# Patient Record
Sex: Male | Born: 1940 | Race: Black or African American | Hispanic: No | Smoking: Former smoker
Health system: Southern US, Community
[De-identification: ages and names within clinical notes are randomized; demographics above are authoritative.]

## PROBLEM LIST (undated history)

## (undated) DIAGNOSIS — E785 Hyperlipidemia, unspecified: Secondary | ICD-10-CM

## (undated) DIAGNOSIS — E119 Type 2 diabetes mellitus without complications: Secondary | ICD-10-CM

## (undated) DIAGNOSIS — C61 Malignant neoplasm of prostate: Secondary | ICD-10-CM

## (undated) DIAGNOSIS — A048 Other specified bacterial intestinal infections: Secondary | ICD-10-CM

## (undated) DIAGNOSIS — I517 Cardiomegaly: Secondary | ICD-10-CM

## (undated) DIAGNOSIS — Z923 Personal history of irradiation: Secondary | ICD-10-CM

## (undated) DIAGNOSIS — M25519 Pain in unspecified shoulder: Secondary | ICD-10-CM

## (undated) DIAGNOSIS — K469 Unspecified abdominal hernia without obstruction or gangrene: Secondary | ICD-10-CM

## (undated) DIAGNOSIS — I10 Essential (primary) hypertension: Secondary | ICD-10-CM

## (undated) DIAGNOSIS — N259 Disorder resulting from impaired renal tubular function, unspecified: Secondary | ICD-10-CM

## (undated) HISTORY — DX: Pain in unspecified shoulder: M25.519

## (undated) HISTORY — DX: Cardiomegaly: I51.7

## (undated) HISTORY — DX: Essential (primary) hypertension: I10

## (undated) HISTORY — DX: Disorder resulting from impaired renal tubular function, unspecified: N25.9

## (undated) HISTORY — DX: Hyperlipidemia, unspecified: E78.5

## (undated) HISTORY — DX: Unspecified abdominal hernia without obstruction or gangrene: K46.9

---
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
## 1993-06-20 HISTORY — PX: OTHER SURGICAL HISTORY: SHX169

## 1997-06-20 HISTORY — PX: FOOT SURGERY: SHX648

## 2001-08-21 ENCOUNTER — Encounter: Payer: Self-pay | Admitting: General Surgery

## 2001-08-21 ENCOUNTER — Ambulatory Visit (HOSPITAL_COMMUNITY): Admission: RE | Admit: 2001-08-21 | Discharge: 2001-08-21 | Payer: Self-pay | Admitting: General Surgery

## 2003-05-06 HISTORY — PX: OTHER SURGICAL HISTORY: SHX169

## 2006-07-18 ENCOUNTER — Ambulatory Visit: Payer: Self-pay | Admitting: Internal Medicine

## 2006-07-18 LAB — CONVERTED CEMR LAB
ALT: 33 units/L (ref 0–40)
AST: 32 units/L (ref 0–37)
Albumin: 4.3 g/dL (ref 3.5–5.2)
Alkaline Phosphatase: 72 units/L (ref 39–117)
BUN: 16 mg/dL (ref 6–23)
Bacteria, UA: NEGATIVE
Basophils Absolute: 0.1 10*3/uL (ref 0.0–0.1)
Basophils Relative: 0.6 % (ref 0.0–1.0)
Bilirubin Urine: NEGATIVE
Bilirubin, Direct: 0.1 mg/dL (ref 0.0–0.3)
CO2: 28 meq/L (ref 19–32)
Calcium: 9.7 mg/dL (ref 8.4–10.5)
Chloride: 108 meq/L (ref 96–112)
Cholesterol: 232 mg/dL (ref 0–200)
Creatinine, Ser: 1.3 mg/dL (ref 0.4–1.5)
Direct LDL: 164.3 mg/dL
Eosinophils Absolute: 0.2 10*3/uL (ref 0.0–0.6)
Eosinophils Relative: 2.1 % (ref 0.0–5.0)
GFR calc Af Amer: 71 mL/min
GFR calc non Af Amer: 59 mL/min
Glucose, Bld: 96 mg/dL (ref 70–99)
HCT: 45.9 % (ref 39.0–52.0)
HDL: 42.6 mg/dL (ref >39.0)
Hemoglobin: 15.9 g/dL (ref 13.0–17.0)
Ketones, ur: NEGATIVE mg/dL
Leukocytes, UA: NEGATIVE
Lymphocytes Relative: 23 % (ref 12.0–46.0)
MCHC: 34.6 g/dL (ref 30.0–36.0)
MCV: 93.5 fL (ref 78.0–100.0)
Monocytes Absolute: 0.8 10*3/uL — ABNORMAL HIGH (ref 0.2–0.7)
Monocytes Relative: 7.8 % (ref 3.0–11.0)
Mucus, UA: NEGATIVE
Neutro Abs: 7.1 10*3/uL (ref 1.4–7.7)
Neutrophils Relative %: 66.5 % (ref 43.0–77.0)
Nitrite: NEGATIVE
PSA: 2.35 ng/mL (ref 0.10–4.00)
Platelets: 252 10*3/uL (ref 150–400)
Potassium: 4.6 meq/L (ref 3.5–5.1)
RBC: 4.91 M/uL (ref 4.22–5.81)
RDW: 13.4 % (ref 11.5–14.6)
Sodium: 142 meq/L (ref 135–145)
Specific Gravity, Urine: >=1.03 (ref 1.000–1.03)
TSH: 1.17 microintl units/mL (ref 0.35–5.50)
Total Bilirubin: 0.6 mg/dL (ref 0.3–1.2)
Total CHOL/HDL Ratio: 5.4
Total Protein, Urine: NEGATIVE mg/dL
Total Protein: 7.1 g/dL (ref 6.0–8.3)
Triglycerides: 144 mg/dL (ref 0–149)
Urine Glucose: NEGATIVE mg/dL
Urobilinogen, UA: 0.2 (ref 0.0–1.0)
VLDL: 29 mg/dL (ref 0–40)
WBC: 10.7 10*3/uL — ABNORMAL HIGH (ref 4.5–10.5)
pH: 5 (ref 5.0–8.0)

## 2006-09-04 ENCOUNTER — Ambulatory Visit (HOSPITAL_COMMUNITY): Admission: RE | Admit: 2006-09-04 | Discharge: 2006-09-04 | Payer: Self-pay | Admitting: Endocrinology

## 2006-09-04 ENCOUNTER — Ambulatory Visit: Payer: Self-pay | Admitting: Endocrinology

## 2007-03-05 ENCOUNTER — Encounter: Payer: Self-pay | Admitting: Endocrinology

## 2007-03-05 DIAGNOSIS — I10 Essential (primary) hypertension: Secondary | ICD-10-CM

## 2007-03-05 DIAGNOSIS — E785 Hyperlipidemia, unspecified: Secondary | ICD-10-CM

## 2007-03-05 DIAGNOSIS — K469 Unspecified abdominal hernia without obstruction or gangrene: Secondary | ICD-10-CM

## 2007-03-05 HISTORY — DX: Essential (primary) hypertension: I10

## 2007-03-05 HISTORY — DX: Hyperlipidemia, unspecified: E78.5

## 2007-03-05 HISTORY — DX: Unspecified abdominal hernia without obstruction or gangrene: K46.9

## 2008-04-28 ENCOUNTER — Encounter: Payer: Self-pay | Admitting: Endocrinology

## 2008-04-28 ENCOUNTER — Ambulatory Visit: Payer: Self-pay | Admitting: Endocrinology

## 2008-04-28 DIAGNOSIS — R9431 Abnormal electrocardiogram [ECG] [EKG]: Secondary | ICD-10-CM

## 2008-05-06 ENCOUNTER — Encounter: Payer: Self-pay | Admitting: Endocrinology

## 2008-05-06 ENCOUNTER — Ambulatory Visit: Payer: Self-pay

## 2008-05-08 ENCOUNTER — Ambulatory Visit: Payer: Self-pay | Admitting: Endocrinology

## 2008-05-08 DIAGNOSIS — I517 Cardiomegaly: Secondary | ICD-10-CM

## 2008-05-08 DIAGNOSIS — M25519 Pain in unspecified shoulder: Secondary | ICD-10-CM

## 2008-05-08 HISTORY — DX: Pain in unspecified shoulder: M25.519

## 2008-05-08 HISTORY — DX: Cardiomegaly: I51.7

## 2008-05-11 LAB — CONVERTED CEMR LAB
ALT: 29 units/L (ref 0–53)
AST: 24 units/L (ref 0–37)
Albumin: 4.5 g/dL (ref 3.5–5.2)
Alkaline Phosphatase: 64 units/L (ref 39–117)
BUN: 28 mg/dL — ABNORMAL HIGH (ref 6–23)
Basophils Absolute: 0 10*3/uL (ref 0.0–0.1)
Basophils Relative: 0.3 % (ref 0.0–3.0)
Bilirubin Urine: NEGATIVE
Bilirubin, Direct: 0.1 mg/dL (ref 0.0–0.3)
CO2: 29 meq/L (ref 19–32)
Calcium: 10.3 mg/dL (ref 8.4–10.5)
Chloride: 103 meq/L (ref 96–112)
Cholesterol: 253 mg/dL (ref 0–200)
Creatinine, Ser: 2 mg/dL — ABNORMAL HIGH (ref 0.4–1.5)
Crystals: NEGATIVE
Direct LDL: 152.2 mg/dL
Eosinophils Absolute: 0.2 10*3/uL (ref 0.0–0.7)
Eosinophils Relative: 2.6 % (ref 0.0–5.0)
GFR calc Af Amer: 43 mL/min
GFR calc non Af Amer: 36 mL/min
Glucose, Bld: 104 mg/dL — ABNORMAL HIGH (ref 70–99)
HCT: 47.8 % (ref 39.0–52.0)
HDL: 40.8 mg/dL (ref >39.0)
Hemoglobin: 16.5 g/dL (ref 13.0–17.0)
Ketones, ur: NEGATIVE mg/dL
Leukocytes, UA: NEGATIVE
Lymphocytes Relative: 31.8 % (ref 12.0–46.0)
MCHC: 34.4 g/dL (ref 30.0–36.0)
MCV: 94.6 fL (ref 78.0–100.0)
Monocytes Absolute: 0.6 10*3/uL (ref 0.1–1.0)
Monocytes Relative: 7.6 % (ref 3.0–12.0)
Neutro Abs: 5 10*3/uL (ref 1.4–7.7)
Neutrophils Relative %: 57.7 % (ref 43.0–77.0)
Nitrite: NEGATIVE
PSA: 3.73 ng/mL (ref 0.10–4.00)
Platelets: 237 10*3/uL (ref 150–400)
Potassium: 5.1 meq/L (ref 3.5–5.1)
RBC: 5.05 M/uL (ref 4.22–5.81)
RDW: 12.9 % (ref 11.5–14.6)
Sodium: 142 meq/L (ref 135–145)
Specific Gravity, Urine: >=1.03 (ref 1.000–1.03)
TSH: 1.47 microintl units/mL (ref 0.35–5.50)
Total Bilirubin: 0.8 mg/dL (ref 0.3–1.2)
Total CHOL/HDL Ratio: 6.2
Total Protein, Urine: NEGATIVE mg/dL
Total Protein: 7.4 g/dL (ref 6.0–8.3)
Triglycerides: 189 mg/dL — ABNORMAL HIGH (ref 0–149)
Urine Glucose: NEGATIVE mg/dL
Urobilinogen, UA: 0.2 (ref 0.0–1.0)
VLDL: 38 mg/dL (ref 0–40)
WBC: 8.5 10*3/uL (ref 4.5–10.5)
pH: 5.5 (ref 5.0–8.0)

## 2008-05-22 ENCOUNTER — Ambulatory Visit: Payer: Self-pay | Admitting: Endocrinology

## 2008-05-22 DIAGNOSIS — N259 Disorder resulting from impaired renal tubular function, unspecified: Secondary | ICD-10-CM

## 2008-05-22 HISTORY — DX: Disorder resulting from impaired renal tubular function, unspecified: N25.9

## 2008-05-22 LAB — CONVERTED CEMR LAB
BUN: 24 mg/dL — ABNORMAL HIGH (ref 6–23)
CO2: 29 meq/L (ref 19–32)
Calcium: 9.9 mg/dL (ref 8.4–10.5)
Chloride: 107 meq/L (ref 96–112)
Creatinine, Ser: 1.7 mg/dL — ABNORMAL HIGH (ref 0.4–1.5)
GFR calc Af Amer: 52 mL/min
GFR calc non Af Amer: 43 mL/min
Glucose, Bld: 120 mg/dL — ABNORMAL HIGH (ref 70–99)
Potassium: 5.1 meq/L (ref 3.5–5.1)
Sodium: 141 meq/L (ref 135–145)

## 2008-06-05 ENCOUNTER — Telehealth (INDEPENDENT_AMBULATORY_CARE_PROVIDER_SITE_OTHER): Payer: Self-pay | Admitting: *Deleted

## 2008-06-26 ENCOUNTER — Ambulatory Visit: Payer: Self-pay | Admitting: Internal Medicine

## 2008-07-10 ENCOUNTER — Encounter: Payer: Self-pay | Admitting: Internal Medicine

## 2008-07-10 ENCOUNTER — Ambulatory Visit: Payer: Self-pay | Admitting: Internal Medicine

## 2008-07-10 LAB — HM COLONOSCOPY: HM Colonoscopy: NORMAL

## 2008-07-12 ENCOUNTER — Encounter: Payer: Self-pay | Admitting: Internal Medicine

## 2010-07-20 NOTE — Miscellaneous (Signed)
Summary: DIR COLS CR-AGE.Marland KitchenEM  Clinical Lists Changes  Medications: Added new medication of MOVIPREP 100 GM  SOLR (PEG-KCL-NACL-NASULF-NA ASC-C) As per prep instructions. - Signed Rx of MOVIPREP 100 GM  SOLR (PEG-KCL-NACL-NASULF-NA ASC-C) As per prep instructions.;  #1 x 0;  Signed;  Entered by: Laverna Peace RN;  Authorized by: Irene Shipper MD;  Method used: Electronically to [facility] [address] [place], [place], [place], [place]  [address], Ph: [phone], Fax: [phone] Observations: Added new observation of ALLERGY REV: Done (06/26/2008 10:13) Added new observation of NKA: T (06/26/2008 10:13)    Prescriptions: MOVIPREP 100 GM  SOLR (PEG-KCL-NACL-NASULF-NA ASC-C) As per prep instructions.  #1 x 0   Entered by:   Laverna Peace RN   Authorized by:   Irene Shipper MD   Signed by:   Laverna Peace RN on 06/26/2008   Method used:   Electronically to        [facility] (retail)       [address] [place], [place]  [address]       Ph: [phone]       Fax: [phone]   RxID:   [id number]

## 2010-08-16 ENCOUNTER — Other Ambulatory Visit: Payer: Self-pay | Admitting: Endocrinology

## 2010-08-16 ENCOUNTER — Encounter: Payer: Self-pay | Admitting: Endocrinology

## 2010-08-16 ENCOUNTER — Ambulatory Visit (INDEPENDENT_AMBULATORY_CARE_PROVIDER_SITE_OTHER)
Admission: RE | Admit: 2010-08-16 | Discharge: 2010-08-16 | Disposition: A | Discharge status: Home / Self Care | Payer: Medicare Other | Source: Ambulatory Visit | Attending: Endocrinology | Admitting: Endocrinology

## 2010-08-16 ENCOUNTER — Ambulatory Visit (INDEPENDENT_AMBULATORY_CARE_PROVIDER_SITE_OTHER): Payer: Medicare Other | Admitting: Endocrinology

## 2010-08-16 ENCOUNTER — Other Ambulatory Visit: Payer: Medicare Other

## 2010-08-16 DIAGNOSIS — N259 Disorder resulting from impaired renal tubular function, unspecified: Secondary | ICD-10-CM

## 2010-08-16 DIAGNOSIS — E7800 Pure hypercholesterolemia, unspecified: Secondary | ICD-10-CM

## 2010-08-16 DIAGNOSIS — E785 Hyperlipidemia, unspecified: Secondary | ICD-10-CM

## 2010-08-16 DIAGNOSIS — R05 Cough: Secondary | ICD-10-CM

## 2010-08-16 DIAGNOSIS — I10 Essential (primary) hypertension: Secondary | ICD-10-CM

## 2010-08-16 DIAGNOSIS — I517 Cardiomegaly: Secondary | ICD-10-CM

## 2010-08-16 DIAGNOSIS — Z125 Encounter for screening for malignant neoplasm of prostate: Secondary | ICD-10-CM

## 2010-08-16 DIAGNOSIS — Z79899 Other long term (current) drug therapy: Secondary | ICD-10-CM

## 2010-08-16 LAB — CBC WITH DIFFERENTIAL/PLATELET
Eosinophils Relative: 2.4 % (ref 0.0–5.0)
HCT: 45.7 % (ref 39.0–52.0)
Lymphocytes Relative: 26.9 % (ref 12.0–46.0)
Monocytes Relative: 7.3 % (ref 3.0–12.0)
Neutrophils Relative %: 63.1 % (ref 43.0–77.0)
Platelets: 268 10*3/uL (ref 150.0–400.0)
WBC: 9 10*3/uL (ref 4.5–10.5)

## 2010-08-16 LAB — HEPATIC FUNCTION PANEL
ALT: 29 U/L (ref 0–53)
AST: 26 U/L (ref 0–37)
Alkaline Phosphatase: 61 U/L (ref 39–117)
Bilirubin, Direct: 0.1 mg/dL (ref 0.0–0.3)
Total Bilirubin: 0.6 mg/dL (ref 0.3–1.2)

## 2010-08-16 LAB — URINALYSIS, ROUTINE W REFLEX MICROSCOPIC
Ketones, ur: NEGATIVE
Leukocytes, UA: NEGATIVE
Nitrite: NEGATIVE
Specific Gravity, Urine: >=1.03 (ref 1.000–1.030)
Urine Glucose: NEGATIVE
Urobilinogen, UA: 0.2 (ref 0.0–1.0)
pH: 5.5 (ref 5.0–8.0)

## 2010-08-16 LAB — BASIC METABOLIC PANEL
BUN: 18 mg/dL (ref 6–23)
Calcium: 9.7 mg/dL (ref 8.4–10.5)
Creatinine, Ser: 1.5 mg/dL (ref 0.4–1.5)
GFR: 60.86 mL/min (ref >60.00)
Potassium: 4.9 mEq/L (ref 3.5–5.1)

## 2010-08-16 LAB — LIPID PANEL
HDL: 41.1 mg/dL (ref >39.00)
VLDL: 35 mg/dL (ref 0.0–40.0)

## 2010-08-16 IMAGING — CR DG CHEST 2V
2 series · 2 of 2 positions shown · non-contrast
Comparison: None.

CLINICAL DATA: Cough.  Ex-smoker.

CHEST - 2 VIEW

[view not recorded (1 of 2)]
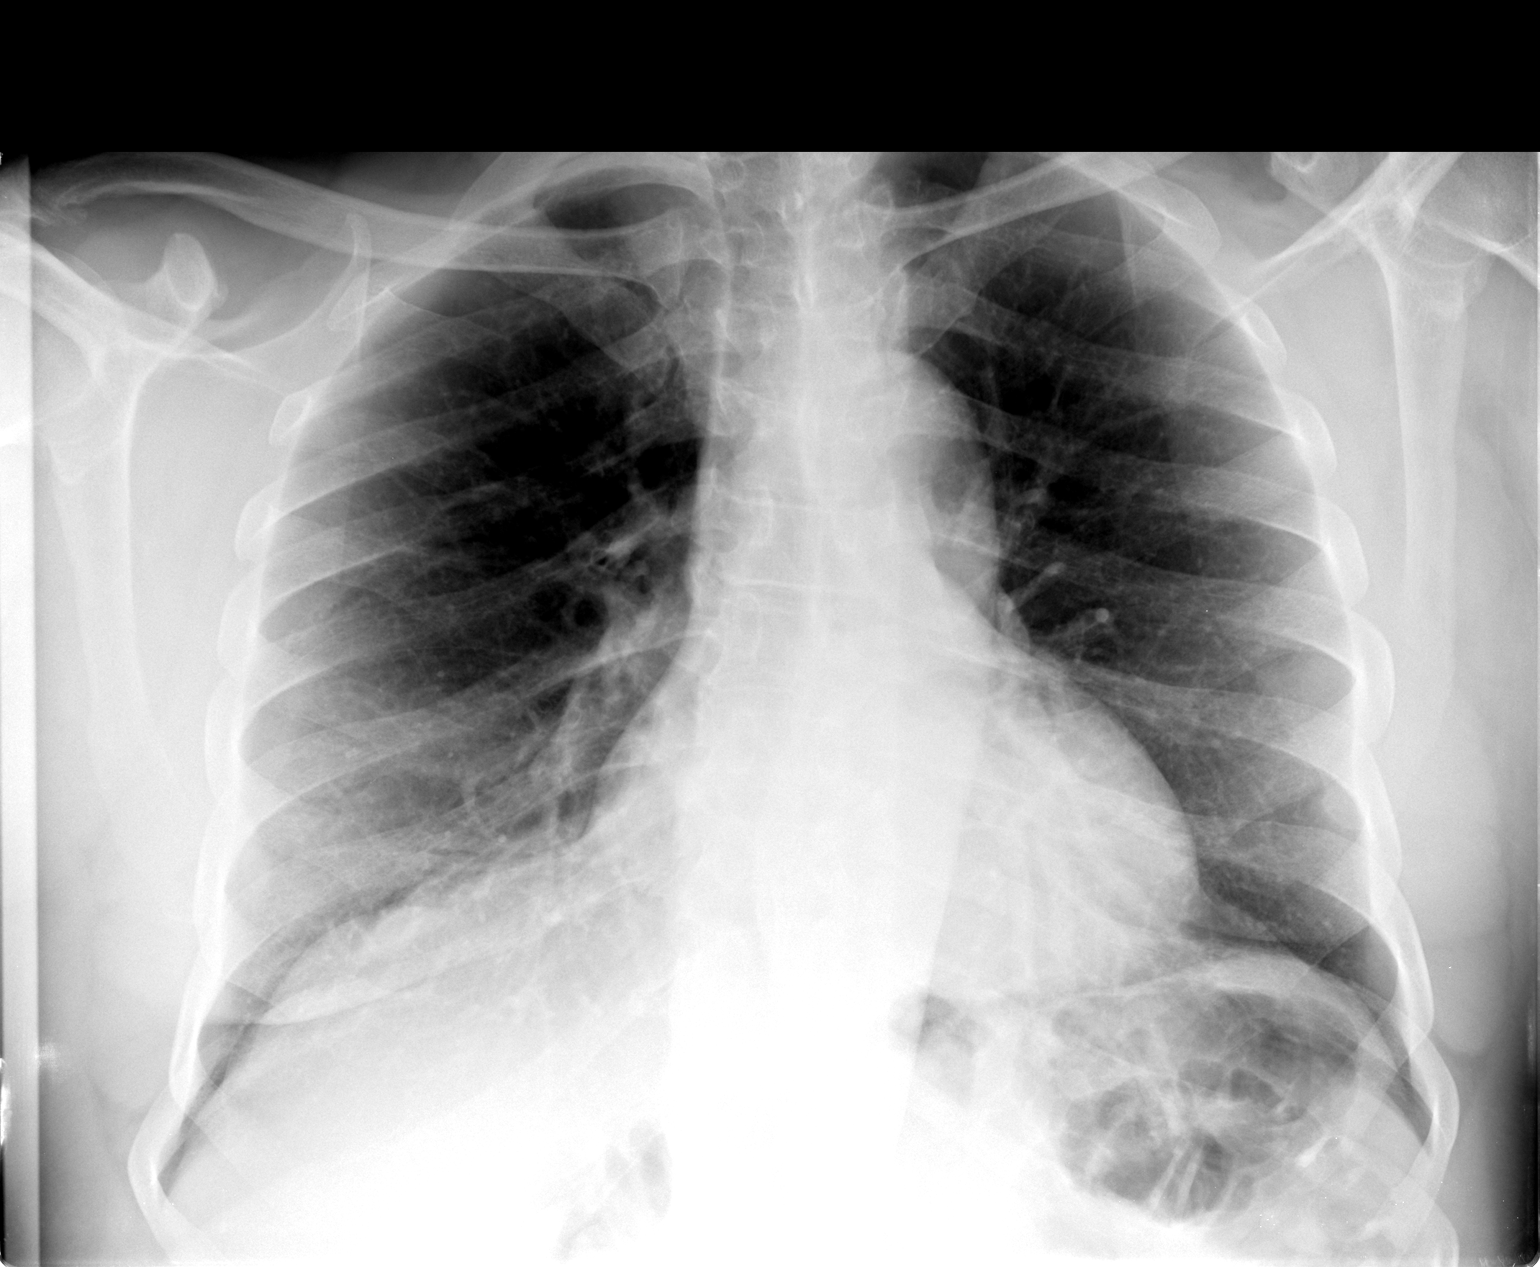

[view not recorded (2 of 2)]
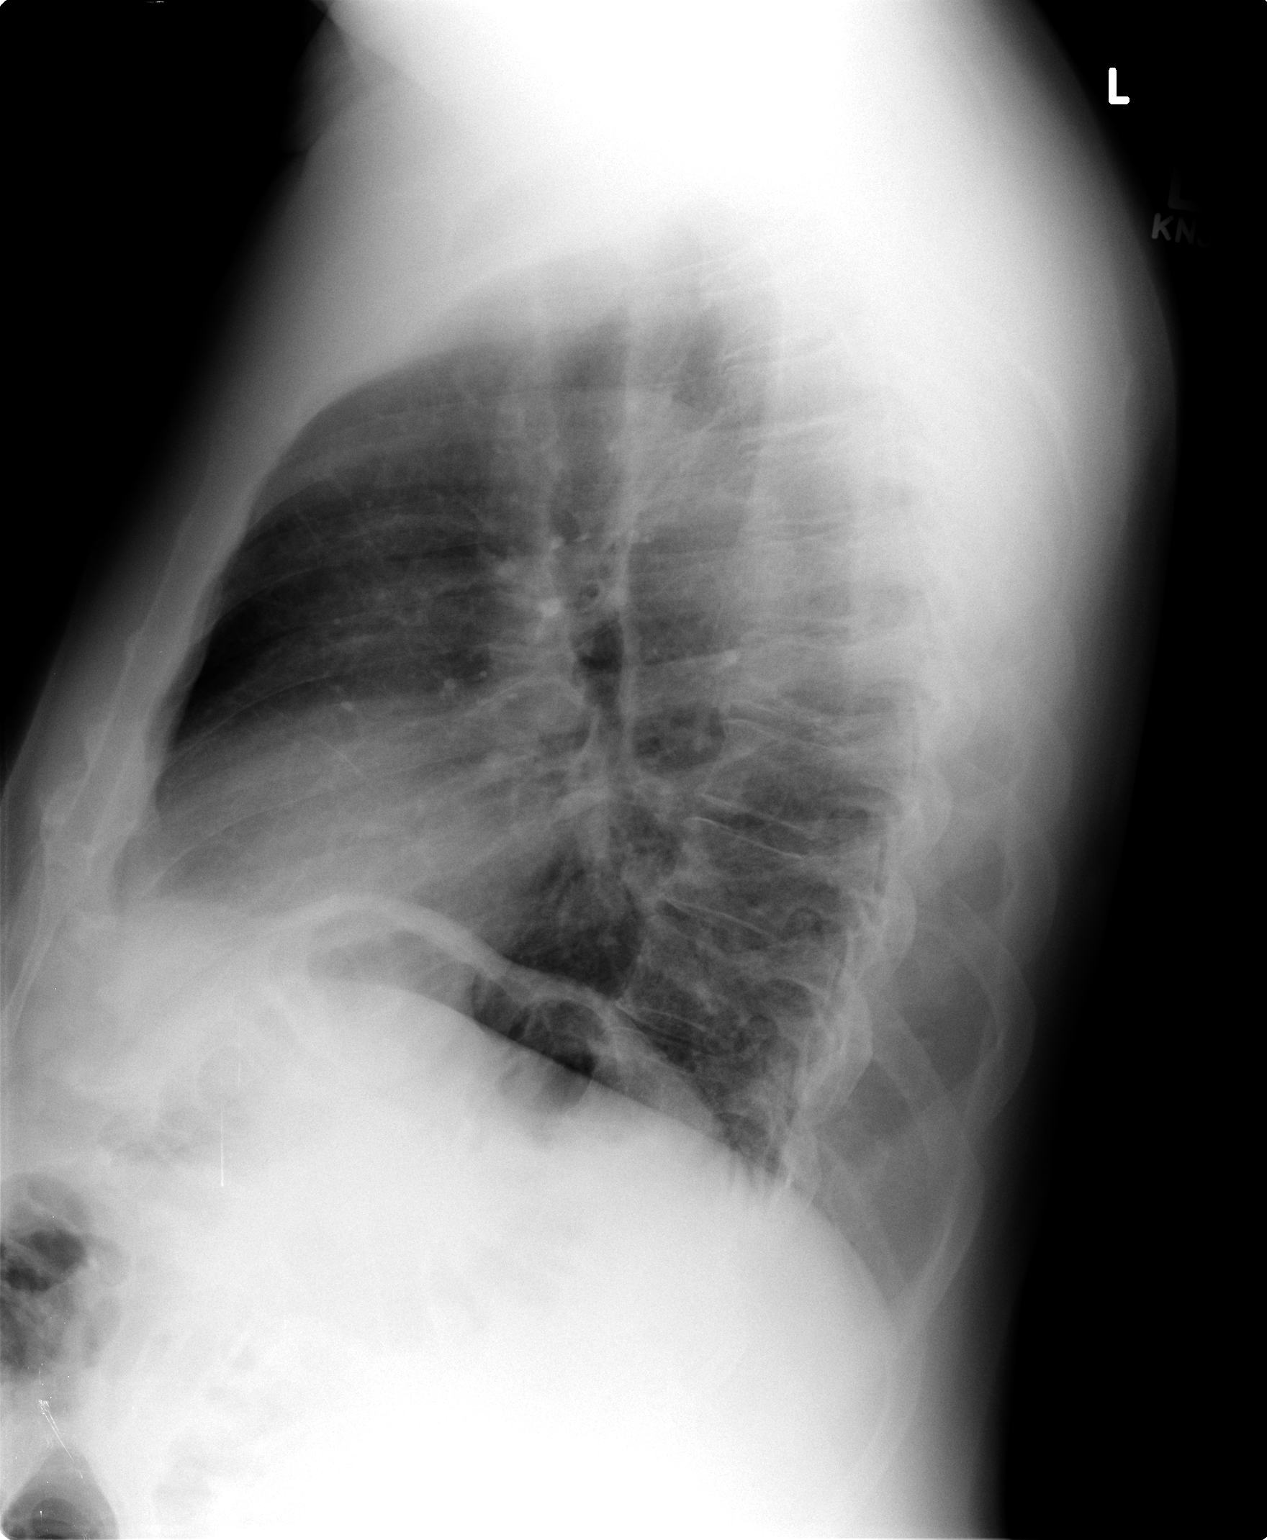

[2 of 2 positions shown; findings below may reference images not displayed]

FINDINGS: Midline trachea.  Normal heart size and mediastinal
contours for age.  No pleural effusion or pneumothorax. Mild volume
loss at the lung bases.
IMPRESSION: No acute cardiopulmonary disease.

## 2010-08-24 ENCOUNTER — Other Ambulatory Visit: Payer: Medicare Other

## 2010-08-31 NOTE — Assessment & Plan Note (Signed)
Summary: cough x2 wks   Vital Signs:  Patient profile:   70 year old male Height:      69 inches (175.26 cm) Weight:      203.13 pounds (92.33 kg) BMI:     30.11 O2 Sat:      97 % on Room air Temp:     98.8 degrees F (37.11 degrees C) oral Pulse rate:   68 / minute Pulse rhythm:   regular BP sitting:   124 / 84  (left arm) Cuff size:   large  Vitals Entered By: Rebeca Alert CMA Deborra Medina) (August 16, 2010 9:19 AM)  O2 Flow:  Room air CC: Productive cough with yellowish mucus x 2 weeks/aj Is Patient Diabetic? No Comments Pt states he is currently not taking any medications   CC:  Productive cough with yellowish mucus x 2 weeks/aj.  History of Present Illness: 2 weeks of now-mild cough in the chest, but no assoc sob.   he stopped zocor, but not due to any side-effect.  Current Medications (verified): 1)  Lisinopril-Hydrochlorothiazide 10-12.5 Mg Tabs (Lisinopril-Hydrochlorothiazide) .... Take 1/2 By Mouth Qd 2)  Zocor 80 Mg Tabs (Simvastatin) .... 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 retired married  Review of Systems  The patient denies fever and chest pain.    Physical Exam  Head:  head: no deformity eyes: no periorbital swelling, no proptosis external nose and ears are normal mouth: no lesion seen Lungs:  Clear to auscultation bilaterally. Normal respiratory effort.  Additional Exam:  Cholesterol LDL     143.8 mg/dL UDip w/Micro (URINE)   Color                     LT.  YELLOW       RANGE:  Yellow;Lt. Yellow   Clarity                   CLEAR                       Clear   Specific Gravity          >=1.030                     1.000 - 1.030   Urine Ph                  5.5                         5.0-8.0   Protein                   NEGATIVE                    Negative   Urine Glucose             NEGATIVE                    Negative   Ketones                   NEGATIVE  Negative   Urine Bilirubin           NEGATIVE                    Negative   Blood                     MODERATE                    Negative   Urobilinogen              0.2                         0.0 - 1.0   Leukocyte Esterace        NEGATIVE                    Negative   Nitrite                   NEGATIVE                    Negative   Urine WBC                 3-6/hpf                     0-2/hpf   Urine RBC                 3-6/hpf      Impression & Recommendations:  Problem # 1:  uri new  Problem # 2:  DYSLIPIDEMIA (ICD-272.4) needs increased rx  Problem # 3:  ? hematuria new  Medications Added to Medication List This Visit: 1)  Promethazine-codeine 6.25-10 Mg/77ml Syrp (Promethazine-codeine) .Marland Kitchen.. 1 teaspoon every 4 hrs as needed for cough 2)  Simvastatin 80 Mg Tabs (Simvastatin) .Marland Kitchen.. 1 tab at bedtime  Other Orders: T-2 View CXR (71020TC) TLB-Lipid Panel (80061-LIPID) TLB-BMP (Basic Metabolic Panel-BMET) (99991111) TLB-CBC Platelet - w/Differential (85025-CBCD) TLB-Hepatic/Liver Function Pnl (80076-HEPATIC) TLB-TSH (Thyroid Stimulating Hormone) (84443-TSH) TLB-PSA (Prostate Specific Antigen) (84153-PSA) TLB-Udip w/ Micro (81001-URINE) Est. Patient Level IV [id number]  Patient Instructions: 1)  chest x-ray and blood tests are being ordered for you today.  please call [phone] to hear your test results. 2)  here is a precsription for cough syrup. 3)  please make an appointment for a regular physical soon. 4)  (update: i left message on phone-tree:  resume  zocor 80 mg at bedtime.  come in for repeat ua 599.7) Prescriptions: SIMVASTATIN 80 MG TABS (SIMVASTATIN) 1 tab at bedtime  #30 x 11   Entered and Authorized by:   Donavan Foil MD   Signed by:   Donavan Foil MD on 08/16/2010   Method used:   Electronically to        [facility] (retail)       [address] [place], [place]  [address]       Ph: [phone]       Fax: [phone]   RxID:   [id number] Keener 6.25-10 MG/5ML SYRP (PROMETHAZINE-CODEINE) 1 teaspoon every 4 hrs as needed for cough  #8 oz x 1   Entered and Authorized by:   Donavan Foil MD   Signed by:   Donavan Foil MD on 08/16/2010   Method used:   Print then  Give to Patient   RxID:   [id number]    Orders Added: 1)  T-2 View CXR [71020TC] 2)  TLB-Lipid Panel [80061-LIPID] 3)  TLB-BMP (Basic Metabolic Panel-BMET) 123456 4)  TLB-CBC Platelet - w/Differential [85025-CBCD] 5)  TLB-Hepatic/Liver Function Pnl [80076-HEPATIC] 6)  TLB-TSH (Thyroid Stimulating Hormone) [84443-TSH] 7)  TLB-PSA (Prostate Specific Antigen) [84153-PSA] 8)  TLB-Udip w/ Micro [81001-URINE] 9)  Est. Patient Level IV [id number]

## 2010-10-15 ENCOUNTER — Ambulatory Visit (INDEPENDENT_AMBULATORY_CARE_PROVIDER_SITE_OTHER): Payer: Medicare Other | Admitting: Endocrinology

## 2010-10-15 ENCOUNTER — Encounter: Payer: Self-pay | Admitting: Endocrinology

## 2010-10-15 VITALS — BP 128/76 | HR 68 | Temp 98.3°F | Ht 68.0 in | Wt 202.0 lb

## 2010-10-15 DIAGNOSIS — I10 Essential (primary) hypertension: Secondary | ICD-10-CM

## 2010-10-15 DIAGNOSIS — R9431 Abnormal electrocardiogram [ECG] [EKG]: Secondary | ICD-10-CM

## 2010-10-15 DIAGNOSIS — Z0000 Encounter for general adult medical examination without abnormal findings: Secondary | ICD-10-CM

## 2010-10-15 NOTE — Patient Instructions (Signed)
please consider these measures for your health:  minimize alcohol.  do not use tobacco products.  have a colonoscopy at least every 10 years from age 70.  keep firearms safely stored.  always use seat belts.  have working smoke alarms in your home.  see an eye doctor and dentist regularly.  never drive under the influence of alcohol or drugs (including prescription drugs).   please let me know what your wishes would be, if artificial life support measures should become necessary.  it is critically important to prevent falling down (keep floor areas well-lit, dry, and free of loose objects) Please return in 1 year Patient are always advised to take medication as prescribed.

## 2010-10-15 NOTE — Progress Notes (Signed)
  Subjective:    Patient ID: John Fry, male    DOB: [birth year], 70 y.o.   MRN: [id number]  HPI here for regular wellness examination.  He's feeling pretty well in general, and says chronic med probs are stable, except as noted below. Past Medical History  Diagnosis Date  . RENAL INSUFFICIENCY 05/22/2008  . SHOULDER PAIN, RIGHT 05/08/2008  . VENTRICULAR HYPERTROPHY, LEFT 05/08/2008  . DYSLIPIDEMIA 03/05/2007  . HERNIA 03/05/2007  . HYPERTENSION 03/05/2007   Past Surgical History  Procedure Date  . Foot surgery 1999    Right Foot Bunion Repair  . Lower arterial 05/06/2003    reports that he quit smoking about 4 years ago. He does not have any smokeless tobacco history on file. His alcohol and drug histories not on file. Retired Married Etoh:  None Smoking: quit 2008 family history is negative for Hypertension and Cancer. Allergies no known allergies   Review of Systems  Constitutional: Negative for fever.       Weight gain  HENT: Negative for hearing loss.   Gastrointestinal: Negative for blood in stool.  Genitourinary: Negative for hematuria.  Musculoskeletal: Negative for arthralgias.       Occasional right shoulder pain  Skin: Negative for rash.  Neurological: Negative for syncope and headaches.  Hematological: Does not bruise/bleed easily.  Psychiatric/Behavioral: Negative for dysphoric mood. The patient is not nervous/anxious.        Objective:   Physical Exam VS: see vs page GEN: no distress HEAD: head: no deformity eyes: no periorbital swelling, no proptosis external nose and ears are normal mouth: no lesion seen NECK: supple, thyroid is not enlarged CHEST WALL: no deformity BREASTS:  No gynecomastia CV: reg rate and rhythm, no murmur ABD: abdomen is soft, nontender.  no hepatosplenomegaly.  not distended.  no hernia. GENITALIA:  Normal male.   RECTAL: normal external and internal exam.  heme neg. PROSTATE:  Normal size.  No  nodule MUSCULOSKELETAL: muscle bulk and strength are grossly normal.  no obvious joint swelling.  gait is normal and steady EXTEMITIES: no deformity.  no ulcer on the feet.  feet are of normal color and temp.  no edema NEURO:  cn 2-12 grossly intact.   readily moves all 4's.  sensation is intact to touch on the feet SKIN:  Normal texture and temperature.  No rash or suspicious lesion is visible.   NODES:  None palpable at the neck PSYCH: alert, oriented x3.  Does not appear anxious nor depressed.     SEPARATE EVALUATION FOLLOWS--EACH PROBLEM HERE IS NEW, NOT RESPONDING TO TREATMENT, OR POSES SIGNIFICANT RISK TO THE PATIENT'S HEALTH: HISTORY OF THE PRESENT ILLNESS: Pt says he intermitently takes his zocor ecg is noted to be abnormal today.  Denies chest pain PAST MEDICAL HISTORY reviewed and up to date today REVIEW OF SYSTEMS: Denies sob PHYSICAL EXAMINATION: Vs:  Se vs page LUNGS:  Clear to auscultation Pulses: dorsalis pedis intact bilat.  no carotid bruit LAB/XRAY RESULTS: i reviewed ecg and lipids IMPRESSION: Abnormal ecg, new problem Dyslipidemia, therapy limited by noncompliance.  i'll do the best i can. PLAN: See instruction page   Assessment & Plan:  Wellness visit today, with problems stable, except as noted.

## 2010-10-25 ENCOUNTER — Other Ambulatory Visit (HOSPITAL_COMMUNITY): Payer: Medicare Other | Admitting: Radiology

## 2010-11-05 ENCOUNTER — Ambulatory Visit (HOSPITAL_COMMUNITY): Payer: Medicare Other | Attending: Endocrinology | Admitting: Radiology

## 2010-11-05 DIAGNOSIS — I059 Rheumatic mitral valve disease, unspecified: Secondary | ICD-10-CM | POA: Insufficient documentation

## 2010-11-05 DIAGNOSIS — E785 Hyperlipidemia, unspecified: Secondary | ICD-10-CM | POA: Insufficient documentation

## 2010-11-05 DIAGNOSIS — I079 Rheumatic tricuspid valve disease, unspecified: Secondary | ICD-10-CM | POA: Insufficient documentation

## 2010-11-05 DIAGNOSIS — R9431 Abnormal electrocardiogram [ECG] [EKG]: Secondary | ICD-10-CM | POA: Insufficient documentation

## 2010-11-05 DIAGNOSIS — I10 Essential (primary) hypertension: Secondary | ICD-10-CM | POA: Insufficient documentation

## 2011-02-28 ENCOUNTER — Other Ambulatory Visit: Payer: Self-pay | Admitting: Endocrinology

## 2011-03-08 ENCOUNTER — Other Ambulatory Visit: Payer: Self-pay | Admitting: Endocrinology

## 2011-03-08 NOTE — Telephone Encounter (Signed)
please advise on BP med-medication removed from med list in EMR at 08/16/2010 OV

## 2011-03-08 NOTE — Telephone Encounter (Signed)
Called pt at home number listed above-no answer-unable to leave message

## 2011-03-08 NOTE — Telephone Encounter (Signed)
please call patient: Is he off it or on it now?

## 2011-03-09 NOTE — Telephone Encounter (Signed)
Called pt no answer unable to leave message

## 2011-03-11 NOTE — Telephone Encounter (Signed)
Unable to reach pt at home # listed above-no answer-unable to leave message.

## 2011-03-28 ENCOUNTER — Telehealth: Payer: Self-pay | Admitting: Endocrinology

## 2011-03-28 MED ORDER — LISINOPRIL-HYDROCHLOROTHIAZIDE 10-12.5 MG PO TABS: ORAL_TABLET | ORAL | Status: DC

## 2011-03-28 NOTE — Telephone Encounter (Signed)
Pt wants refill of Lisinopril-HCTZ 10-12.5mg  sent to [facility]. Per pt, he has been taking this medication. (See 03/08/2011 refill encounter)

## 2011-03-28 NOTE — Telephone Encounter (Signed)
Please add to med list, and refill prn

## 2011-03-28 NOTE — Telephone Encounter (Signed)
Rx sent to pharmacy, pt informed.

## 2011-03-28 NOTE — Telephone Encounter (Signed)
Called pt at number listed above, no answer, unable to leave message.

## 2011-03-28 NOTE — Telephone Encounter (Signed)
PT CALLED [facility] TO REFILL HIS BP MEDS.  HE IS OUT.  HE DOES NOT KNOW THE NAME OF THE MEDICATION.

## 2011-10-24 ENCOUNTER — Other Ambulatory Visit: Payer: Self-pay | Admitting: Endocrinology

## 2011-10-28 ENCOUNTER — Ambulatory Visit (INDEPENDENT_AMBULATORY_CARE_PROVIDER_SITE_OTHER): Payer: Medicare Other | Admitting: Endocrinology

## 2011-10-28 ENCOUNTER — Encounter: Payer: Self-pay | Admitting: Endocrinology

## 2011-10-28 ENCOUNTER — Other Ambulatory Visit (INDEPENDENT_AMBULATORY_CARE_PROVIDER_SITE_OTHER): Payer: Medicare Other

## 2011-10-28 VITALS — BP 142/98 | HR 73 | Temp 97.5°F | Ht 68.0 in | Wt 201.0 lb

## 2011-10-28 DIAGNOSIS — Z125 Encounter for screening for malignant neoplasm of prostate: Secondary | ICD-10-CM

## 2011-10-28 DIAGNOSIS — E785 Hyperlipidemia, unspecified: Secondary | ICD-10-CM

## 2011-10-28 DIAGNOSIS — N259 Disorder resulting from impaired renal tubular function, unspecified: Secondary | ICD-10-CM

## 2011-10-28 DIAGNOSIS — Z79899 Other long term (current) drug therapy: Secondary | ICD-10-CM

## 2011-10-28 DIAGNOSIS — I10 Essential (primary) hypertension: Secondary | ICD-10-CM

## 2011-10-28 LAB — BASIC METABOLIC PANEL
CO2: 25 mEq/L (ref 19–32)
Calcium: 9.6 mg/dL (ref 8.4–10.5)
Creatinine, Ser: 1.5 mg/dL (ref 0.4–1.5)
Glucose, Bld: 113 mg/dL — ABNORMAL HIGH (ref 70–99)

## 2011-10-28 LAB — CBC WITH DIFFERENTIAL/PLATELET
Basophils Relative: 0.5 % (ref 0.0–3.0)
HCT: 45.8 % (ref 39.0–52.0)
Hemoglobin: 15.2 g/dL (ref 13.0–17.0)
Lymphocytes Relative: 28 % (ref 12.0–46.0)
MCHC: 33.3 g/dL (ref 30.0–36.0)
Monocytes Relative: 8.5 % (ref 3.0–12.0)
Neutro Abs: 4.6 10*3/uL (ref 1.4–7.7)
RBC: 4.81 Mil/uL (ref 4.22–5.81)

## 2011-10-28 LAB — LIPID PANEL
Cholesterol: 179 mg/dL (ref 0–200)
HDL: 44.1 mg/dL (ref >39.00)
Triglycerides: 130 mg/dL (ref 0.0–149.0)

## 2011-10-28 LAB — URINALYSIS, ROUTINE W REFLEX MICROSCOPIC
Ketones, ur: NEGATIVE
Specific Gravity, Urine: 1.025 (ref 1.000–1.030)
Urine Glucose: NEGATIVE
pH: 5.5 (ref 5.0–8.0)

## 2011-10-28 LAB — HEPATIC FUNCTION PANEL
Albumin: 4.2 g/dL (ref 3.5–5.2)
Bilirubin, Direct: 0 mg/dL (ref 0.0–0.3)
Total Protein: 7 g/dL (ref 6.0–8.3)

## 2011-10-28 MED ORDER — HALOBETASOL PROPIONATE 0.05 % EX CREA: TOPICAL_CREAM | Freq: Three times a day (TID) | CUTANEOUS | Status: AC | PRN

## 2011-10-28 NOTE — Progress Notes (Signed)
  Subjective:    Patient ID: John Fry, male    DOB: 1940/08/20, 71 y.o.   MRN: [id number]  HPI Pt states 1 week of slight rash on the upper arms, and assoc itching. He is unable to cite precip factor, but he works as a Development worker, international aid.   Pt says he sometimes misses his blood-pressure medication. Past Medical History  Diagnosis Date  . RENAL INSUFFICIENCY 05/22/2008  . SHOULDER PAIN, RIGHT 05/08/2008  . VENTRICULAR HYPERTROPHY, LEFT 05/08/2008  . DYSLIPIDEMIA 03/05/2007  . HERNIA 03/05/2007  . HYPERTENSION 03/05/2007    Past Surgical History  Procedure Date  . Foot surgery 1999    Right Foot Bunion Repair  . Lower arterial 05/06/2003    History   Social History  . Marital Status: Married    Spouse Name: N/A    Number of Children: N/A  . Years of Education: N/A   Occupational History  .      Retired   Social History Main Topics  . Smoking status: Former Smoker    Quit date: 06/20/2006  . Smokeless tobacco: Not on file  . Alcohol Use: Not on file  . Drug Use: Not on file  . Sexually Active: Not on file   Other Topics Concern  . Not on file   Social History Narrative  . No narrative on file    Current Outpatient Prescriptions on File Prior to Visit  Medication Sig Dispense Refill  . lisinopril-hydrochlorothiazide (ZESTORETIC) 10-12.5 MG per tablet Take 1/2 tablet by mouth daily  30 tablet  3  . simvastatin (ZOCOR) 80 MG tablet TAKE ONE TABLET BY MOUTH AT BEDTIME  30 tablet  3    No Known Allergies  Family History  Problem Relation Age of Onset  . Hypertension Neg Hx   . Cancer Neg Hx     BP 142/98  Pulse 73  Temp(Src) 97.5 F (36.4 C) (Oral)  Ht 5\' 8"  (1.727 m)  Wt 201 lb (91.173 kg)  BMI 30.56 kg/m2  SpO2 98%    Review of Systems Denies fever and sob    Objective:   Physical Exam VITAL SIGNS:  See vs page GENERAL: no distress Skin, upper arms:  Slight eczematous rash.   Lab Results  Component Value Date   WBC 7.8 10/28/2011   HGB  15.2 10/28/2011   HCT 45.8 10/28/2011   PLT 219.0 10/28/2011   GLUCOSE 113* 10/28/2011   CHOL 179 10/28/2011   TRIG 130.0 10/28/2011   HDL 44.10 10/28/2011   LDLDIRECT 143.8 08/16/2010   LDLCALC 109* 10/28/2011   ALT 30 10/28/2011   AST 31 10/28/2011   NA 139 10/28/2011   K 4.6 10/28/2011   CL 106 10/28/2011   CREATININE 1.5 10/28/2011   BUN 20 10/28/2011   CO2 25 10/28/2011   TSH 1.05 10/28/2011   PSA 3.43 10/28/2011      Assessment & Plan:  Rash, new, uncertain etiology HTN, needs increased rx Dyslipidemia, needs increased rx. Noncomplicance.  This causes high risk to his health

## 2011-10-28 NOTE — Patient Instructions (Addendum)
i have sent a prescription to your pharmacy, for an anti-itch skin cream. blood tests are being requested for you today.   Please come in soon for a regular physical. Please always  take your medications as prescribed.  Loratadine (non-prescription) will help your itching.

## 2011-10-31 LAB — PTH, INTACT AND CALCIUM
Calcium, Total (PTH): 10 mg/dL (ref 8.4–10.5)
PTH: 55.5 pg/mL (ref 14.0–72.0)

## 2011-11-16 ENCOUNTER — Ambulatory Visit (INDEPENDENT_AMBULATORY_CARE_PROVIDER_SITE_OTHER): Payer: Medicare Other | Admitting: Endocrinology

## 2011-11-16 ENCOUNTER — Encounter: Payer: Self-pay | Admitting: Endocrinology

## 2011-11-16 VITALS — BP 132/98 | HR 74 | Temp 98.1°F | Ht 67.0 in | Wt 204.0 lb

## 2011-11-16 DIAGNOSIS — Z0000 Encounter for general adult medical examination without abnormal findings: Secondary | ICD-10-CM

## 2011-11-16 DIAGNOSIS — I10 Essential (primary) hypertension: Secondary | ICD-10-CM

## 2011-11-16 DIAGNOSIS — R9431 Abnormal electrocardiogram [ECG] [EKG]: Secondary | ICD-10-CM

## 2011-11-16 MED ORDER — LISINOPRIL-HYDROCHLOROTHIAZIDE 10-12.5 MG PO TABS: 1.0000 | ORAL_TABLET | Freq: Every day | ORAL | Status: DC

## 2011-11-16 NOTE — Patient Instructions (Addendum)
Let's check a "treadmill" test.  you will receive a phone call, about a day and time for an appointment Increase the lisinopril-hct to 1 pill per day.  i have sent a prescription to your pharmacy.   please consider these measures for your health:  minimize alcohol.  do not use tobacco products.  have a colonoscopy at least every 10 years from age 71. keep firearms safely stored.  always use seat belts.  have working smoke alarms in your home.  see an eye doctor and dentist regularly.  never drive under the influence of alcohol or drugs (including prescription drugs).   please let me know what your wishes would be, if artificial life support measures should become necessary.  it is critically important to prevent falling down (keep floor areas well-lit, dry, and free of loose objects.  If you have a cane, walker, or wheelchair, you should use it, even for short trips around the house.  Also, try not to rush). Please return in 1 year.   (update:  Pt requests full code)

## 2011-11-16 NOTE — Progress Notes (Signed)
Subjective:    Patient ID: John Fry, male    DOB: 1940/08/28, 71 y.o.   MRN: [id number]  HPI here for regular wellness examination.  He's feeling pretty well in general, and says chronic med probs are stable, except as noted below Past Medical History  Diagnosis Date  . RENAL INSUFFICIENCY 05/22/2008  . SHOULDER PAIN, RIGHT 05/08/2008  . VENTRICULAR HYPERTROPHY, LEFT 05/08/2008  . DYSLIPIDEMIA 03/05/2007  . HERNIA 03/05/2007  . HYPERTENSION 03/05/2007    Past Surgical History  Procedure Date  . Foot surgery 1999    Right Foot Bunion Repair  . Lower arterial 05/06/2003    History   Social History  . Marital Status: Married    Spouse Name: N/A    Number of Children: N/A  . Years of Education: N/A   Occupational History  .      Retired   Social History Main Topics  . Smoking status: Former Smoker    Quit date: 06/20/2006  . Smokeless tobacco: Not on file  . Alcohol Use: Not on file  . Drug Use: Not on file  . Sexually Active: Not on file   Other Topics Concern  . Not on file   Social History Narrative  . No narrative on file    Current Outpatient Prescriptions on File Prior to Visit  Medication Sig Dispense Refill  . halobetasol (ULTRAVATE) 0.05 % cream Apply topically 3 (three) times daily as needed. For rash  50 g  1  . lisinopril-hydrochlorothiazide (PRINZIDE,ZESTORETIC) 10-12.5 MG per tablet Take 1 tablet by mouth daily.  30 tablet  11  . simvastatin (ZOCOR) 80 MG tablet TAKE ONE TABLET BY MOUTH AT BEDTIME  30 tablet  3    No Known Allergies  Family History  Problem Relation Age of Onset  . Hypertension Neg Hx   . Cancer Neg Hx     BP 132/98  Pulse 74  Temp(Src) 98.1 F (36.7 C) (Oral)  Ht 5\' 7"  (1.702 m)  Wt 204 lb (92.534 kg)  BMI 31.95 kg/m2  SpO2 97%     Review of Systems  Constitutional: Negative for fever and unexpected weight change.  HENT: Negative for hearing loss.   Eyes: Negative for visual disturbance.    Respiratory: Negative for shortness of breath.   Cardiovascular: Negative for leg swelling.  Gastrointestinal: Negative for anal bleeding.  Genitourinary: Negative for hematuria and difficulty urinating.  Musculoskeletal: Negative for back pain.  Skin: Negative for rash.  Neurological: Negative for syncope and numbness.  Hematological: Does not bruise/bleed easily.  Psychiatric/Behavioral:       Mild depression       Objective:   Physical Exam VS: see vs page GEN: no distress HEAD: head: no deformity eyes: no periorbital swelling, no proptosis external nose and ears are normal mouth: no lesion seen NECK: supple, thyroid is not enlarged CHEST WALL: no deformity LUNGS: clear to auscultation BREASTS:  No gynecomastia ABD: abdomen is soft, nontender.  no hepatosplenomegaly.  not distended.  no hernia. RECTAL: normal external and internal exam.  heme neg. PROSTATE:  Normal size.  No nodule MUSCULOSKELETAL: muscle bulk and strength are grossly normal.  no obvious joint swelling.  gait is normal and steady EXTEMITIES: no deformity.  no ulcer on the feet.  feet are of normal color and temp.  no edema PULSES: dorsalis pedis intact bilat.  no carotid bruit NEURO:  cn 2-12 grossly intact.   readily moves all 4's.  sensation is intact to touch  on the feet SKIN:  Normal texture and temperature.  No rash or suspicious lesion is visible.   NODES:  None palpable at the neck PSYCH: alert, oriented x3.  Does not appear anxious nor depressed.    Assessment & Plan:  Wellness visit today, with problems stable, except as noted.   SEPARATE EVALUATION FOLLOWS--EACH PROBLEM HERE IS NEW, NOT RESPONDING TO TREATMENT, OR POSES SIGNIFICANT RISK TO THE PATIENT'S HEALTH: HISTORY OF THE PRESENT ILLNESS: Pt takes zestoretic as rx'ed.  He is noted to have an abnormal ecg today.  pt states he feels well in general. PAST MEDICAL HISTORY reviewed and up to date today REVIEW OF SYSTEMS: Denies chest  pain PHYSICAL EXAMINATION: VITAL SIGNS:  See vs page GENERAL: no distress HEART:  Regular rate and rhythm without murmurs noted. Normal S1,S2.   LAB/XRAY RESULTS: i reviewed electrocardiogram IMPRESSION: HTN.  needs increased rx Abnormal ecg, uncertain etiology PLAN: See instruction page

## 2011-12-05 ENCOUNTER — Encounter: Payer: Self-pay | Admitting: Physician Assistant

## 2011-12-05 ENCOUNTER — Ambulatory Visit (INDEPENDENT_AMBULATORY_CARE_PROVIDER_SITE_OTHER): Payer: Medicare Other | Admitting: Physician Assistant

## 2011-12-05 DIAGNOSIS — R9431 Abnormal electrocardiogram [ECG] [EKG]: Secondary | ICD-10-CM

## 2011-12-05 NOTE — Procedures (Signed)
John Fry is a 71 y.o. male ex-smoker with hx of HTN, HL referred by PCP for ETT due to abnormal ECG.  No FHx of premature CAD.  Patient denies recent hx of CP, SOB, syncope.  Exam unremarkable.  ECG with non-specific ST-T changes.    Exercise Treadmill Test  Pre-Exercise Testing Evaluation Rhythm: normal sinus  Rate: 66   PR:  .16 QRS:  .09  QT:  .40 QTc: .42     Test  Exercise Tolerance Test Ordering MD: Renato Shin , MD  Interpreting MD: Richardson Dopp PA-C  Unique Test No: 1  Treadmill:  1  Indication for ETT: Abnormal EKG  Contraindication to ETT: No   Stress Modality: exercise - treadmill  Cardiac Imaging Performed: non   Protocol: standard Bruce - maximal  Max BP:  206/112  Max MPHR (bpm):  149 85% MPR (bpm):  126  MPHR obtained (bpm):  127 % MPHR obtained:  85%  Reached 85% MPHR (min:sec):  5:11 Total Exercise Time (min-sec):  5:20  Workload in METS:  7.0 Borg Scale: 13  Reason ETT Terminated:  exaggerated hypertensive response    ST Segment Analysis At Rest: normal ST segments - no evidence of significant ST depression With Exercise: non-specific ST changes  Other Information Arrhythmia:  No Angina during ETT:  absent (0) Quality of ETT:  diagnostic  ETT Interpretation:  normal - no evidence of ischemia by ST analysis  Comments: Fair exercise tolerance. No chest pain. Patient did complain of leg pain with exercise. Hypertensive BP response to exercise.  Test stopped after achieving target HR due to elevated BP. Patient did take medication this am - 1 hour before test. No significant ST-T changes to suggest ischemia.   Recommendations: Follow up with PCP for BP control. Advised patient to take second Lisinopril/HCTZ when he returns home. Needs to call PCP for early follow up on BP. Richardson Dopp, PA-C  9:53 AM 12/05/2011

## 2012-04-09 ENCOUNTER — Other Ambulatory Visit: Payer: Self-pay | Admitting: Endocrinology

## 2012-09-06 ENCOUNTER — Other Ambulatory Visit: Payer: Self-pay

## 2012-09-06 MED ORDER — SIMVASTATIN 80 MG PO TABS: ORAL_TABLET | ORAL | Status: DC

## 2012-11-16 ENCOUNTER — Encounter: Payer: Medicare Other | Admitting: Endocrinology

## 2012-11-16 DIAGNOSIS — Z0289 Encounter for other administrative examinations: Secondary | ICD-10-CM

## 2012-12-18 ENCOUNTER — Other Ambulatory Visit: Payer: Self-pay | Admitting: *Deleted

## 2012-12-18 MED ORDER — LISINOPRIL-HYDROCHLOROTHIAZIDE 10-12.5 MG PO TABS: 1.0000 | ORAL_TABLET | Freq: Every day | ORAL | Status: DC

## 2013-01-29 ENCOUNTER — Other Ambulatory Visit: Payer: Self-pay

## 2013-03-13 ENCOUNTER — Telehealth: Payer: Self-pay | Admitting: Endocrinology

## 2013-03-13 NOTE — Telephone Encounter (Signed)
Pt was advised he needs to make a f/u appt pt hung up the phone,has not been seen since may 2013

## 2013-04-03 ENCOUNTER — Ambulatory Visit (INDEPENDENT_AMBULATORY_CARE_PROVIDER_SITE_OTHER): Payer: Medicare Other | Admitting: Endocrinology

## 2013-04-03 VITALS — BP 130/70 | HR 73 | Wt 208.0 lb

## 2013-04-03 DIAGNOSIS — Z79899 Other long term (current) drug therapy: Secondary | ICD-10-CM

## 2013-04-03 DIAGNOSIS — Z125 Encounter for screening for malignant neoplasm of prostate: Secondary | ICD-10-CM

## 2013-04-03 DIAGNOSIS — N259 Disorder resulting from impaired renal tubular function, unspecified: Secondary | ICD-10-CM

## 2013-04-03 DIAGNOSIS — I10 Essential (primary) hypertension: Secondary | ICD-10-CM

## 2013-04-03 DIAGNOSIS — R972 Elevated prostate specific antigen [PSA]: Secondary | ICD-10-CM

## 2013-04-03 DIAGNOSIS — E785 Hyperlipidemia, unspecified: Secondary | ICD-10-CM

## 2013-04-03 LAB — URINALYSIS, ROUTINE W REFLEX MICROSCOPIC
Bilirubin Urine: NEGATIVE
Ketones, ur: NEGATIVE
Leukocytes, UA: NEGATIVE
Specific Gravity, Urine: >=1.03 (ref 1.000–1.030)
Urobilinogen, UA: 0.2 (ref 0.0–1.0)

## 2013-04-03 MED ORDER — LISINOPRIL-HYDROCHLOROTHIAZIDE 10-12.5 MG PO TABS: 1.0000 | ORAL_TABLET | Freq: Every day | ORAL | Status: DC

## 2013-04-03 MED ORDER — SIMVASTATIN 80 MG PO TABS: ORAL_TABLET | ORAL | Status: DC

## 2013-04-03 NOTE — Patient Instructions (Signed)
please consider these measures for your health:  minimize alcohol.  do not use tobacco products.  have a colonoscopy at least every 10 years from age 72.  keep firearms safely stored.  always use seat belts.  have working smoke alarms in your home.  see an eye doctor and dentist regularly.  never drive under the influence of alcohol or drugs (including prescription drugs).   blood tests are being requested for you today.  We'll contact you with results. Please return in 1 year.

## 2013-04-03 NOTE — Progress Notes (Signed)
Subjective:    Patient ID: John Fry, male    DOB: 12/14/1940, 72 y.o.   MRN: [id number]  HPI Pt is here for regular wellness examination, and is feeling pretty well in general, and says chronic med probs are stable, except as noted below Past Medical History  Diagnosis Date  . RENAL INSUFFICIENCY 05/22/2008  . SHOULDER PAIN, RIGHT 05/08/2008  . VENTRICULAR HYPERTROPHY, LEFT 05/08/2008  . DYSLIPIDEMIA 03/05/2007  . HERNIA 03/05/2007  . HYPERTENSION 03/05/2007    Past Surgical History  Procedure Laterality Date  . Foot surgery  1999    Right Foot Bunion Repair  . Lower arterial  05/06/2003    History   Social History  . Marital Status: Married    Spouse Name: N/A    Number of Children: N/A  . Years of Education: N/A   Occupational History  .      Retired   Social History Main Topics  . Smoking status: Former Smoker    Quit date: 06/20/2006  . Smokeless tobacco: Not on file  . Alcohol Use: Not on file  . Drug Use: Not on file  . Sexual Activity: Not on file   Other Topics Concern  . Not on file   Social History Narrative  . No narrative on file   No current outpatient prescriptions on file prior to visit.   No current facility-administered medications on file prior to visit.   No Known Allergies  Family History  Problem Relation Age of Onset  . Hypertension Neg Hx   . Cancer Neg Hx    BP 130/70  Pulse 73  Wt 208 lb (94.348 kg)  BMI 32.57 kg/m2  SpO2 98%  Review of Systems  Constitutional: Negative for fever.  HENT: Negative for hearing loss.   Eyes: Negative for visual disturbance.  Respiratory: Negative for cough.   Gastrointestinal: Negative for anal bleeding.  Endocrine: Negative for cold intolerance.  Genitourinary: Negative for hematuria.  Musculoskeletal: Negative for back pain.  Skin: Negative for rash.  Allergic/Immunologic: Negative for environmental allergies.  Neurological: Negative for syncope.  Hematological: Does not  bruise/bleed easily.  Psychiatric/Behavioral: Negative for dysphoric mood.      Objective:   Physical Exam VS: see vs page GEN: no distress HEAD: head: no deformity eyes: no periorbital swelling, no proptosis external nose and ears are normal mouth: no lesion seen NECK: supple, thyroid is not enlarged CHEST WALL: no deformity LUNGS: clear to auscultation BREASTS:  No gynecomastia ABD: abdomen is soft, nontender.  no hepatosplenomegaly.  not distended.  no hernia RECTAL: normal external and internal exam.  heme neg. PROSTATE:  Normal size.  No nodule MUSCULOSKELETAL: muscle bulk and strength are grossly normal.  no obvious joint swelling.  gait is normal and steady EXTEMITIES: no deformity.  no ulcer on the feet.  feet are of normal color and temp.  no edema PULSES: dorsalis pedis intact bilat.  no carotid bruit NEURO:  cn 2-12 grossly intact.   readily moves all 4's.  sensation is intact to touch on the feet SKIN:  Normal texture and temperature.  No rash or suspicious lesion is visible.   NODES:  None palpable at the neck PSYCH: alert, oriented x3.  Does not appear anxious nor depressed.        Assessment & Plan:  Wellness visit today, with problems stable, except as noted. we discussed code status.  pt requests full code, but would not want to be started or maintained on artificial life-support  measures if there was not a reasonable chance of recovery.     SEPARATE EVALUATION FOLLOWS--EACH PROBLEM HERE IS NEW, NOT RESPONDING TO TREATMENT, OR POSES SIGNIFICANT RISK TO THE PATIENT'S HEALTH: HISTORY OF THE PRESENT ILLNESS: The state of at least three ongoing medical problems is addressed today, with interval history of each noted here: Elevated creat is again noted.  He denies edema elevated PSA is noted today.  He denies decreased urinary stream. Elevated LDL is also noted.  He denies chest pain PAST MEDICAL HISTORY reviewed and up to date today REVIEW OF SYSTEMS: Denies  weight loss and sob PHYSICAL EXAMINATION: VITAL SIGNS:  See vs page GENERAL: no distress HEART:  Regular rate and rhythm without murmurs noted. Normal S1,S2.   LAB/XRAY RESULTS: Lab Results  Component Value Date   CREATININE 1.8* 04/03/2013   BUN 24* 04/03/2013   NA 141 04/03/2013   K 4.0 04/03/2013   CL 106 04/03/2013   CO2 26 04/03/2013   Lab Results  Component Value Date   CHOL 215* 04/03/2013   HDL 39.20 04/03/2013   LDLCALC 109* 10/28/2011   LDLDIRECT 121.7 04/03/2013   TRIG 493.0 Triglyceride is over 400; calculations on Lipids are invalid.* 04/03/2013   CHOLHDL 5 04/03/2013   Lab Results  Component Value Date   PSA 4.17* 04/03/2013   PSA 3.43 10/28/2011   PSA 3.53 08/16/2010  IMPRESSION: Elevated PSA, new Dyslipidemia: he needs increased rx Renal insuff, slightly worse, but this has fluctuated PLAN: See instruction page

## 2013-04-04 DIAGNOSIS — R972 Elevated prostate specific antigen [PSA]: Secondary | ICD-10-CM | POA: Insufficient documentation

## 2013-04-04 LAB — CBC WITH DIFFERENTIAL/PLATELET
Basophils Absolute: 0.1 10*3/uL (ref 0.0–0.1)
Basophils Relative: 0.6 % (ref 0.0–3.0)
Eosinophils Relative: 3.6 % (ref 0.0–5.0)
HCT: 42.3 % (ref 39.0–52.0)
Hemoglobin: 14.6 g/dL (ref 13.0–17.0)
Lymphocytes Relative: 25.5 % (ref 12.0–46.0)
Lymphs Abs: 2.1 10*3/uL (ref 0.7–4.0)
Monocytes Absolute: 0.8 10*3/uL (ref 0.1–1.0)
Monocytes Relative: 9.1 % (ref 3.0–12.0)
Neutro Abs: 5.1 10*3/uL (ref 1.4–7.7)
RBC: 4.58 Mil/uL (ref 4.22–5.81)
RDW: 13.8 % (ref 11.5–14.6)
WBC: 8.4 10*3/uL (ref 4.5–10.5)

## 2013-04-04 LAB — LIPID PANEL
HDL: 39.2 mg/dL (ref >39.00)
Total CHOL/HDL Ratio: 5
VLDL: 98.6 mg/dL — ABNORMAL HIGH (ref 0.0–40.0)

## 2013-04-04 LAB — BASIC METABOLIC PANEL
BUN: 24 mg/dL — ABNORMAL HIGH (ref 6–23)
Chloride: 106 mEq/L (ref 96–112)
GFR: 48.76 mL/min — ABNORMAL LOW (ref >60.00)
Glucose, Bld: 107 mg/dL — ABNORMAL HIGH (ref 70–99)
Potassium: 4 mEq/L (ref 3.5–5.1)

## 2013-04-04 LAB — HEPATIC FUNCTION PANEL
ALT: 24 U/L (ref 0–53)
AST: 25 U/L (ref 0–37)
Albumin: 4.2 g/dL (ref 3.5–5.2)
Bilirubin, Direct: 0.1 mg/dL (ref 0.0–0.3)
Total Bilirubin: 0.2 mg/dL — ABNORMAL LOW (ref 0.3–1.2)

## 2013-04-04 LAB — TSH: TSH: 0.9 u[IU]/mL (ref 0.35–5.50)

## 2013-04-04 LAB — LDL CHOLESTEROL, DIRECT: Direct LDL: 121.7 mg/dL

## 2013-04-04 LAB — PSA: PSA: 4.17 ng/mL — ABNORMAL HIGH (ref 0.10–4.00)

## 2013-04-05 ENCOUNTER — Other Ambulatory Visit: Payer: Self-pay

## 2013-05-21 ENCOUNTER — Other Ambulatory Visit (HOSPITAL_COMMUNITY): Payer: Self-pay | Admitting: Urology

## 2013-05-21 DIAGNOSIS — N281 Cyst of kidney, acquired: Secondary | ICD-10-CM

## 2013-06-18 ENCOUNTER — Ambulatory Visit (HOSPITAL_COMMUNITY)
Admission: RE | Admit: 2013-06-18 | Discharge: 2013-06-18 | Disposition: A | Discharge status: Home / Self Care | Payer: Medicare Other | Source: Ambulatory Visit | Attending: Urology | Admitting: Urology

## 2013-06-18 ENCOUNTER — Ambulatory Visit (HOSPITAL_COMMUNITY): Admission: RE | Admit: 2013-06-18 | Payer: Medicare Other | Source: Ambulatory Visit

## 2013-06-18 DIAGNOSIS — Z87448 Personal history of other diseases of urinary system: Secondary | ICD-10-CM | POA: Insufficient documentation

## 2013-06-18 DIAGNOSIS — N281 Cyst of kidney, acquired: Secondary | ICD-10-CM | POA: Insufficient documentation

## 2013-06-18 DIAGNOSIS — D1779 Benign lipomatous neoplasm of other sites: Secondary | ICD-10-CM | POA: Insufficient documentation

## 2013-06-18 DIAGNOSIS — K8020 Calculus of gallbladder without cholecystitis without obstruction: Secondary | ICD-10-CM | POA: Insufficient documentation

## 2013-06-18 DIAGNOSIS — K7689 Other specified diseases of liver: Secondary | ICD-10-CM | POA: Insufficient documentation

## 2013-06-18 LAB — POCT I-STAT CREATININE: Creatinine, Ser: 1.8 mg/dL — ABNORMAL HIGH (ref 0.50–1.35)

## 2013-06-18 MED ORDER — GADOBENATE DIMEGLUMINE 529 MG/ML IV SOLN
20.0000 mL | Freq: Once | INTRAVENOUS | Status: AC | PRN
  Administered 2013-06-18: 20 mL via INTRAVENOUS

## 2013-06-25 ENCOUNTER — Encounter: Payer: Self-pay | Admitting: Internal Medicine

## 2013-06-27 ENCOUNTER — Inpatient Hospital Stay (HOSPITAL_COMMUNITY): Admission: RE | Admit: 2013-06-27 | Payer: Medicare Other | Source: Ambulatory Visit

## 2013-07-23 DIAGNOSIS — C61 Malignant neoplasm of prostate: Secondary | ICD-10-CM

## 2013-07-23 HISTORY — DX: Malignant neoplasm of prostate: C61

## 2013-07-23 HISTORY — PX: PROSTATE BIOPSY: SHX241

## 2013-09-03 ENCOUNTER — Encounter: Payer: Self-pay | Admitting: Radiation Oncology

## 2013-09-03 DIAGNOSIS — C61 Malignant neoplasm of prostate: Secondary | ICD-10-CM | POA: Insufficient documentation

## 2013-09-04 NOTE — Progress Notes (Signed)
GU Location of Tumor / Histology: prostate  If Prostate Cancer, Gleason Score is (3 + 4) and PSA is (5.34 on 05/21/13)  Patient presented 4 months ago with signs/symptoms of: elevated PSA  Biopsies of prostate (if applicable) revealed:  [id number]   Past/Anticipated interventions by urology, if any: antibiotics and repeat PSA  Past/Anticipated interventions by medical oncology, if any: none  Weight changes, if any: no  Bowel/Bladder complaints, if any:  Urinary frequency, urgency, nocturia x 2,  IPSS 8  Nausea/Vomiting, if any: no  Pain issues, if any:  no  SAFETY ISSUES:  Prior radiation? no  Pacemaker/ICD? no  Possible current pregnancy? na  Is the patient on methotrexate? no  Current Complaints / other details:  Married, 1 son, 1 daughter, retired from [employer]

## 2013-09-05 ENCOUNTER — Ambulatory Visit
Admission: RE | Admit: 2013-09-05 | Discharge: 2013-09-05 | Disposition: A | Discharge status: Home / Self Care | Payer: Commercial Managed Care - HMO | Source: Ambulatory Visit | Attending: Radiation Oncology | Admitting: Radiation Oncology

## 2013-09-05 ENCOUNTER — Encounter: Payer: Self-pay | Admitting: Radiation Oncology

## 2013-09-05 VITALS — BP 154/99 | HR 71 | Temp 98.5°F | Resp 20 | Wt 207.7 lb

## 2013-09-05 DIAGNOSIS — C61 Malignant neoplasm of prostate: Secondary | ICD-10-CM | POA: Insufficient documentation

## 2013-09-05 DIAGNOSIS — Z79899 Other long term (current) drug therapy: Secondary | ICD-10-CM | POA: Insufficient documentation

## 2013-09-05 DIAGNOSIS — Z87891 Personal history of nicotine dependence: Secondary | ICD-10-CM | POA: Insufficient documentation

## 2013-09-05 DIAGNOSIS — N529 Male erectile dysfunction, unspecified: Secondary | ICD-10-CM | POA: Insufficient documentation

## 2013-09-05 DIAGNOSIS — E785 Hyperlipidemia, unspecified: Secondary | ICD-10-CM | POA: Insufficient documentation

## 2013-09-05 DIAGNOSIS — I10 Essential (primary) hypertension: Secondary | ICD-10-CM | POA: Insufficient documentation

## 2013-09-05 HISTORY — DX: Malignant neoplasm of prostate: C61

## 2013-09-05 NOTE — Progress Notes (Signed)
Please see the Nurse Progress Note in the MD Initial Consult Encounter for this patient. 

## 2013-09-05 NOTE — Progress Notes (Signed)
[facility] NEW PATIENT EVALUATION  Name: John Fry MRN: [id number]  Date:   09/05/2013           DOB: September 28, 1940  Status: outpatient   CC: John Shin, MD  John Frock, MD    REFERRING PHYSICIAN: Alexis Frock, MD   DIAGNOSIS: Stage TI C. intermediate risk adenocarcinoma prostate   HISTORY OF PRESENT ILLNESS:  John Fry is a 73 y.o. male who is seen today through the courtesy of Dr. Tresa Moore for discussion of possible radiation therapy in the management of his stage TI C. intermediate risk adenocarcinoma prostate. He has had a rising PSA since 2012. His PSA was 3.5 in 2012, 4.17 in October 2014 and most recently 5.34 on 05/21/2013. He underwent ultrasound-guided biopsies by Dr. Tresa Moore on 07/23/2013. His was found have Gleason 7 (3+4) involving 10% of one core from the right lateral base and 20% of one core from the right lateral mid gland. He also had Gleason 6 (3+3) involving 80% of one core from the left apex. His gland volume was approximately 53 cc. He is doing reasonably well from a GU and GI standpoint. His I PSS score is 8. He states that he does have more urinary frequency since his biopsy. Over the past one to 2 weeks he has had mild lower abdominal cramping.  He does have erectile dysfunction and is not sexually active.  PREVIOUS RADIATION THERAPY: No   PAST MEDICAL HISTORY:  has a past medical history of RENAL INSUFFICIENCY (05/22/2008); SHOULDER PAIN, RIGHT (05/08/2008); VENTRICULAR HYPERTROPHY, LEFT (05/08/2008); DYSLIPIDEMIA (03/05/2007); HERNIA (03/05/2007); HYPERTENSION (03/05/2007); and Prostate cancer (07/23/13).     PAST SURGICAL HISTORY:  Past Surgical History  Procedure Laterality Date  . Foot surgery  1999    Right Foot Bunion Repair  . Lower arterial  05/06/2003  . Prostate biopsy  07/23/13    gleason 7, volume 53 ml     FAMILY HISTORY: family history includes Cancer (age of onset: 68) in his father;  Diabetes in his sister; Kidney Stones in his brother. There is no history of Hypertension. His father died of a stroke at age 28. He was apparently diagnosed with prostate cancer in his 51s. His mother died following a heart attack at 62.   SOCIAL HISTORY:  reports that he quit smoking about 7 years ago. He does not have any smokeless tobacco history on file. He reports that he does not drink alcohol or use illicit drugs. Married, 2 children. He retired from [employer] were he worked in the [employer]. He now operates a Production designer, theatre/television/film.   ALLERGIES: Review of patient's allergies indicates no known allergies.   MEDICATIONS:  Current Outpatient Prescriptions  Medication Sig Dispense Refill  . lisinopril-hydrochlorothiazide (PRINZIDE,ZESTORETIC) 10-12.5 MG per tablet Take 1 tablet by mouth daily.  30 tablet  11  . simvastatin (ZOCOR) 80 MG tablet TAKE ONE TABLET BY MOUTH AT BEDTIME  30 tablet  11   No current facility-administered medications for this encounter.     REVIEW OF SYSTEMS:  Pertinent items are noted in HPI.    PHYSICAL EXAM:  weight is 207 lb 11.2 oz (94.212 kg). His oral temperature is 98.5 F (36.9 C). His blood pressure is 154/99 and his pulse is 71. His respiration is 20.   Alert and oriented. Head and neck examination: Grossly unremarkable. Nodes: Without palpable cervical or supraclavicular lymphadenopathy. Chest: Lungs clear. Back: Without spinal or CVA tenderness. Abdomen: Without masses  organomegaly. Genitalia: Unremarkable to inspection. Rectal: The prostate gland is slightly enlarged and is without focal induration or nodularity. I'm unable to fully palpate his base. Extremities: Without edema.   LABORATORY DATA:  Lab Results  Component Value Date   WBC 8.4 04/03/2013   HGB 14.6 04/03/2013   HCT 42.3 04/03/2013   MCV 92.2 04/03/2013   PLT 221.0 04/03/2013   Lab Results  Component Value Date   NA 141 04/03/2013   K 4.0  04/03/2013   CL 106 04/03/2013   CO2 26 04/03/2013   Lab Results  Component Value Date   ALT 24 04/03/2013   AST 25 04/03/2013   ALKPHOS 53 04/03/2013   BILITOT 0.2* 04/03/2013   PSA 5.34 from 05/21/2013   IMPRESSION: Stage TI C. intermediate risk adenocarcinoma prostate. I explained to the patient and his wife that his prognosis is related to his stage, PSA level, and Gleason score. His stage and PSA level are favorable while his Gleason score of 7 is of intermediate favorability. Management options include surgery versus close surveillance/observation versus radiation therapy. Considering his excellent performance status with his father living to be 14, I do not feel that close advanced/observation is ideal. He should be offered potentially curative therapy. Radiation therapy options include seed implantation with or without 5 weeks of external beam radiation therapy or 8 weeks of external beam/IMRT. We discussed the potential acute and late toxicities of radiation therapy. We also talked about the need for placement of 3 gold seed markers for image guidance and our desire to treat him with a comfortably full bladder. He is not anxious about seed implantation, and I feel that he may be best suited for 8 weeks of external beam/IMRT. With either radiation therapy or surgery, he can expect a similar favorable outcome. 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 He can contact me if he wants to proceed with radiation therapy.    PLAN: As discussed above.  I spent 60 minutes minutes face to face with the patient and more than 50% of that time was spent in counseling and/or coordination of care.

## 2013-09-24 ENCOUNTER — Encounter (HOSPITAL_COMMUNITY): Payer: Self-pay | Admitting: Emergency Medicine

## 2013-09-24 ENCOUNTER — Emergency Department (INDEPENDENT_AMBULATORY_CARE_PROVIDER_SITE_OTHER): Payer: Commercial Managed Care - HMO

## 2013-09-24 ENCOUNTER — Inpatient Hospital Stay (HOSPITAL_COMMUNITY)
Admission: EM | Admit: 2013-09-24 | Discharge: 2013-09-27 | DRG: 639 | Disposition: A | Discharge status: Home / Self Care | Payer: Medicare HMO | Attending: Internal Medicine | Admitting: Internal Medicine

## 2013-09-24 ENCOUNTER — Emergency Department (HOSPITAL_COMMUNITY)
Admission: EM | Admit: 2013-09-24 | Discharge: 2013-09-24 | Disposition: A | Discharge status: Nursing Home (Includes ICF) | Payer: Commercial Managed Care - HMO | Source: Home / Self Care | Attending: Emergency Medicine | Admitting: Emergency Medicine

## 2013-09-24 DIAGNOSIS — K5900 Constipation, unspecified: Secondary | ICD-10-CM | POA: Diagnosis present

## 2013-09-24 DIAGNOSIS — K219 Gastro-esophageal reflux disease without esophagitis: Secondary | ICD-10-CM | POA: Diagnosis present

## 2013-09-24 DIAGNOSIS — E1029 Type 1 diabetes mellitus with other diabetic kidney complication: Secondary | ICD-10-CM | POA: Diagnosis present

## 2013-09-24 DIAGNOSIS — N19 Unspecified kidney failure: Secondary | ICD-10-CM

## 2013-09-24 DIAGNOSIS — E785 Hyperlipidemia, unspecified: Secondary | ICD-10-CM | POA: Diagnosis present

## 2013-09-24 DIAGNOSIS — A048 Other specified bacterial intestinal infections: Secondary | ICD-10-CM

## 2013-09-24 DIAGNOSIS — E1110 Type 2 diabetes mellitus with ketoacidosis without coma: Secondary | ICD-10-CM

## 2013-09-24 DIAGNOSIS — E1010 Type 1 diabetes mellitus with ketoacidosis without coma: Principal | ICD-10-CM | POA: Diagnosis present

## 2013-09-24 DIAGNOSIS — R112 Nausea with vomiting, unspecified: Secondary | ICD-10-CM

## 2013-09-24 DIAGNOSIS — N183 Chronic kidney disease, stage 3 unspecified: Secondary | ICD-10-CM

## 2013-09-24 DIAGNOSIS — E1100 Type 2 diabetes mellitus with hyperosmolarity without nonketotic hyperglycemic-hyperosmolar coma (NKHHC): Secondary | ICD-10-CM

## 2013-09-24 DIAGNOSIS — E1101 Type 2 diabetes mellitus with hyperosmolarity with coma: Secondary | ICD-10-CM

## 2013-09-24 DIAGNOSIS — I10 Essential (primary) hypertension: Secondary | ICD-10-CM

## 2013-09-24 DIAGNOSIS — E871 Hypo-osmolality and hyponatremia: Secondary | ICD-10-CM

## 2013-09-24 DIAGNOSIS — R739 Hyperglycemia, unspecified: Secondary | ICD-10-CM

## 2013-09-24 DIAGNOSIS — E860 Dehydration: Secondary | ICD-10-CM

## 2013-09-24 DIAGNOSIS — E875 Hyperkalemia: Secondary | ICD-10-CM

## 2013-09-24 DIAGNOSIS — I129 Hypertensive chronic kidney disease with stage 1 through stage 4 chronic kidney disease, or unspecified chronic kidney disease: Secondary | ICD-10-CM | POA: Diagnosis present

## 2013-09-24 DIAGNOSIS — E1065 Type 1 diabetes mellitus with hyperglycemia: Secondary | ICD-10-CM | POA: Diagnosis present

## 2013-09-24 DIAGNOSIS — Z87891 Personal history of nicotine dependence: Secondary | ICD-10-CM

## 2013-09-24 DIAGNOSIS — C61 Malignant neoplasm of prostate: Secondary | ICD-10-CM

## 2013-09-24 LAB — CBC WITH DIFFERENTIAL/PLATELET
BASOS PCT: 0 % (ref 0–1)
Basophils Absolute: 0 10*3/uL (ref 0.0–0.1)
EOS ABS: 0.1 10*3/uL (ref 0.0–0.7)
Eosinophils Relative: 1 % (ref 0–5)
HEMATOCRIT: 47.1 % (ref 39.0–52.0)
Hemoglobin: 16.9 g/dL (ref 13.0–17.0)
Lymphocytes Relative: 16 % (ref 12–46)
Lymphs Abs: 1.6 10*3/uL (ref 0.7–4.0)
MCH: 32.2 pg (ref 26.0–34.0)
MCHC: 35.9 g/dL (ref 30.0–36.0)
MCV: 89.7 fL (ref 78.0–100.0)
MONO ABS: 0.4 10*3/uL (ref 0.1–1.0)
Monocytes Relative: 4 % (ref 3–12)
Neutro Abs: 8 10*3/uL — ABNORMAL HIGH (ref 1.7–7.7)
Neutrophils Relative %: 79 % — ABNORMAL HIGH (ref 43–77)
Platelets: 252 10*3/uL (ref 150–400)
RBC: 5.25 MIL/uL (ref 4.22–5.81)
RDW: 12.5 % (ref 11.5–15.5)
WBC: 10 10*3/uL (ref 4.0–10.5)

## 2013-09-24 LAB — CBG MONITORING, ED: Glucose-Capillary: >600 mg/dL (ref 70–99)

## 2013-09-24 LAB — COMPREHENSIVE METABOLIC PANEL
ALBUMIN: 4.6 g/dL (ref 3.5–5.2)
ALT: 25 U/L (ref 0–53)
AST: 16 U/L (ref 0–37)
Alkaline Phosphatase: 93 U/L (ref 39–117)
BUN: 50 mg/dL — AB (ref 6–23)
CO2: 22 mEq/L (ref 19–32)
CREATININE: 2.42 mg/dL — AB (ref 0.50–1.35)
Calcium: 10.7 mg/dL — ABNORMAL HIGH (ref 8.4–10.5)
Chloride: 85 mEq/L — ABNORMAL LOW (ref 96–112)
GFR calc Af Amer: 29 mL/min — ABNORMAL LOW (ref >90)
GFR calc non Af Amer: 25 mL/min — ABNORMAL LOW (ref >90)
Glucose, Bld: 897 mg/dL (ref 70–99)
Potassium: 5.6 mEq/L — ABNORMAL HIGH (ref 3.7–5.3)
Sodium: 126 mEq/L — ABNORMAL LOW (ref 137–147)
TOTAL PROTEIN: 8.2 g/dL (ref 6.0–8.3)
Total Bilirubin: 0.5 mg/dL (ref 0.3–1.2)

## 2013-09-24 LAB — BASIC METABOLIC PANEL
BUN: 48 mg/dL — AB (ref 6–23)
CALCIUM: 9.6 mg/dL (ref 8.4–10.5)
CO2: 19 mEq/L (ref 19–32)
Chloride: 89 mEq/L — ABNORMAL LOW (ref 96–112)
Creatinine, Ser: 2.11 mg/dL — ABNORMAL HIGH (ref 0.50–1.35)
GFR calc Af Amer: 34 mL/min — ABNORMAL LOW (ref >90)
GFR calc non Af Amer: 29 mL/min — ABNORMAL LOW (ref >90)
Glucose, Bld: 746 mg/dL (ref 70–99)
POTASSIUM: 7 meq/L — AB (ref 3.7–5.3)
Sodium: 127 mEq/L — ABNORMAL LOW (ref 137–147)

## 2013-09-24 LAB — POCT URINALYSIS DIP (DEVICE)
BILIRUBIN URINE: NEGATIVE
GLUCOSE, UA: 500 mg/dL — AB
Leukocytes, UA: NEGATIVE
Nitrite: NEGATIVE
PH: 5 (ref 5.0–8.0)
Protein, ur: NEGATIVE mg/dL
Urobilinogen, UA: 0.2 mg/dL (ref 0.0–1.0)

## 2013-09-24 LAB — POCT H PYLORI SCREEN: H. PYLORI SCREEN, POC: POSITIVE — AB

## 2013-09-24 LAB — OCCULT BLOOD, POC DEVICE: FECAL OCCULT BLD: NEGATIVE

## 2013-09-24 MED ORDER — HEPARIN SODIUM (PORCINE) 5000 UNIT/ML IJ SOLN
5000.0000 [IU] | Freq: Three times a day (TID) | INTRAMUSCULAR | Status: DC
  Administered 2013-09-25 – 2013-09-27 (×7): 5000 [IU] via SUBCUTANEOUS
  Filled 2013-09-24 (×11): qty 1

## 2013-09-24 MED ORDER — SODIUM CHLORIDE 0.9 % IV SOLN
INTRAVENOUS | Status: DC
  Administered 2013-09-24: 5.4 [IU]/h via INTRAVENOUS
  Filled 2013-09-24: qty 1

## 2013-09-24 MED ORDER — SODIUM CHLORIDE 0.9 % IV SOLN
1000.0000 mL | INTRAVENOUS | Status: DC
  Administered 2013-09-24: 1000 mL via INTRAVENOUS

## 2013-09-24 MED ORDER — DEXTROSE-NACL 5-0.45 % IV SOLN: INTRAVENOUS | Status: DC

## 2013-09-24 MED ORDER — DEXTROSE 50 % IV SOLN: 25.0000 mL | INTRAVENOUS | Status: DC | PRN

## 2013-09-24 MED ORDER — DEXTROSE-NACL 5-0.45 % IV SOLN
INTRAVENOUS | Status: DC
  Administered 2013-09-25: 05:00:00 via INTRAVENOUS

## 2013-09-24 MED ORDER — SODIUM CHLORIDE 0.9 % IV SOLN
INTRAVENOUS | Status: AC
  Administered 2013-09-24: 23:00:00 via INTRAVENOUS

## 2013-09-24 MED ORDER — INSULIN ASPART 100 UNIT/ML ~~LOC~~ SOLN
10.0000 [IU] | Freq: Once | SUBCUTANEOUS | Status: AC
  Administered 2013-09-25: 10 [IU] via INTRAVENOUS

## 2013-09-24 MED ORDER — SODIUM CHLORIDE 0.9 % IV SOLN
INTRAVENOUS | Status: DC
  Filled 2013-09-24: qty 1

## 2013-09-24 MED ORDER — TAMSULOSIN HCL 0.4 MG PO CAPS
0.4000 mg | ORAL_CAPSULE | Freq: Every day | ORAL | Status: DC
  Administered 2013-09-25 – 2013-09-27 (×3): 0.4 mg via ORAL
  Filled 2013-09-24 (×3): qty 1

## 2013-09-24 MED ORDER — SODIUM CHLORIDE 0.9 % IV SOLN
INTRAVENOUS | Status: DC
  Administered 2013-09-24: via INTRAVENOUS

## 2013-09-24 MED ORDER — SODIUM CHLORIDE 0.9 % IV SOLN
1.0000 g | Freq: Once | INTRAVENOUS | Status: AC
  Administered 2013-09-24: 1 g via INTRAVENOUS
  Filled 2013-09-24: qty 10

## 2013-09-24 MED ORDER — SODIUM POLYSTYRENE SULFONATE 15 GM/60ML PO SUSP
30.0000 g | Freq: Once | ORAL | Status: AC
  Administered 2013-09-24: 30 g via ORAL
  Filled 2013-09-24: qty 120

## 2013-09-24 MED ORDER — ONDANSETRON 4 MG PO TBDP
8.0000 mg | ORAL_TABLET | Freq: Once | ORAL | Status: AC
  Administered 2013-09-24: 8 mg via ORAL

## 2013-09-24 MED ORDER — SODIUM CHLORIDE 0.9 % IV SOLN
Freq: Once | INTRAVENOUS | Status: AC
  Administered 2013-09-24: 20:00:00 via INTRAVENOUS

## 2013-09-24 MED ORDER — SODIUM CHLORIDE 0.9 % IV SOLN
1000.0000 mL | Freq: Once | INTRAVENOUS | Status: AC
  Administered 2013-09-24: 1000 mL via INTRAVENOUS

## 2013-09-24 MED ORDER — ONDANSETRON 4 MG PO TBDP
ORAL_TABLET | ORAL | Status: AC
  Filled 2013-09-24: qty 2

## 2013-09-24 NOTE — ED Notes (Signed)
[facility] not available.  [facility] called for non-emergency traffic.

## 2013-09-24 NOTE — Discharge Instructions (Signed)
We have determined that your problem requires further evaluation in the emergency department.  We will take care of your transport there.  Once at the emergency department, you will be evaluated by a provider and they will order whatever treatment or tests they deem necessary.  We cannot guarantee that they will do any specific test or do any specific treatment.  ° °

## 2013-09-24 NOTE — ED Notes (Signed)
Report given to [facility]. 

## 2013-09-24 NOTE — ED Notes (Addendum)
Pt in via EMS from urgent care, went there due to weakness, n/v, found to have elevated glucose levels- sent here for further evaluation, no history of diabetes, given 8mg  zofran ODT at urgent care, IV in LAC-20g, pt alert and oriented- glucose level 897 at urgent care

## 2013-09-24 NOTE — ED Notes (Addendum)
Floor called and updated on pt's blood work, critical lab values received after pt was off the floor.

## 2013-09-24 NOTE — ED Notes (Signed)
MD at bedside. 

## 2013-09-24 NOTE — ED Provider Notes (Signed)
Chief Complaint   Chief Complaint  Patient presents with  . Emesis    History of Present Illness   John Fry is a 73 year old male who's had a 10-14 day history of anorexia, that taste in his mouth, 20, weight loss, nausea, vomiting, and constipation. His voice has been raspy he's had a cough. He's also had excessive thirst. He had a prostate biopsy a month and half ago and has been on Flomax since then. He has a history of a high blood pressure and elevated cholesterol. He is on lisinopril/hydrochlorothiazide and simvastatin. He's had no known previous history of diabetes. He has had no fever, chills, chest pain, shortness of breath, abdominal pain, or urinary symptoms.  Review of Systems     Other than as noted above, the patient denies any of the following symptoms: Systemic:  No fever, chills, sweats, fatigue, myalgias, headache, or anorexia. Eye:  No redness, pain or drainage. ENT:  No earache, nasal congestion, rhinorrhea, sinus pressure, or sore throat. Lungs:  No cough, sputum production, wheezing, shortness of breath.  Cardiovascular:  No chest pain, palpitations, or syncope. GI:  No nausea, vomiting, abdominal pain or diarrhea. GU:  No dysuria, frequency, or hematuria. Skin:  No rash or pruritis.   [place]     Past medical history, family history, social history, meds, and allergies were reviewed.    Physical Examination    Vital signs:  BP 140/98  Pulse 83  Temp(Src) 97.5 F (36.4 C) (Oral)  Resp 20  SpO2 95% General:  Alert, in no distress. Eye:  PERRL, full EOMs.  Lids and conjunctivas were normal. ENT:  TMs and canals were normal, without erythema or inflammation.  Nasal mucosa was clear and uncongested, without drainage.  Mucous membranes were moist.  Pharynx was clear, without exudate or drainage.  There were no oral ulcerations or lesions. Neck:  Supple, no adenopathy, tenderness or mass. Thyroid was normal. Lungs:  No respiratory distress.  Lungs  were clear to auscultation, without wheezes, rales or rhonchi.  Breath sounds were clear and equal bilaterally. Heart:  Regular rhythm, without gallops, murmers or rubs. Abdomen:  Soft, flat, and non-tender to palpation.  No hepatosplenomagaly or mass. Skin:  Clear, warm, and dry, without rash or lesions.  Labs    Results for orders placed during the hospital encounter of 09/24/13  CBC WITH DIFFERENTIAL      Result Value Ref Range   WBC 10.0  4.0 - 10.5 K/uL   RBC 5.25  4.22 - 5.81 MIL/uL   Hemoglobin 16.9  13.0 - 17.0 g/dL   HCT 47.1  39.0 - 52.0 %   MCV 89.7  78.0 - 100.0 fL   MCH 32.2  26.0 - 34.0 pg   MCHC 35.9  30.0 - 36.0 g/dL   RDW 12.5  11.5 - 15.5 %   Platelets 252  150 - 400 K/uL   Neutrophils Relative % 79 (*) 43 - 77 %   Neutro Abs 8.0 (*) 1.7 - 7.7 K/uL   Lymphocytes Relative 16  12 - 46 %   Lymphs Abs 1.6  0.7 - 4.0 K/uL   Monocytes Relative 4  3 - 12 %   Monocytes Absolute 0.4  0.1 - 1.0 K/uL   Eosinophils Relative 1  0 - 5 %   Eosinophils Absolute 0.1  0.0 - 0.7 K/uL   Basophils Relative 0  0 - 1 %   Basophils Absolute 0.0  0.0 - 0.1 K/uL  COMPREHENSIVE  METABOLIC PANEL      Result Value Ref Range   Sodium 126 (*) 137 - 147 mEq/L   Potassium 5.6 (*) 3.7 - 5.3 mEq/L   Chloride 85 (*) 96 - 112 mEq/L   CO2 22  19 - 32 mEq/L   Glucose, Bld 897 (*) 70 - 99 mg/dL   BUN 50 (*) 6 - 23 mg/dL   Creatinine, Ser 2.42 (*) 0.50 - 1.35 mg/dL   Calcium 10.7 (*) 8.4 - 10.5 mg/dL   Total Protein 8.2  6.0 - 8.3 g/dL   Albumin 4.6  3.5 - 5.2 g/dL   AST 16  0 - 37 U/L   ALT 25  0 - 53 U/L   Alkaline Phosphatase 93  39 - 117 U/L   Total Bilirubin 0.5  0.3 - 1.2 mg/dL   GFR calc non Af Amer 25 (*) >90 mL/min   GFR calc Af Amer 29 (*) >90 mL/min  POCT URINALYSIS DIP (DEVICE)      Result Value Ref Range   Glucose, UA 500 (*) NEGATIVE mg/dL   Bilirubin Urine NEGATIVE  NEGATIVE   Ketones, ur TRACE (*) NEGATIVE mg/dL   Specific Gravity, Urine <=1.005  1.005 - 1.030   Hgb urine  dipstick TRACE (*) NEGATIVE   pH 5.0  5.0 - 8.0   Protein, ur NEGATIVE  NEGATIVE mg/dL   Urobilinogen, UA 0.2  0.0 - 1.0 mg/dL   Nitrite NEGATIVE  NEGATIVE   Leukocytes, UA NEGATIVE  NEGATIVE  POCT H PYLORI SCREEN      Result Value Ref Range   H. PYLORI SCREEN, POC POSITIVE (*) NEGATIVE  OCCULT BLOOD, POC DEVICE      Result Value Ref Range   Fecal Occult Bld NEGATIVE  NEGATIVE     Radiology    Dg Abd Acute W/chest  09/24/2013   CLINICAL DATA:  Cough, nausea and vomiting, weight loss, and fatigue.  EXAM: ACUTE ABDOMEN SERIES (ABDOMEN 2 VIEW & CHEST 1 VIEW)  COMPARISON:  Chest x-ray dated 08/16/2012 and MRI of the abdomen dated 06/18/2013  FINDINGS: Heart and lungs appear normal. No free air in the abdomen. Slight thoracolumbar scoliosis. No acute osseous abnormality.  Bowel gas pattern is normal.  IMPRESSION: Benign appearing abdomen and chest.   Electronically Signed   By: Rozetta Nunnery M.D.   On: 09/24/2013 19:07   EKG Results    Date: 09/24/2013  Rate: 79  Rhythm: normal sinus rhythm  QRS Axis: normal  Intervals: normal  ST/T Wave abnormalities: normal  Conduction Disutrbances:none  Narrative Interpretation: Normal sinus rhythm, normal EKG.  Old EKG Reviewed: none available  Course in Urgent [place]    She was begun on IV normal saline at 100 mL per hour and will transfer to the emergency department via [facility].  Assessment   The primary encounter diagnosis was Nausea and vomiting. Diagnoses of Dehydration, Kidney failure, Hyponatremia, and Diabetes mellitus with nonketotic hyperosmolarity were also pertinent to this visit.   Plan     The patient was transferred to the ED via [facility] in stable condition.  Medical Decision Making:  73 year old male with 10 day history of nausea and vomiting.  No abdominal pain or fever.  Has been constipated.  On lab workup:  Glucose 897, Na 126, K 5.6, Cr 2.42,  BUN 50.  EKG, Acute abd series, UA, CBC were WNL.  H. Pylori was  positive.  My impression is new onset diabetes with dehydration.  Will start  IV NS and send by [facility].       Harden Mo, MD 09/24/13 1946

## 2013-09-24 NOTE — H&P (Addendum)
[facility] History and Physical  Patient: John Fry  [id number]  DOB: 12-16-1940  DOS: the patient was seen and examined on 09/24/2013 PCP: Renato Shin, MD  Chief Complaint: Nausea and vomiting with polyuria  HPI: John Fry is a 73 y.o. male with Past medical history of hypertension, prostate cancer scheduled for radiation, dyslipidemia. The patient presents with complaints of nausea and vomiting ongoing since last 2 weeks progressively worsening. He also has complaint of acid reflux or less body ache and malaise. He describes his vomitus is nonbilious nonbloody once a day. He has poor appetite and has lost almost 20 pounds over last 2 weeks. He complains of generalized dizziness and lightheadedness when he walks around. He has on and off constipation but denies any diarrhea. Last bowel movement 2 days ago and has been passing gas. Recent change in medication was admission of Flomax. He was recently diagnosed with prostate cancer and was scheduled for radiation. He denies any abuse of alcohol or drugs. He denies any over-the-counter medications or herbal supplements. He denies any prior history of diabetes or prediabetes.  The patient is coming from home. And at her baseline Independent for most of his  ADL.  Review of Systems: as mentioned in the history of present illness.  A Comprehensive review of the other systems is negative.  Past Medical History  Diagnosis Date  . RENAL INSUFFICIENCY 05/22/2008  . SHOULDER PAIN, RIGHT 05/08/2008  . VENTRICULAR HYPERTROPHY, LEFT 05/08/2008  . DYSLIPIDEMIA 03/05/2007  . HERNIA 03/05/2007  . HYPERTENSION 03/05/2007  . 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 Foot surgery  1999    Right Foot Bunion Repair  . Lower arterial  05/06/2003  . Prostate biopsy  07/23/13    gleason 7, volume 53 ml  . Remove fatty tumor Left 1995    thigh   Social History:   reports that he quit smoking about 7 years ago. He does not have any smokeless tobacco history on file. He reports that he does not drink alcohol or use illicit drugs.  No Known Allergies  Family History  Problem Relation Age of Onset  . Hypertension Neg Hx   . Cancer Father 32    prostate  . Kidney Stones Brother   . Diabetes Sister     Prior to Admission medications   Medication Sig Start Date End Date Taking? Authorizing Provider  lisinopril-hydrochlorothiazide (PRINZIDE,ZESTORETIC) 10-12.5 MG per tablet Take 1 tablet by mouth daily. 04/03/13 04/03/14 Yes Renato Shin, MD  naproxen sodium (ALEVE) 220 MG tablet Take 440 mg by mouth at bedtime as needed (sleep).   Yes Historical Provider, MD  simvastatin (ZOCOR) 80 MG tablet Take 80 mg by mouth at bedtime.   Yes Historical Provider, MD  tamsulosin (FLOMAX) 0.4 MG CAPS capsule Take 0.4 mg by mouth daily. 11am daily   Yes Historical Provider, MD  Tetrahydroz-Glyc-Hyprom-PEG (VISINE MAXIMUM REDNESS RELIEF OP) Place 1 drop into both eyes daily.   Yes Historical Provider, MD    Physical Exam: Filed Vitals:   09/24/13 2101 09/24/13 2130 09/24/13 2145 09/24/13 2215  BP:  137/78 156/90 147/95  Pulse:  68 66 68  Temp:      TempSrc:      Resp:  17 20 15   Height: 5\' 7"  (1.702 m)     Weight: 94.348 kg (208 lb)     SpO2:  98% 97% 99%  General: Alert, Awake and Oriented to Time, Place and Person. Appear in mild distress Eyes: PERRL ENT: Oral Mucosa clear dry. Neck: No JVD Cardiovascular: S1 and S2 Present, no Murmur, Peripheral Pulses Present Respiratory: Bilateral Air entry equal and Decreased, Clear to Auscultation,  No Crackles, no wheezes Abdomen: Bowel Sound Present, Soft and Non tender Skin: No Rash Extremities: No Pedal edema, no calf tenderness Neurologic: Grossly Unremarkable.  Labs on Admission:  CBC:  Recent Labs Lab 09/24/13 1817  WBC 10.0  NEUTROABS 8.0*  HGB 16.9  HCT 47.1  MCV 89.7  PLT 252    CMP      Component Value Date/Time   NA 126* 09/24/2013 1817   K 5.6* 09/24/2013 1817   CL 85* 09/24/2013 1817   CO2 22 09/24/2013 1817   GLUCOSE 897* 09/24/2013 1817   BUN 50* 09/24/2013 1817   CREATININE 2.42* 09/24/2013 1817   CALCIUM 10.7* 09/24/2013 1817   CALCIUM 10.0 10/28/2011 1124   PROT 8.2 09/24/2013 1817   ALBUMIN 4.6 09/24/2013 1817   AST 16 09/24/2013 1817   ALT 25 09/24/2013 1817   ALKPHOS 93 09/24/2013 1817   BILITOT 0.5 09/24/2013 1817   GFRNONAA 25* 09/24/2013 1817   GFRAA 29* 09/24/2013 1817    No results found for this basename: LIPASE, AMYLASE,  in the last 168 hours No results found for this basename: AMMONIA,  in the last 168 hours  No results found for this basename: CKTOTAL, CKMB, CKMBINDEX, TROPONINI,  in the last 168 hours BNP (last 3 results) No results found for this basename: PROBNP,  in the last 8760 hours  Radiological Exams on Admission: Dg Abd Acute W/chest  09/24/2013   CLINICAL DATA:  Cough, nausea and vomiting, weight loss, and fatigue.  EXAM: ACUTE ABDOMEN SERIES (ABDOMEN 2 VIEW & CHEST 1 VIEW)  COMPARISON:  Chest x-ray dated 08/16/2012 and MRI of the abdomen dated 06/18/2013  FINDINGS: Heart and lungs appear normal. No free air in the abdomen. Slight thoracolumbar scoliosis. No acute osseous abnormality.  Bowel gas pattern is normal.  IMPRESSION: Benign appearing abdomen and chest.   Electronically Signed   By: Rozetta Nunnery M.D.   On: 09/24/2013 19:07    Assessment/Plan Principal Problem:   DKA (diabetic ketoacidoses) Active Problems:   HYPERTENSION   Prostate cancer   1. DKA (diabetic ketoacidoses) The patient presents with complaint of nausea vomiting and polyuria. He is found to have significantly elevated glucose with increased anion gap of 28. With this at present he is hemodynamically stable. EKG does not show any significant abnormality. X-ray of the abdomen does not show any significant quantity as well. He does not have any source of infection. Patient will be  admitted to telemetry. He will be given IV insulin bolus and then started on insulin stabilizer. Patient is also given IV fluid bolus. He'll be kept n.p.o. Continue BMP every 2 hours. Check lipase, triglyceride levels,  2. Hypertension Holding antihypertensive medications at present blood pressure stable  3. Prostate cancer Continue Flomax.  4. positive H. pylori stool antigen Patient complaint of nausea and vomiting followed by acid reflux. He does not have any hematemesis. Since H. pylori test is positive he may require treatment and may need followup with GI. At present holding putting him on any antibiotics, as may needed discussion with GI before that.  DVT Prophylaxis: subcutaneous Heparin Nutrition: N.p.o.  Code Status: Full  Family Communication: Wife was present at bedside, opportunity was given to ask question and  all questions were answered satisfactorily at the time of interview. Disposition: Admitted to inpatient in telemetry unit.  Author: Berle Mull, MD [facility] Pager: [phone] 09/24/2013, 10:32 PM    If 7PM-7AM, please contact night-coverage [email or web address] Password [email or web address]

## 2013-09-24 NOTE — ED Notes (Signed)
Admitting MD at BS.  

## 2013-09-24 NOTE — ED Notes (Signed)
Attempted to give report 

## 2013-09-24 NOTE — ED Notes (Signed)
C/o vomiting onset Thur. 4/2.  Vomits about once a day.  No diarrhea.  No chills or fever.  No appetite.  States he has been urinating a lot.

## 2013-09-24 NOTE — ED Provider Notes (Signed)
CSN: [id number]     Arrival date & time 09/24/13  2026 History   First MD Initiated Contact with Patient 09/24/13 2040     Chief Complaint  Patient presents with  . Hyperglycemia     (Consider location/radiation/quality/duration/timing/severity/associated sxs/prior Treatment) Patient is a 73 y.o. male presenting with hyperglycemia. The history is provided by the patient.  Hyperglycemia Associated symptoms: abdominal pain, increased thirst, nausea, polyuria and vomiting   Associated symptoms: no chest pain and no shortness of breath    patient went to the urgent care for nausea and vomiting. He was found to be new onset diabetes with renal insufficiency and mild hyperkalemia. He has mild abdominal pain. He was previously diabetic. He states for last 2 weeks he is felt that his been urinating frequently. No fevers. No cough. He states he's lost around 10 pounds.  Past Medical History  Diagnosis Date  . RENAL INSUFFICIENCY 05/22/2008  . SHOULDER PAIN, RIGHT 05/08/2008  . VENTRICULAR HYPERTROPHY, LEFT 05/08/2008  . DYSLIPIDEMIA 03/05/2007  . HERNIA 03/05/2007  . HYPERTENSION 03/05/2007  . 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 Foot surgery  1999    Right Foot Bunion Repair  . Lower arterial  05/06/2003  . Prostate biopsy  07/23/13    gleason 7, volume 53 ml  . Remove fatty tumor Left 1995    thigh   Family History  Problem Relation Age of Onset  . Hypertension Neg Hx   . Cancer Father 15    prostate  . Kidney Stones Brother   . Diabetes Sister    History  Substance Use Topics  . Smoking status: Former Smoker -- 0.50 packs/day for 20 years    Quit date: 06/20/2006  . Smokeless tobacco: Not on file  . Alcohol Use: No    Review of Systems  Constitutional: Positive for appetite change and unexpected weight change. Negative for activity change.  Eyes: Negative for pain.  Respiratory: Negative for chest tightness  and shortness of breath.   Cardiovascular: Negative for chest pain and leg swelling.  Gastrointestinal: Positive for nausea, vomiting and abdominal pain. Negative for diarrhea.  Endocrine: Positive for polydipsia, polyphagia and polyuria.  Genitourinary: Negative for flank pain.  Musculoskeletal: Negative for back pain and neck stiffness.  Skin: Negative for rash.  Neurological: Negative for weakness, numbness and headaches.  Psychiatric/Behavioral: Negative for behavioral problems.      Allergies  Review of patient's allergies indicates no known allergies.  Home Medications   Current Outpatient Rx  Name  Route  Sig  Dispense  Refill  . lisinopril-hydrochlorothiazide (PRINZIDE,ZESTORETIC) 10-12.5 MG per tablet   Oral   Take 1 tablet by mouth daily.   30 tablet   11   . naproxen sodium (ALEVE) 220 MG tablet   Oral   Take 440 mg by mouth at bedtime as needed (sleep).         . simvastatin (ZOCOR) 80 MG tablet   Oral   Take 80 mg by mouth at bedtime.         . tamsulosin (FLOMAX) 0.4 MG CAPS capsule   Oral   Take 0.4 mg by mouth daily. 11am daily         . Tetrahydroz-Glyc-Hyprom-PEG (VISINE MAXIMUM REDNESS RELIEF OP)   Both Eyes   Place 1 drop into both eyes daily.          BP 137/78  Pulse 68  Temp(Src) 97.5 F (36.4 C) (Oral)  Resp 17  Ht 5\' 7"  (1.702 m)  Wt 208 lb (94.348 kg)  BMI 32.57 kg/m2  SpO2 98% Physical Exam  Nursing note and vitals reviewed. Constitutional: He is oriented to person, place, and time. He appears well-developed and well-nourished.  HENT:  Head: Normocephalic and atraumatic.  Eyes: EOM are normal. Pupils are equal, round, and reactive to light.  Neck: Normal range of motion. Neck supple.  Cardiovascular: Normal rate, regular rhythm and normal heart sounds.   No murmur heard. Pulmonary/Chest: Effort normal and breath sounds normal.  Abdominal: Soft. Bowel sounds are normal. He exhibits no distension and no mass. There is  tenderness. There is no rebound and no guarding.  Mild upper abdominal tenderness without rebound or guarding.  Musculoskeletal: Normal range of motion. He exhibits no edema.  Neurological: He is alert and oriented to person, place, and time. No cranial nerve deficit.  Skin: Skin is warm and dry.  Psychiatric: He has a normal mood and affect.    ED Course  Procedures (including critical care time) Labs Review Labs Reviewed  CBG MONITORING, ED - Abnormal; Notable for the following:    Glucose-Capillary >600 (*)    All other components within normal limits  HEMOGLOBIN [id number]  BASIC METABOLIC PANEL   Imaging Review Dg Abd Acute W/chest  09/24/2013   CLINICAL DATA:  Cough, nausea and vomiting, weight loss, and fatigue.  EXAM: ACUTE ABDOMEN SERIES (ABDOMEN 2 VIEW & CHEST 1 VIEW)  COMPARISON:  Chest x-ray dated 08/16/2012 and MRI of the abdomen dated 06/18/2013  FINDINGS: Heart and lungs appear normal. No free air in the abdomen. Slight thoracolumbar scoliosis. No acute osseous abnormality.  Bowel gas pattern is normal.  IMPRESSION: Benign appearing abdomen and chest.   Electronically Signed   By: Rozetta Nunnery M.D.   On: 09/24/2013 19:07     EKG Interpretation None      MDM   Final diagnoses:  Hyperglycemia  Renal failure  Hyperkalemia    Patient with new onset diabetes and hyperglycemia. Worsening renal function. Does not appear to be in DKA. Will admit to internal medicine.    Jasper Riling. Alvino Chapel, MD 09/24/13 2152

## 2013-09-24 NOTE — ED Notes (Signed)
Having dry heaves on his way to xray after Zofran given.  Asked for water and was told not yet. Cooled face with washcloth.

## 2013-09-25 DIAGNOSIS — R7309 Other abnormal glucose: Secondary | ICD-10-CM

## 2013-09-25 DIAGNOSIS — I10 Essential (primary) hypertension: Secondary | ICD-10-CM

## 2013-09-25 DIAGNOSIS — A048 Other specified bacterial intestinal infections: Secondary | ICD-10-CM

## 2013-09-25 DIAGNOSIS — E1110 Type 2 diabetes mellitus with ketoacidosis without coma: Secondary | ICD-10-CM

## 2013-09-25 HISTORY — DX: Other specified bacterial intestinal infections: A04.8

## 2013-09-25 LAB — HEPATIC FUNCTION PANEL
ALBUMIN: 4.1 g/dL (ref 3.5–5.2)
ALT: 20 U/L (ref 0–53)
AST: 14 U/L (ref 0–37)
Alkaline Phosphatase: 82 U/L (ref 39–117)
BILIRUBIN TOTAL: 0.4 mg/dL (ref 0.3–1.2)
Total Protein: 7.4 g/dL (ref 6.0–8.3)

## 2013-09-25 LAB — URINALYSIS, ROUTINE W REFLEX MICROSCOPIC
BILIRUBIN URINE: NEGATIVE
Glucose, UA: >1000 mg/dL — AB
KETONES UR: NEGATIVE mg/dL
Leukocytes, UA: NEGATIVE
NITRITE: NEGATIVE
PROTEIN: NEGATIVE mg/dL
Specific Gravity, Urine: 1.032 — ABNORMAL HIGH (ref 1.005–1.030)
UROBILINOGEN UA: 0.2 mg/dL (ref 0.0–1.0)
pH: 5 (ref 5.0–8.0)

## 2013-09-25 LAB — BASIC METABOLIC PANEL
BUN: 46 mg/dL — AB (ref 6–23)
BUN: 42 mg/dL — AB (ref 6–23)
BUN: 38 mg/dL — AB (ref 6–23)
BUN: 37 mg/dL — AB (ref 6–23)
CALCIUM: 9.2 mg/dL (ref 8.4–10.5)
CALCIUM: 9.4 mg/dL (ref 8.4–10.5)
CHLORIDE: 95 meq/L — AB (ref 96–112)
CHLORIDE: 102 meq/L (ref 96–112)
CO2: 19 mEq/L (ref 19–32)
CO2: 21 meq/L (ref 19–32)
CO2: 22 meq/L (ref 19–32)
CO2: 25 mEq/L (ref 19–32)
CREATININE: 2.08 mg/dL — AB (ref 0.50–1.35)
CREATININE: 2 mg/dL — AB (ref 0.50–1.35)
CREATININE: 1.95 mg/dL — AB (ref 0.50–1.35)
Calcium: 9.8 mg/dL (ref 8.4–10.5)
Calcium: 10 mg/dL (ref 8.4–10.5)
Chloride: 107 mEq/L (ref 96–112)
Chloride: 107 mEq/L (ref 96–112)
Creatinine, Ser: 2.09 mg/dL — ABNORMAL HIGH (ref 0.50–1.35)
GFR calc Af Amer: 35 mL/min — ABNORMAL LOW (ref >90)
GFR calc Af Amer: 36 mL/min — ABNORMAL LOW (ref >90)
GFR calc non Af Amer: 30 mL/min — ABNORMAL LOW (ref >90)
GFR calc non Af Amer: 30 mL/min — ABNORMAL LOW (ref >90)
GFR, EST AFRICAN AMERICAN: 34 mL/min — AB (ref >90)
GFR, EST AFRICAN AMERICAN: 38 mL/min — AB (ref >90)
GFR, EST NON AFRICAN AMERICAN: 31 mL/min — AB (ref >90)
GFR, EST NON AFRICAN AMERICAN: 32 mL/min — AB (ref >90)
GLUCOSE: 278 mg/dL — AB (ref 70–99)
GLUCOSE: 205 mg/dL — AB (ref 70–99)
Glucose, Bld: 610 mg/dL (ref 70–99)
Glucose, Bld: 378 mg/dL — ABNORMAL HIGH (ref 70–99)
Potassium: 5.8 mEq/L — ABNORMAL HIGH (ref 3.7–5.3)
Potassium: 4.7 mEq/L (ref 3.7–5.3)
Potassium: 4.9 mEq/L (ref 3.7–5.3)
Potassium: 4.3 mEq/L (ref 3.7–5.3)
Sodium: 132 mEq/L — ABNORMAL LOW (ref 137–147)
Sodium: 138 mEq/L (ref 137–147)
Sodium: 142 mEq/L (ref 137–147)
Sodium: 144 mEq/L (ref 137–147)

## 2013-09-25 LAB — GLUCOSE, CAPILLARY
GLUCOSE-CAPILLARY: 276 mg/dL — AB (ref 70–99)
GLUCOSE-CAPILLARY: 249 mg/dL — AB (ref 70–99)
GLUCOSE-CAPILLARY: 214 mg/dL — AB (ref 70–99)
Glucose-Capillary: 360 mg/dL — ABNORMAL HIGH (ref 70–99)
Glucose-Capillary: 585 mg/dL (ref 70–99)
Glucose-Capillary: 261 mg/dL — ABNORMAL HIGH (ref 70–99)
Glucose-Capillary: 192 mg/dL — ABNORMAL HIGH (ref 70–99)
Glucose-Capillary: 127 mg/dL — ABNORMAL HIGH (ref 70–99)
Glucose-Capillary: 160 mg/dL — ABNORMAL HIGH (ref 70–99)
Glucose-Capillary: 89 mg/dL (ref 70–99)
Glucose-Capillary: 187 mg/dL — ABNORMAL HIGH (ref 70–99)
Glucose-Capillary: 251 mg/dL — ABNORMAL HIGH (ref 70–99)
Glucose-Capillary: 209 mg/dL — ABNORMAL HIGH (ref 70–99)

## 2013-09-25 LAB — HEMOGLOBIN A1C
HEMOGLOBIN A1C: 14.7 % — AB (ref <5.7)
MEAN PLASMA GLUCOSE: 375 mg/dL — AB (ref <117)

## 2013-09-25 LAB — LIPASE, BLOOD: LIPASE: 101 U/L — AB (ref 11–59)

## 2013-09-25 LAB — URINE MICROSCOPIC-ADD ON

## 2013-09-25 MED ORDER — INSULIN GLARGINE 100 UNIT/ML ~~LOC~~ SOLN
25.0000 [IU] | Freq: Every day | SUBCUTANEOUS | Status: DC
  Administered 2013-09-25: 25 [IU] via SUBCUTANEOUS
  Filled 2013-09-25 (×2): qty 0.25

## 2013-09-25 MED ORDER — CLARITHROMYCIN 500 MG PO TABS
500.0000 mg | ORAL_TABLET | Freq: Two times a day (BID) | ORAL | Status: DC
  Administered 2013-09-25 – 2013-09-27 (×4): 500 mg via ORAL
  Filled 2013-09-25 (×5): qty 1

## 2013-09-25 MED ORDER — BD GETTING STARTED TAKE HOME KIT: 1/2ML X 30G SYRINGES
1.0000 | Freq: Once | Status: DC
  Filled 2013-09-25: qty 1

## 2013-09-25 MED ORDER — INSULIN ASPART 100 UNIT/ML ~~LOC~~ SOLN
0.0000 [IU] | Freq: Three times a day (TID) | SUBCUTANEOUS | Status: DC
  Administered 2013-09-25: 8 [IU] via SUBCUTANEOUS
  Administered 2013-09-25: 5 [IU] via SUBCUTANEOUS
  Administered 2013-09-25: 3 [IU] via SUBCUTANEOUS
  Administered 2013-09-26 (×2): 8 [IU] via SUBCUTANEOUS
  Administered 2013-09-26 – 2013-09-27 (×2): 5 [IU] via SUBCUTANEOUS
  Administered 2013-09-27: 12:00:00 via SUBCUTANEOUS

## 2013-09-25 MED ORDER — AMLODIPINE BESYLATE 5 MG PO TABS
5.0000 mg | ORAL_TABLET | Freq: Every day | ORAL | Status: DC
  Administered 2013-09-25 – 2013-09-27 (×3): 5 mg via ORAL
  Filled 2013-09-25 (×3): qty 1

## 2013-09-25 MED ORDER — "BD GETTING STARTED TAKE HOME KIT: 1ML X 30 G SYRINGES, "
1.0000 | Freq: Once | Status: AC
  Administered 2013-09-25: 1
  Filled 2013-09-25 (×2): qty 1

## 2013-09-25 MED ORDER — PANTOPRAZOLE SODIUM 40 MG PO TBEC
40.0000 mg | DELAYED_RELEASE_TABLET | Freq: Every day | ORAL | Status: DC
  Administered 2013-09-25 – 2013-09-27 (×3): 40 mg via ORAL
  Filled 2013-09-25 (×3): qty 1

## 2013-09-25 MED ORDER — INSULIN ASPART 100 UNIT/ML ~~LOC~~ SOLN
0.0000 [IU] | Freq: Every day | SUBCUTANEOUS | Status: DC
  Administered 2013-09-25: 2 [IU] via SUBCUTANEOUS

## 2013-09-25 MED ORDER — AMOXICILLIN 500 MG PO CAPS
500.0000 mg | ORAL_CAPSULE | Freq: Three times a day (TID) | ORAL | Status: DC
  Administered 2013-09-25 – 2013-09-27 (×7): 500 mg via ORAL
  Filled 2013-09-25 (×9): qty 1

## 2013-09-25 NOTE — [place] Notes (Signed)
Patient admitted in DKA with new onset Diabetes.  Patient's A1c 14.7%.  Spoke with patient and wife at length about new diagnosis.  Patient stated that he had already watched one video on the patient education channel.  Explained the need for insulin and s/s of hypo and hyperglycemia.  Also encouraged patient to practice insulin administration with scheduled doses as well as checking CBGs.  Also encouraged patient to attend the outpatient DM education program once he is discharged.  Ordered additional inpatient and outpatient education and RD consult.  Will continue to follow.    Courtney Heys PhD, RN, BC-ADM Diabetes Coordinator  Office:  [phone] Team Pager:  [phone]

## 2013-09-25 NOTE — Care Management Note (Unsigned)
    Page 1 of 1   09/25/2013     2:58:17 PM   CARE MANAGEMENT NOTE 09/25/2013  Patient:  John Fry, John Fry   Account Number:  [id number]  Date Initiated:  09/25/2013  Documentation initiated by:  Tashawna Thom  Subjective/Objective Assessment:   PT ADM ON 4/7 WITH DKA.  PTA, PT INDEPENDENT, LIVES WITH SPOUSE.     Action/Plan:   WILL FOLLOW FOR DC NEEDS AS PT PROGRESSES.   Anticipated DC Date:  09/27/2013   Anticipated DC Plan:  [place]  CM consult      Choice offered to / List presented to:             Status of service:  In process, will continue to follow Medicare Important Message given?   (If response is "NO", the following Medicare IM given date fields will be blank) Date Medicare IM given:   Date Additional Medicare IM given:    Discharge Disposition:    Per UR Regulation:  Reviewed for med. necessity/level of care/duration of stay  If discussed at [place] of Stay Meetings, dates discussed:    Comments:  [facility]  [phone]

## 2013-09-25 NOTE — [place] Notes (Signed)
Utilization Review Completed.Neoma Laming T Dowell4/01/2014

## 2013-09-25 NOTE — [place] Notes (Addendum)
[facility] PROGRESS NOTE  John Fry [id number] DOB: Oct 22, 1940 DOA: 09/24/2013 PCP: Renato Shin, MD  Assessment/Plan: 1. Diabetes mellitus, new diagnosis -Patient presented with blood sugars in the 700 range -Unaware of having diabetes and had not been on hypoglycemics prior to this hospitalization -Placed on IV insulin overnight, change to Lantus 25 units subcutaneous -Diabetic educator consulted -Diet advanced -Perform Accu-Cheks q. a.c. each bedtime with sliding scale coverage  2.  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  Hypertension -Start Norvasc 5 mg by mouth daily  4. H. Pylori -Initiate Amoxicillin, Clarithromycin, Protonix -Will need outpatient GI followup   Code Status: Full code Family Communication: Spoke with patient's wife present at bedside Disposition Plan:    Consultants:  Diabetic coordinator    HPI/Subjective: Patient is a pleasant 73 year old gentleman admitted to the medicine service on 09/24/2013 presenting with complaints of nausea vomiting and polyuria. He was found to have elevated blood sugars in the 700 range. He denied having a history of diabetes mellitus. Or brachial started on IV insulin, which was discontinued this morning and changed to Lantus 25 units subcutaneous daily patient this morning states feeling better.  Objective: Filed Vitals:   09/25/13 0445  BP: 131/76  Pulse: 61  Temp: 98.1 F (36.7 C)  Resp: 18    Intake/Output Summary (Last 24 hours) at 09/25/13 1332 Last data filed at 09/25/13 0700  Gross per 24 hour  Intake      0 ml  Output   1050 ml  Net  -1050 ml   Filed Weights   09/24/13 2032 09/24/13 2101 09/24/13 2258  Weight: 94.348 kg (208 lb) 94.348 kg (208 lb) 87.7 kg (193 lb 5.5 oz)    Exam:   General:  No acute distress, awake alert oriented  Cardiovascular: Regular rate rhythm  Respiratory:  Normal respiratory effort  Abdomen: Soft nontender nondistended  Musculoskeletal: No edema  Data Reviewed: Basic Metabolic Panel:  Recent Labs Lab 09/24/13 2126 09/24/13 2304 09/25/13 0114 09/25/13 0335 09/25/13 0520  NA 127* 132* 138 142 144  K 7.0* 5.8* 4.7 4.9 4.3  CL 89* 95* 102 107 107  CO2 19 19 21 22 25   GLUCOSE 746* 610* 378* 278* 205*  BUN 48* 46* 42* 38* 37*  CREATININE 2.11* 2.09* 2.08* 2.00* 1.95*  CALCIUM 9.6 9.8 10.0 9.2 9.4   Liver Function Tests:  Recent Labs Lab 09/24/13 1817 09/24/13 2304  AST 16 14  ALT 25 20  ALKPHOS 93 82  BILITOT 0.5 0.4  PROT 8.2 7.4  ALBUMIN 4.6 4.1    Recent Labs Lab 09/24/13 2304  LIPASE 101*   No results found for this basename: AMMONIA,  in the last 168 hours CBC:  Recent Labs Lab 09/24/13 1817  WBC 10.0  NEUTROABS 8.0*  HGB 16.9  HCT 47.1  MCV 89.7  PLT 252   Cardiac Enzymes: No results found for this basename: CKTOTAL, CKMB, CKMBINDEX, TROPONINI,  in the last 168 hours BNP (last 3 results) No results found for this basename: PROBNP,  in the last 8760 hours CBG:  Recent Labs Lab 09/25/13 0653 09/25/13 0801 09/25/13 0902 09/25/13 1002 09/25/13 1125  GLUCAP 160* 127* 89 187* 251*    No results found for this or any previous visit (from the past 240 hour(s)).   Studies: Dg Abd Acute W/chest  09/24/2013   CLINICAL DATA:  Cough, nausea and vomiting, weight loss, and fatigue.  EXAM: ACUTE ABDOMEN SERIES (ABDOMEN 2 VIEW & CHEST 1 VIEW)  COMPARISON:  Chest x-ray dated 08/16/2012 and MRI of the abdomen dated 06/18/2013  FINDINGS: Heart and lungs appear normal. No free air in the abdomen. Slight thoracolumbar scoliosis. No acute osseous abnormality.  Bowel gas pattern is normal.  IMPRESSION: Benign appearing abdomen and chest.   Electronically Signed   By: Rozetta Nunnery M.D.   On: 09/24/2013 19:07    Scheduled Meds: . heparin  5,000 Units Subcutaneous 3 times per day  . insulin aspart  0-15 Units  Subcutaneous TID WC  . insulin aspart  0-5 Units Subcutaneous QHS  . insulin glargine  25 Units Subcutaneous Daily  . tamsulosin  0.4 mg Oral Daily   Continuous Infusions: . sodium chloride 1,000 mL (09/24/13 2057)  . sodium chloride 125 mL/hr at 09/24/13 2345  . dextrose 5 % and 0.45% NaCl    . dextrose 5 % and 0.45% NaCl 100 mL/hr at 09/25/13 0500  . insulin (NOVOLIN-R) infusion 4 Units/hr (09/25/13 0655)  . insulin (NOVOLIN-R) infusion 8 Units/hr (09/24/13 2245)    Principal Problem:   DKA (diabetic ketoacidoses) Active Problems:   HYPERTENSION   Prostate cancer   Helicobacter pylori (H. pylori)    Time spent: 35 min    [facility] Pager [phone] 7PM-7AM, please contact night-coverage at [email or web address], password [email or web address] 09/25/2013, 1:32 PM  LOS: 1 day

## 2013-09-25 NOTE — [place] Notes (Signed)
Nutrition Brief Note  RD consulted for new onset diabetes.  Patient sleeping soundly upon RD visit; unable to wake.  Left handouts "Carbohydrate Counting for People with Diabetes" and "Diabetes Label Reading Tips" from [employer].  RD to return at later date.  Arthur Holms, RD, LDN Pager #: [phone] After-Hours Pager #: [phone]

## 2013-09-25 NOTE — [place] Notes (Signed)
Bladder scanned patient due to renal retention. 495 cc. Patient was educated by RN and MD at bedside. Patient voided 300 cc of urine. Bladder scan showed 169 cc following. Will continue to monitor. Call bell within reach.    Domingo Dimes RN

## 2013-09-25 NOTE — [place] Notes (Signed)
Patient CBG >600. MD notified. Ordered 10 units novolog injection and rate change increase of 8 units/hr. Will Continue to monitor. Call bell within reach.  Domingo Dimes RN

## 2013-09-26 DIAGNOSIS — N183 Chronic kidney disease, stage 3 unspecified: Secondary | ICD-10-CM

## 2013-09-26 LAB — GLUCOSE, CAPILLARY
GLUCOSE-CAPILLARY: 218 mg/dL — AB (ref 70–99)
GLUCOSE-CAPILLARY: 298 mg/dL — AB (ref 70–99)
GLUCOSE-CAPILLARY: 251 mg/dL — AB (ref 70–99)
Glucose-Capillary: 184 mg/dL — ABNORMAL HIGH (ref 70–99)

## 2013-09-26 LAB — URINE CULTURE: Colony Count: 9000

## 2013-09-26 MED ORDER — INSULIN GLARGINE 100 UNIT/ML ~~LOC~~ SOLN
30.0000 [IU] | Freq: Every day | SUBCUTANEOUS | Status: DC
  Administered 2013-09-26: 30 [IU] via SUBCUTANEOUS
  Filled 2013-09-26: qty 0.3

## 2013-09-26 MED ORDER — INSULIN GLARGINE 100 UNIT/ML ~~LOC~~ SOLN
10.0000 [IU] | Freq: Once | SUBCUTANEOUS | Status: AC
  Administered 2013-09-26: 10 [IU] via SUBCUTANEOUS
  Filled 2013-09-26: qty 0.1

## 2013-09-26 MED ORDER — INSULIN GLARGINE 100 UNIT/ML ~~LOC~~ SOLN
40.0000 [IU] | Freq: Every day | SUBCUTANEOUS | Status: DC
  Administered 2013-09-27: 40 [IU] via SUBCUTANEOUS
  Filled 2013-09-26 (×2): qty 0.4

## 2013-09-26 NOTE — Progress Notes (Signed)
[facility] PROGRESS NOTE  John Fry [id number] DOB: [birth year] DOA: 09/24/2013 PCP: Renato Shin, MD  Assessment/Plan: 1. Diabetes mellitus, new diagnosis -Patient presented with blood sugars in the 700 range -Unaware of having diabetes and had not been on hypoglycemics prior to this hospitalization -Blood sugars remain in the 200's range (last accucheck 298) -Increasing Lantus to 40 units [place] daily -Diabetic educator consulted -Diet advanced -Perform Accu-Cheks q. a.c. each bedtime with sliding scale coverage  2.  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  Hypertension -Started Norvasc 5 mg by mouth daily -Blood pressures better controlled  4. H. Pylori -Initiate Amoxicillin, Clarithromycin, Protonix -Will need outpatient GI followup   Code Status: Full code Family Communication: Spoke with patient's wife present at bedside Disposition Plan: Blood sugars remain elevated, increasing Insulin, anticipate discharge in 24 hours   Consultants:  Diabetic coordinator    HPI/Subjective: Patient is a pleasant 73 year old gentleman admitted to the medicine service on 09/24/2013 presenting with complaints of nausea vomiting and polyuria. He was found to have elevated blood sugars in the 700 range. He denied having a history of diabetes mellitus. On admission started on IV insulin, now on lantus. BS remain elevated.   Objective: Filed Vitals:   09/26/13 1350  BP: 123/75  Pulse: 73  Temp: 98.3 F (36.8 C)  Resp: 20    Intake/Output Summary (Last 24 hours) at 09/26/13 1416 Last data filed at 09/26/13 1300  Gross per 24 hour  Intake    720 ml  Output   1476 ml  Net   -756 ml   Filed Weights   09/24/13 2032 09/24/13 2101 09/24/13 2258  Weight: 94.348 kg (208 lb) 94.348 kg (208 lb) 87.7 kg (193 lb 5.5 oz)    Exam:   General:  No acute distress, awake alert  oriented  Cardiovascular: Regular rate rhythm  Respiratory: Normal respiratory effort  Abdomen: Soft nontender nondistended  Musculoskeletal: No edema  Data Reviewed: Basic Metabolic Panel:  Recent Labs Lab 09/24/13 2126 09/24/13 2304 09/25/13 0114 09/25/13 0335 09/25/13 0520  NA 127* 132* 138 142 144  K 7.0* 5.8* 4.7 4.9 4.3  CL 89* 95* 102 107 107  CO2 19 19 21 22 25   GLUCOSE 746* 610* 378* 278* 205*  BUN 48* 46* 42* 38* 37*  CREATININE 2.11* 2.09* 2.08* 2.00* 1.95*  CALCIUM 9.6 9.8 10.0 9.2 9.4   Liver Function Tests:  Recent Labs Lab 09/24/13 1817 09/24/13 2304  AST 16 14  ALT 25 20  ALKPHOS 93 82  BILITOT 0.5 0.4  PROT 8.2 7.4  ALBUMIN 4.6 4.1    Recent Labs Lab 09/24/13 2304  LIPASE 101*   No results found for this basename: AMMONIA,  in the last 168 hours CBC:  Recent Labs Lab 09/24/13 1817  WBC 10.0  NEUTROABS 8.0*  HGB 16.9  HCT 47.1  MCV 89.7  PLT 252   Cardiac Enzymes: No results found for this basename: CKTOTAL, CKMB, CKMBINDEX, TROPONINI,  in the last 168 hours BNP (last 3 results) No results found for this basename: PROBNP,  in the last 8760 hours CBG:  Recent Labs Lab 09/25/13 1125 09/25/13 1633 09/25/13 2102 09/26/13 0600 09/26/13 1106  GLUCAP 251* 209* 214* 218* 298*    Recent Results (from the past 240 hour(s))  URINE CULTURE     Status: None   Collection Time    09/25/13  3:43 AM  Result Value Ref Range Status   Specimen Description URINE, CLEAN CATCH   Final   Special Requests NONE   Final   Culture  Setup Time     Final   Value: 09/25/2013 07:28     Performed at [place]     Final   Value: 9,000 COLONIES/ML     Performed at [facility]   Culture     Final   Value: INSIGNIFICANT GROWTH     Performed at [facility]   Report Status 09/26/2013 FINAL   Final     Studies: Dg Abd Acute W/chest  09/24/2013   CLINICAL DATA:  Cough, nausea and vomiting, weight  loss, and fatigue.  EXAM: ACUTE ABDOMEN SERIES (ABDOMEN 2 VIEW & CHEST 1 VIEW)  COMPARISON:  Chest x-ray dated 08/16/2012 and MRI of the abdomen dated 06/18/2013  FINDINGS: Heart and lungs appear normal. No free air in the abdomen. Slight thoracolumbar scoliosis. No acute osseous abnormality.  Bowel gas pattern is normal.  IMPRESSION: Benign appearing abdomen and chest.   Electronically Signed   By: Rozetta Nunnery M.D.   On: 09/24/2013 19:07    Scheduled Meds: . amLODipine  5 mg Oral Daily  . amoxicillin  500 mg Oral 3 times per day  . clarithromycin  500 mg Oral Q12H  . heparin  5,000 Units Subcutaneous 3 times per day  . insulin aspart  0-15 Units Subcutaneous TID WC  . insulin aspart  0-5 Units Subcutaneous QHS  . insulin glargine  10 Units Subcutaneous Once  . [START ON 09/27/2013] insulin glargine  40 Units Subcutaneous Daily  . pantoprazole  40 mg Oral Daily  . tamsulosin  0.4 mg Oral Daily   Continuous Infusions:    Principal Problem:   DKA (diabetic ketoacidoses) Active Problems:   HYPERTENSION   Prostate cancer   Helicobacter pylori (H. pylori)    Time spent: 35 min    [facility] Pager [phone] 7PM-7AM, please contact night-coverage at [email or web address], password [facility] 09/26/2013, 2:16 PM  LOS: 2 days

## 2013-09-26 NOTE — Progress Notes (Signed)
Pt gave self injection of insulins. Pt performed well. Teaching will continue.

## 2013-09-27 ENCOUNTER — Encounter (HOSPITAL_COMMUNITY): Payer: Self-pay | Admitting: *Deleted

## 2013-09-27 LAB — BASIC METABOLIC PANEL
BUN: 21 mg/dL (ref 6–23)
CHLORIDE: 101 meq/L (ref 96–112)
CO2: 21 meq/L (ref 19–32)
CREATININE: 1.64 mg/dL — AB (ref 0.50–1.35)
Calcium: 8.7 mg/dL (ref 8.4–10.5)
GFR calc Af Amer: 46 mL/min — ABNORMAL LOW (ref >90)
GFR calc non Af Amer: 40 mL/min — ABNORMAL LOW (ref >90)
GLUCOSE: 310 mg/dL — AB (ref 70–99)
Potassium: 3.8 mEq/L (ref 3.7–5.3)
Sodium: 135 mEq/L — ABNORMAL LOW (ref 137–147)

## 2013-09-27 LAB — GLUCOSE, CAPILLARY
GLUCOSE-CAPILLARY: 226 mg/dL — AB (ref 70–99)
GLUCOSE-CAPILLARY: 310 mg/dL — AB (ref 70–99)
Glucose-Capillary: 274 mg/dL — ABNORMAL HIGH (ref 70–99)

## 2013-09-27 MED ORDER — BD GETTING STARTED TAKE HOME KIT: 3/10ML X 30G SYRINGES
1.0000 | Freq: Once | Status: AC
  Administered 2013-09-27: 1
  Filled 2013-09-27: qty 1

## 2013-09-27 MED ORDER — AMOXICILL-CLARITHRO-LANSOPRAZ PO MISC: Freq: Two times a day (BID) | ORAL | Status: DC

## 2013-09-27 MED ORDER — AMLODIPINE BESYLATE 5 MG PO TABS: 5.0000 mg | ORAL_TABLET | Freq: Every day | ORAL | Status: DC

## 2013-09-27 MED ORDER — "BD GETTING STARTED TAKE HOME KIT: 1ML X 30 G SYRINGES, ": 1.0000 | Freq: Once | Status: DC

## 2013-09-27 MED ORDER — INSULIN GLARGINE 100 UNIT/ML ~~LOC~~ SOLN: 45.0000 [IU] | Freq: Every day | SUBCUTANEOUS | Status: DC

## 2013-09-27 MED ORDER — INSULIN ASPART 100 UNIT/ML ~~LOC~~ SOLN
8.0000 [IU] | Freq: Three times a day (TID) | SUBCUTANEOUS | Status: DC
Start: 2013-09-27 — End: 2013-10-07

## 2013-09-27 MED ORDER — BD GETTING STARTED TAKE HOME KIT: 3/10ML X 30G SYRINGES: 1.0000 | Freq: Once | Status: DC

## 2013-09-27 NOTE — Discharge Summary (Addendum)
Physician Discharge Summary  John Fry [id number] DOB: 02-27-1941 DOA: 09/24/2013  PCP: Renato Shin, MD  Admit date: 09/24/2013 Discharge date: 09/27/2013  Time spent: 35 min minutes  Recommendations for Outpatient Follow-up:  1. Please followup on blood sugars, newly diagnosed diabetes mellitus. Was discharged on 45 units of Lantus with 8 units of NovoLog to be administered with meals. Patient instructed to check blood sugars prior to each meal record them on a log book and present them on his hospital followup appointment 2. Patient found to be H. pylori positive, discharged on a Prevpac  Discharge Diagnoses:  Principal Problem:   DKA (diabetic ketoacidoses) Active Problems:   HYPERTENSION   Prostate cancer   Helicobacter pylori (H. pylori)   Discharge Condition: Stable/improved  Diet recommendation: Heart healthy, consistent diet  Filed Weights   09/24/13 2032 09/24/13 2101 09/24/13 2258  Weight: 94.348 kg (208 lb) 94.348 kg (208 lb) 87.7 kg (193 lb 5.5 oz)    History of present illness:  John Fry is a 73 y.o. male with Past medical history of hypertension, prostate cancer scheduled for radiation, dyslipidemia.  The patient presents with complaints of nausea and vomiting ongoing since last 2 weeks progressively worsening. He also has complaint of acid reflux or less body ache and malaise. He describes his vomitus is nonbilious nonbloody once a day. He has poor appetite and has lost almost 20 pounds over last 2 weeks. He complains of generalized dizziness and lightheadedness when he walks around. He has on and off constipation but denies any diarrhea. Last bowel movement 2 days ago and has been passing gas.  Recent change in medication was admission of Flomax.  He was recently diagnosed with prostate cancer and was scheduled for radiation.  He denies any abuse of alcohol or drugs. He denies any over-the-counter medications or herbal supplements. He denies  any prior history of diabetes or prediabetes.  Hospital Course:  Patient is a pleasant 72 year old with a past medical history of hypertension, admitted to the medicine service on 09/24/2013 presented initially with complaints of nausea vomiting, polyuria polydipsia. He was found to have elevated blood sugars in the 700 range. Patient denied having a history of diabetes mellitus. He was started on IV insulin. Blood sugars normalizing by the following morning as he was transitioned initially to Lantus 25 units subcutaneous daily. Meanwhile patient receiving diabetic education as a diabetic coronary was consulted. Furthermore nursing staff offering him diabetic teaching on insulin administration. Patient's blood sugars remain elevated in the 200-300 range for which his Lantus was gradually titrated up. He'll be discharged on 45 units of Lantus with 8 units of NovoLog for meal time coverage. Prior to discharge he was set up with outpatient diabetic teaching. Other issues addressed during this hospitalization include H. Pylori positive. He was started on antimicrobial therapy and discharged on a Prevpac.  Patient instructed to followup with his primary care provider in one week. He was discharged in stable condition on 09/27/2013.   Consultations:  Diabetic coordinator  Case manager  Discharge Exam: Filed Vitals:   09/27/13 0519  BP: 122/64  Pulse: 63  Temp: 97.9 F (36.6 C)  Resp: 16   General: No acute distress, awake alert oriented  Cardiovascular: Regular rate rhythm  Respiratory: Normal respiratory effort  Abdomen: Soft nontender nondistended  Musculoskeletal: No edema   Discharge Instructions You were cared for by a hospitalist during your hospital stay. If you have any questions about your discharge medications or the care you  received while you were in the hospital after you are discharged, you can call the unit and asked to speak with the hospitalist on call if the hospitalist that  took care of you is not available. Once you are discharged, your primary care physician will handle any further medical issues. Please note that NO REFILLS for any discharge medications will be authorized once you are discharged, as it is imperative that you return to your primary care physician (or establish a relationship with a primary care physician if you do not have one) for your aftercare needs so that they can reassess your need for medications and monitor your lab values.  Discharge Orders   Future Orders Complete By Expires   Ambulatory referral to Nutrition and Diabetic Education  As directed    Call MD for:  extreme fatigue  As directed    Call MD for:  persistant dizziness or light-headedness  As directed    Call MD for:  persistant nausea and vomiting  As directed    Call MD for:  severe uncontrolled pain  As directed    Call MD for:  temperature >100.4  As directed    Diet - low sodium heart healthy  As directed    Discharge instructions  As directed    Increase activity slowly  As directed        Medication List    STOP taking these medications       ALEVE 220 MG tablet  Generic drug:  naproxen sodium     lisinopril-hydrochlorothiazide 10-12.5 MG per tablet  Commonly known as:  PRINZIDE,ZESTORETIC     VISINE MAXIMUM REDNESS RELIEF OP      TAKE these medications       amLODipine 5 MG tablet  Commonly known as:  NORVASC  Take 1 tablet (5 mg total) by mouth daily.     insulin aspart 100 UNIT/ML injection  Commonly known as:  NOVOLOG  Inject 8 Units into the skin 3 (three) times daily before meals.     insulin glargine 100 UNIT/ML injection  Commonly known as:  LANTUS  Inject 0.45 mLs (45 Units total) into the skin daily.     simvastatin 80 MG tablet  Commonly known as:  ZOCOR  Take 80 mg by mouth at bedtime.     tamsulosin 0.4 MG Caps capsule  Commonly known as:  FLOMAX  Take 0.4 mg by mouth daily. 11am daily       No Known Allergies     Follow-up  Information   Follow up with Renato Shin, MD In 1 week.   Specialty:  Endocrinology   Contact information:   [address] [place] [id number] [phone]        The results of significant diagnostics from this hospitalization (including imaging, microbiology, ancillary and laboratory) are listed below for reference.    Significant Diagnostic Studies: Dg Abd Acute W/chest  09/24/2013   CLINICAL DATA:  Cough, nausea and vomiting, weight loss, and fatigue.  EXAM: ACUTE ABDOMEN SERIES (ABDOMEN 2 VIEW & CHEST 1 VIEW)  COMPARISON:  Chest x-ray dated 08/16/2012 and MRI of the abdomen dated 06/18/2013  FINDINGS: Heart and lungs appear normal. No free air in the abdomen. Slight thoracolumbar scoliosis. No acute osseous abnormality.  Bowel gas pattern is normal.  IMPRESSION: Benign appearing abdomen and chest.   Electronically Signed   By: Rozetta Nunnery M.D.   On: 09/24/2013 19:07    Microbiology: Recent Results (from the  past 240 hour(s))  URINE CULTURE     Status: None   Collection Time    09/25/13  3:43 AM      Result Value Ref Range Status   Specimen Description URINE, CLEAN CATCH   Final   Special Requests NONE   Final   Culture  Setup Time     Final   Value: 09/25/2013 07:28     Performed at [place]     Final   Value: 9,000 COLONIES/ML     Performed at [facility]   Culture     Final   Value: INSIGNIFICANT GROWTH     Performed at [facility]   Report Status 09/26/2013 FINAL   Final     Labs: Basic Metabolic Panel:  Recent Labs Lab 09/24/13 2304 09/25/13 0114 09/25/13 0335 09/25/13 0520 09/27/13 0835  NA 132* 138 142 144 135*  K 5.8* 4.7 4.9 4.3 3.8  CL 95* 102 107 107 101  CO2 19 21 22 25 21   GLUCOSE 610* 378* 278* 205* 310*  BUN 46* 42* 38* 37* 21  CREATININE 2.09* 2.08* 2.00* 1.95* 1.64*  CALCIUM 9.8 10.0 9.2 9.4 8.7   Liver Function Tests:  Recent Labs Lab 09/24/13 1817 09/24/13 2304  AST  16 14  ALT 25 20  ALKPHOS 93 82  BILITOT 0.5 0.4  PROT 8.2 7.4  ALBUMIN 4.6 4.1    Recent Labs Lab 09/24/13 2304  LIPASE 101*   No results found for this basename: AMMONIA,  in the last 168 hours CBC:  Recent Labs Lab 09/24/13 1817  WBC 10.0  NEUTROABS 8.0*  HGB 16.9  HCT 47.1  MCV 89.7  PLT 252   Cardiac Enzymes: No results found for this basename: CKTOTAL, CKMB, CKMBINDEX, TROPONINI,  in the last 168 hours BNP: BNP (last 3 results) No results found for this basename: PROBNP,  in the last 8760 hours CBG:  Recent Labs Lab 09/26/13 1106 09/26/13 1623 09/26/13 2118 09/27/13 0615 09/27/13 1123  GLUCAP 298* 251* 184* 226* 310*       Signed:  [facility] 09/27/2013, 12:28 PM

## 2013-10-07 ENCOUNTER — Ambulatory Visit (INDEPENDENT_AMBULATORY_CARE_PROVIDER_SITE_OTHER): Payer: Commercial Managed Care - HMO | Admitting: Endocrinology

## 2013-10-07 ENCOUNTER — Encounter: Payer: Self-pay | Admitting: Endocrinology

## 2013-10-07 VITALS — BP 108/56 | HR 87 | Temp 98.5°F | Ht 67.0 in | Wt 202.0 lb

## 2013-10-07 DIAGNOSIS — E109 Type 1 diabetes mellitus without complications: Secondary | ICD-10-CM

## 2013-10-07 MED ORDER — GLUCOSE BLOOD VI STRP: 1.0000 | ORAL_STRIP | Freq: Two times a day (BID) | Status: DC

## 2013-10-07 MED ORDER — "INSULIN SYRINGE 31G X 5/16"" 0.5 ML MISC": 1.0000 | Freq: Four times a day (QID) | Status: DC

## 2013-10-07 NOTE — Patient Instructions (Addendum)
Here is a new meter.  i have sent a prescription to your pharmacy, for strips. check your blood sugar twice a day.  vary the time of day when you check, between before the 3 meals, and at bedtime.  also check if you have symptoms of your blood sugar being too high or too low.  please keep a record of the readings and bring it to your next appointment here.  You can write it on any piece of paper.  please call us sooner if your blood sugar goes below 70, or if you have a lot of readings over 200. Please come back for a follow-up appointment in 1 month.  Please change the novolog to 3 times a day (just before each meal) 01-25-09 units.

## 2013-10-07 NOTE — Progress Notes (Signed)
Subjective:    Patient ID: John Fry, male    DOB: 20-Jan-1941, 73 y.o.   MRN: [id number]  HPI In early 2015, pt presented with DKA.  He has no previous h/o DM.  He has mild if any neuropathy of the lower extremities.  No associated complications.  He was started on multiple daily injections.  no cbg record, but states cbg's vary from 100-200's.  It is highest at hs, and in am. It is lowest at lunch.  pt states he feels much better.   Past Medical History  Diagnosis Date  . RENAL INSUFFICIENCY 05/22/2008  . SHOULDER PAIN, RIGHT 05/08/2008  . VENTRICULAR HYPERTROPHY, LEFT 05/08/2008  . DYSLIPIDEMIA 03/05/2007  . HERNIA 03/05/2007  . HYPERTENSION 03/05/2007  . 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 Foot surgery  1999    Right Foot Bunion Repair  . Lower arterial  05/06/2003  . Prostate biopsy  07/23/13    gleason 7, volume 53 ml  . Remove fatty tumor Left 1995    thigh    History   Social History  . Marital Status: Married    Spouse Name: N/A    Number of Children: N/A  . Years of Education: N/A   Occupational History  .      Retired   Social History Main Topics  . Smoking status: Former Smoker -- 0.50 packs/day for 20 years    Quit date: 06/20/2006  . Smokeless tobacco: Not on file  . Alcohol Use: No  . Drug Use: No  . Sexual Activity: Not on file   Other Topics Concern  . Not on file   Social History Narrative  . No narrative on file    Current Outpatient Prescriptions on File Prior to Visit  Medication Sig Dispense Refill  . amLODipine (NORVASC) 5 MG tablet Take 1 tablet (5 mg total) by mouth daily.  30 tablet  1  . amoxicillin-clarithromycin-lansoprazole (PREVPAC) combo pack Take by mouth 2 (two) times daily. Follow package directions.  1 kit  0  . bd getting started take home kit MISC 1 kit by Other route once.  1 kit  0  . simvastatin (ZOCOR) 80 MG tablet Take 80 mg by mouth at  bedtime.      . tamsulosin (FLOMAX) 0.4 MG CAPS capsule Take 0.4 mg by mouth daily. 11am daily       No current facility-administered medications on file prior to visit.    No Known Allergies  Family History  Problem Relation Age of Onset  . Hypertension Neg Hx   . Cancer Father 71    prostate  . Kidney Stones Brother   . Diabetes Sister     BP 108/56  Pulse 87  Temp(Src) 98.5 F (36.9 C) (Oral)  Ht 5' 7"  (1.702 m)  Wt 202 lb (91.627 kg)  BMI 31.63 kg/m2  SpO2 98%  Review of Systems He denies hypoglycemia.  He has regained the weight he lost.      Objective:   Physical Exam VITAL SIGNS:  See vs page GENERAL: no distress  Lab Results  Component Value Date   HGBA1C 14.7* 09/24/2013   Lab Results  Component Value Date   CREATININE 1.64* 09/27/2013   BUN 21 09/27/2013   NA 135* 09/27/2013   K 3.8 09/27/2013   CL 101 09/27/2013   CO2 21 09/27/2013  Assessment & Plan:  DKA: resolved Type 1 DM, new onset: he needs increased rx Renal insufficiency: improved. Weight loss, due to acute illness: he has regained the lost weight.

## 2013-10-18 ENCOUNTER — Ambulatory Visit: Payer: Commercial Managed Care - HMO | Admitting: *Deleted

## 2013-10-22 ENCOUNTER — Encounter (HOSPITAL_COMMUNITY): Payer: Self-pay | Admitting: Emergency Medicine

## 2013-10-22 ENCOUNTER — Emergency Department (HOSPITAL_COMMUNITY)
Admission: EM | Admit: 2013-10-22 | Discharge: 2013-10-22 | Disposition: A | Discharge status: Home / Self Care | Payer: Medicare HMO | Source: Home / Self Care

## 2013-10-22 DIAGNOSIS — T783XXA Angioneurotic edema, initial encounter: Secondary | ICD-10-CM

## 2013-10-22 DIAGNOSIS — X58XXXA Exposure to other specified factors, initial encounter: Secondary | ICD-10-CM

## 2013-10-22 MED ORDER — AMOXICILLIN-POT CLAVULANATE 875-125 MG PO TABS: 1.0000 | ORAL_TABLET | Freq: Two times a day (BID) | ORAL | Status: DC

## 2013-10-22 MED ORDER — PREDNISONE 20 MG PO TABS: ORAL_TABLET | ORAL | Status: DC

## 2013-10-22 NOTE — Discharge Instructions (Signed)
Angioedema STOP THE LISINOPRIL BLOOD PRESSURE MEDICINE Angioedema is a sudden swelling of tissues, often of the skin. It can occur on the face or genitals or in the abdomen or other body parts. The swelling usually develops over a short period and gets better in 24 to 48 hours. It often begins during the night and is found when the person wakes up. The person may also get red, itchy patches of skin (hives). Angioedema can be dangerous if it involves swelling of the air passages.  Depending on the cause, episodes of angioedema may only happen once, come back in unpredictable patterns, or repeat for several years and then gradually fade away.  CAUSES  Angioedema can be caused by an allergic reaction to various triggers. It can also result from nonallergic causes, including reactions to drugs, immune system disorders, viral infections, or an abnormal gene that is passed to you from your parents (hereditary). For some people with angioedema, the cause is unknown.  Some things that can trigger angioedema include:   Foods.   Medicines, such as ACE inhibitors, ARBs, nonsteroidal anti-inflammatory agents, or estrogen.   Latex.   Animal saliva.   Insect stings.   Dyes used in X-rays.   Mild injury.   Dental work.  Surgery.  Stress.   Sudden changes in temperature.   Exercise. SIGNS AND SYMPTOMS   Swelling of the skin.  Hives. If these are present, there is also intense itching.  Redness in the affected area.   Pain in the affected area.  Swollen lips or tongue.  Breathing problems. This may happen if the air passages swell.  Wheezing. If internal organs are involved, there may be:   Nausea.   Abdominal pain.   Vomiting.   Difficulty swallowing.   Difficulty passing urine. DIAGNOSIS   Your health care provider will examine the affected area and take a medical and family history.  Various tests may be done to help determine the cause. Tests may  include:  Allergy skin tests to see if the problem is an allergic reaction.   Blood tests to check for hereditary angioedema.   Tests to check for underlying diseases that could cause the condition.   A review of your medicines, including over the counter medicines, may be done. TREATMENT  Treatment will depend on the cause of the angioedema. Possible treatments include:   Removal of anything that triggered the condition (such as stopping certain medicines).   Medicines to treat symptoms or prevent attacks. Medicines given may include:   Antihistamines.   Epinephrine injection.   Steroids.   Hospitalization may be required for severe attacks. If the air passages are affected, it can be an emergency. Tubes may need to be placed to keep the airway open. HOME CARE INSTRUCTIONS   Only take over-the-counter or prescription medicines as directed by your health care provider.  If you were given medicines for emergency allergy treatment, always carry them with you.  Wear a medical bracelet as directed by your health care provider.   Avoid known triggers. SEEK MEDICAL CARE IF:   You have repeat attacks of angioedema.   Your attacks are more frequent or more severe despite preventive measures.   You have hereditary angioedema and are considering having children. It is important to discuss the risks of passing the condition on to your children with your health care provider. SEEK IMMEDIATE MEDICAL CARE IF:   You have severe swelling of the mouth, tongue, or lips.  You have difficulty breathing.  You have difficulty swallowing.   You faint. MAKE SURE YOU:  Understand these instructions.  Will watch your condition.  Will get help right away if you are not doing well or get worse. Document Released: 08/15/2001 Document Revised: 03/27/2013 Document Reviewed: 01/28/2013 [facility] Patient Information 2014 [place], [place].  Drug Allergy Allergic reactions to  medicines are common. Some allergic reactions are mild. A delayed type of drug allergy that occurs 1 week or more after exposure to a medicine or vaccine is called serum sickness. A life-threatening, sudden (acute) allergic reaction that involves the whole body is called anaphylaxis. CAUSES  "True" drug allergies occur when there is an allergic reaction to a medicine. This is caused by overactivity of the immune system. First, the body becomes sensitized. The immune system is triggered by your first exposure to the medicine. Following this first exposure, future exposure to the same medicine may be life-threatening. Almost any medicine can cause an allergic reaction. Common ones are:  Penicillin.  Sulfonamides (sulfa drugs).  Local anesthetics.  X-ray dyes that contain iodine. SYMPTOMS  Common symptoms of a minor allergic reaction are:  Swelling around the mouth.  An itchy red rash or hives.  Vomiting or diarrhea. Anaphylaxis can cause swelling of the mouth and throat. This makes it difficult to breathe and swallow. Severe reactions can be fatal within seconds, even after exposure to only a trace amount of the drug that causes the reaction. HOME CARE INSTRUCTIONS   If you are unsure of what caused your reaction, keep a diary of foods and medicines used. Include the symptoms that followed. Avoid anything that causes reactions.  You may want to follow up with an allergy specialist after the reaction has cleared in order to be tested to confirm the allergy. It is important to confirm that your reaction is an allergy, not just a side effect to the medicine. If you have a true allergy to a medicine, this may prevent that medicine and related medicines from being given to you when you are very ill.  If you have hives or a rash:  Take medicines as directed by your caregiver.  You may use an over-the-counter antihistamine (diphenhydramine) as needed.  Apply cold compresses to the skin or  take baths in cool water. Avoid hot baths or showers.  If you are severely allergic:  Continuous observation after a severe reaction may be needed. Hospitalization is often required.  Wear a medical alert bracelet or necklace stating your allergy.  You and your family must learn how to use an anaphylaxis kit or give an epinephrine injection to temporarily treat an emergency allergic reaction. If you have had a severe reaction, always carry your epinephrine injection or anaphylaxis kit with you. This can be lifesaving if you have a severe reaction.  Do not drive or perform tasks after treatment until the medicines used to treat your reaction have worn off, or until your caregiver says it is okay. SEEK MEDICAL CARE IF:   You think you had an allergic reaction. Symptoms usually start within 30 minutes after exposure.  Symptoms are getting worse rather than better.  You develop new symptoms.  The symptoms that brought you to your caregiver return. SEEK IMMEDIATE MEDICAL CARE IF:   You have swelling of the mouth, difficulty breathing, or wheezing.  You have a tight feeling in your chest or throat.  You develop hives, swelling, or itching all over your body.  You develop severe vomiting or diarrhea.  You feel faint  or pass out. This is an emergency. Use your epinephrine injection or anaphylaxis kit as you have been instructed. Call for emergency medical help. Even if you improve after the injection, you need to be examined at a hospital emergency department. MAKE SURE YOU:   Understand these instructions.  Will watch your condition.  Will get help right away if you are not doing well or get worse. Document Released: 06/06/2005 Document Revised: 08/29/2011 Document Reviewed: 11/10/2010 [facility] Patient Information 2014 [place], [place].

## 2013-10-22 NOTE — ED Notes (Signed)
Patient c/o facial swelling onset this morning. Patient has not tried any new foods or medications. Patient reports he put ice on it with no relief. Patient denies SOB. Just feels tightness on his face. Patient is alert and oriented and in no acute distress.

## 2013-10-22 NOTE — ED Provider Notes (Signed)
CSN: [id number]     Arrival date & time 10/22/13  1215 History   First MD Initiated Contact with Patient 10/22/13 1514     Chief Complaint  Patient presents with  . Facial Swelling   (Consider location/radiation/quality/duration/timing/severity/associated sxs/prior Treatment) HPI Comments: 73 year old male presents with swelling of the lips and right anterior face. This morning the swelling began in the upper and lower lips and then later spread primarily to the right side of the face. It extends approximately 1/3 of the way toward the angle of the jaw. He denies itching, burning, pain or intraoral swelling. Denies toothache or facial pain. His medications include lisinopril and amlodipine.   Past Medical History  Diagnosis Date  . RENAL INSUFFICIENCY 05/22/2008  . SHOULDER PAIN, RIGHT 05/08/2008  . VENTRICULAR HYPERTROPHY, LEFT 05/08/2008  . DYSLIPIDEMIA 03/05/2007  . HERNIA 03/05/2007  . HYPERTENSION 03/05/2007  . 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 Foot surgery  1999    Right Foot Bunion Repair  . Lower arterial  05/06/2003  . Prostate biopsy  07/23/13    gleason 7, volume 53 ml  . Remove fatty tumor Left 1995    thigh   Family History  Problem Relation Age of Onset  . Hypertension Neg Hx   . Cancer Father 43    prostate  . Kidney Stones Brother   . Diabetes Sister    History  Substance Use Topics  . Smoking status: Former Smoker -- 0.50 packs/day for 20 years    Quit date: 06/20/2006  . Smokeless tobacco: Not on file  . Alcohol Use: No    Review of Systems  Constitutional: Negative for fever, activity change and fatigue.  HENT: Negative for congestion, ear discharge, ear pain, postnasal drip, rhinorrhea, sneezing, sore throat, trouble swallowing and voice change.   Respiratory: Negative for cough, shortness of breath and wheezing.   Cardiovascular: Negative.   Gastrointestinal: Negative.    Genitourinary: Negative.   Skin: Negative for color change, pallor, rash and wound.  Neurological: Positive for facial asymmetry and speech difficulty. Negative for tremors, seizures, syncope, numbness and headaches.    Allergies  Review of patient's allergies indicates no known allergies.  Home Medications   Prior to Admission medications   Medication Sig Start Date End Date Taking? Authorizing Provider  amLODipine (NORVASC) 5 MG tablet Take 1 tablet (5 mg total) by mouth daily. 09/27/13   Kelvin Cellar, MD  amoxicillin-clarithromycin-lansoprazole [facility]) combo pack Take by mouth 2 (two) times daily. Follow package directions. 09/27/13   Kelvin Cellar, MD  bd getting started take home kit MISC 1 kit by Other route once. 09/27/13   Kelvin Cellar, MD  glucose blood (ACCU-CHEK AVIVA) test strip 1 each by Other route 2 (two) times daily. And lancets 2/day 250.01 10/07/13   Renato Shin, MD  insulin aspart (NOVOLOG) 100 UNIT/ML injection Inject into the skin 3 (three) times daily before meals. 3 times a day (just before each meal) 01-25-09 units. 09/27/13   Kelvin Cellar, MD  insulin glargine (LANTUS) 100 UNIT/ML injection Inject 45 Units into the skin at bedtime. 09/27/13   Kelvin Cellar, MD  Insulin Syringe-Needle U-100 (INSULIN SYRINGE .5CC/31GX5/16") 31G X 5/16" 0.5 ML MISC 1 Device by Does not apply route 4 (four) times daily. 10/07/13   Renato Shin, MD  simvastatin (ZOCOR) 80 MG tablet Take 80 mg by mouth at bedtime.    Historical Provider, MD  tamsulosin (FLOMAX) 0.4 MG CAPS capsule Take 0.4 mg by mouth daily. 11am daily    Historical Provider, MD   BP 103/58  Pulse 64  Temp(Src) 97.9 F (36.6 C) (Oral)  Resp 18  SpO2 100% Physical Exam  Nursing note and vitals reviewed. Constitutional: He is oriented to person, place, and time. He appears well-developed and well-nourished. No distress.  HENT:  Mouth/Throat: Oropharynx is clear and moist. No oropharyngeal exudate.  No  intraoral swelling, edema or discoloration involving the oropharynx, soft palate, uvula or buccal mucosa. There is no dental tenderness or gingival swelling or erythema. There is swelling of the upper and lower lips primarily to the right side does extend across the midline. The swelling extends to the right side of the face chest beyond the nasolabial fold.  Eyes: Conjunctivae and EOM are normal.  Neck: Normal range of motion. Neck supple.  Cardiovascular: Normal rate and normal heart sounds.   Pulmonary/Chest: Effort normal and breath sounds normal.  Lymphadenopathy:    He has no cervical adenopathy.  Neurological: He is alert and oriented to person, place, and time. He exhibits normal muscle tone.  Skin: Skin is warm and dry. No rash noted. No erythema.    ED Course  Procedures (including critical care time) Labs Review Labs Reviewed - No data to display  Imaging Review No results found.   MDM   1. Angioedema of lips     Differential is angioedema, not involving the tongue or soft palate. Sialadenitis,  Other allergy. Stop Lisinopril and call PCP tomorrow Incase this may be sialadenitis augmentin x 7 d Prednisone 20 mg q d for 3 d for possible "other " type allergy, knowing it has not effect on ACEI induced angioedema. For any worsening go to the ED [place] locally.     Janne Napoleon, NP 10/22/13 [phone]

## 2013-10-22 NOTE — ED Provider Notes (Signed)
Medical screening examination/treatment/procedure(s) were performed by resident physician or non-physician practitioner and as supervising physician I was immediately available for consultation/collaboration.   Pauline Good MD.   Billy Fischer, MD 10/22/13 2150

## 2013-10-23 ENCOUNTER — Telehealth: Payer: Self-pay | Admitting: Endocrinology

## 2013-10-23 NOTE — Telephone Encounter (Signed)
See below and please advise, Thanks!  

## 2013-10-23 NOTE — Telephone Encounter (Signed)
Please move up your next ov to tomorrow or Friday.  We can f/u your diabetes then also.

## 2013-10-23 NOTE — Telephone Encounter (Signed)
Pt coming in for OV tomorrow at 10:30.

## 2013-10-23 NOTE — Telephone Encounter (Signed)
Patient states he was sent to urgent care and they advised him to stop his B/P medication  Patient wants to know is there is another medication he can be put on   Please advise patient   Thank You :)

## 2013-10-24 ENCOUNTER — Encounter: Payer: Self-pay | Admitting: Endocrinology

## 2013-10-24 ENCOUNTER — Ambulatory Visit (INDEPENDENT_AMBULATORY_CARE_PROVIDER_SITE_OTHER): Payer: Medicare HMO | Admitting: Endocrinology

## 2013-10-24 VITALS — BP 120/70 | HR 62 | Temp 97.9°F | Ht 67.0 in | Wt 199.0 lb

## 2013-10-24 DIAGNOSIS — E109 Type 1 diabetes mellitus without complications: Secondary | ICD-10-CM

## 2013-10-24 MED ORDER — HYDROCHLOROTHIAZIDE 12.5 MG PO TABS: 12.5000 mg | ORAL_TABLET | Freq: Every day | ORAL | Status: DC

## 2013-10-24 NOTE — Patient Instructions (Addendum)
Please stay-off the lisinopril-HCTZ i have sent a prescription to your pharmacy, to take just the HCTZ part. Please finish the prednisone and antibiotic.  Please reduce the lantus to 40 units at bedtime. check your blood sugar twice a day.  vary the time of day when you check, between before the 3 meals, and at bedtime.  also check if you have symptoms of your blood sugar being too high or too low.  please keep a record of the readings and bring it to your next appointment here.  You can write it on any piece of paper.  please call us sooner if your blood sugar goes below 70, or if you have a lot of readings over 200.  Please come back for a follow-up appointment in 2 weeks.

## 2013-10-24 NOTE — Progress Notes (Signed)
Subjective:    Patient ID: John Fry, male    DOB: 11-10-1940, 73 y.o.   MRN: [id number]  HPI Pt was seen at urgent care for swelling of the lower lip.  zestoretic was d/c'ed, and sxs are improved.  He was also rx'ed prednisone and augmentin.   Pt also returns for f/u of type 1 DM (dx'ed early 2015, when he presented with DKA; he had no previous h/o DM; he has mild if any neuropathy of the lower extremities; no associated chronic complications).  he brings a record of his cbg's which i have reviewed today.  It varies from 92-154, but most are approx 100.  There is no trend throughout the day, but he does not check at hs.   Past Medical History  Diagnosis Date  . RENAL INSUFFICIENCY 05/22/2008  . SHOULDER PAIN, RIGHT 05/08/2008  . VENTRICULAR HYPERTROPHY, LEFT 05/08/2008  . DYSLIPIDEMIA 03/05/2007  . HERNIA 03/05/2007  . HYPERTENSION 03/05/2007  . 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 Foot surgery  1999    Right Foot Bunion Repair  . Lower arterial  05/06/2003  . Prostate biopsy  07/23/13    gleason 7, volume 53 ml  . Remove fatty tumor Left 1995    thigh    History   Social History  . Marital Status: Married    Spouse Name: N/A    Number of Children: N/A  . Years of Education: N/A   Occupational History  .      Retired   Social History Main Topics  . Smoking status: Former Smoker -- 0.50 packs/day for 20 years    Quit date: 06/20/2006  . Smokeless tobacco: Not on file  . Alcohol Use: No  . Drug Use: No  . Sexual Activity: Not on file   Other Topics Concern  . Not on file   Social History Narrative  . No narrative on file    Current Outpatient Prescriptions on File Prior to Visit  Medication Sig Dispense Refill  . amLODipine (NORVASC) 5 MG tablet Take 1 tablet (5 mg total) by mouth daily.  30 tablet  1  . amoxicillin-clavulanate (AUGMENTIN) 875-125 MG per tablet Take 1 tablet by mouth  every 12 (twelve) hours.  14 tablet  0  . bd getting started take home kit MISC 1 kit by Other route once.  1 kit  0  . glucose blood (ACCU-CHEK AVIVA) test strip 1 each by Other route 2 (two) times daily. And lancets 2/day 250.01  100 each  12  . insulin aspart (NOVOLOG) 100 UNIT/ML injection Inject into the skin 3 (three) times daily before meals. 3 times a day (just before each meal) 01-25-09 units.      . insulin glargine (LANTUS) 100 UNIT/ML injection Inject 40 Units into the skin at bedtime.       . Insulin Syringe-Needle U-100 (INSULIN SYRINGE .5CC/31GX5/16") 31G X 5/16" 0.5 ML MISC 1 Device by Does not apply route 4 (four) times daily.  120 each  11  . predniSONE (DELTASONE) 20 MG tablet 2 tabs po once daily x 3 days  6 tablet  0  . simvastatin (ZOCOR) 80 MG tablet Take 80 mg by mouth at bedtime.      . tamsulosin (FLOMAX) 0.4 MG CAPS capsule Take 0.4 mg by mouth daily. 11am daily       No current facility-administered medications on  file prior to visit.    Allergies  Allergen Reactions  . Zestril [Lisinopril]     angioedema    Family History  Problem Relation Age of Onset  . Hypertension Neg Hx   . Cancer Father 37    prostate  . Kidney Stones Brother   . Diabetes Sister     BP 120/70  Pulse 62  Temp(Src) 97.9 F (36.6 C) (Oral)  Ht 5' 7"  (1.702 m)  Wt 199 lb (90.266 kg)  BMI 31.16 kg/m2  SpO2 97%  Review of Systems Denies sob and itching.    Objective:   Physical Exam VITAL SIGNS:  See vs page GENERAL: no distress Face: slight swelling of the lower lip. Ext: no edema.        Assessment & Plan:  Angioedema: better DM: this could be exac by prednisone, but he finishes that today. HTN: for now, we'll resume just the HCTZ.  However, he may soon also need to increase the norvasc.

## 2013-10-28 ENCOUNTER — Encounter: Payer: Medicare HMO | Attending: Internal Medicine | Admitting: Dietician

## 2013-10-28 ENCOUNTER — Encounter: Payer: Self-pay | Admitting: Dietician

## 2013-10-28 VITALS — Ht 67.0 in | Wt 199.7 lb

## 2013-10-28 DIAGNOSIS — Z713 Dietary counseling and surveillance: Secondary | ICD-10-CM | POA: Insufficient documentation

## 2013-10-28 DIAGNOSIS — E109 Type 1 diabetes mellitus without complications: Secondary | ICD-10-CM | POA: Insufficient documentation

## 2013-10-28 NOTE — Progress Notes (Signed)
Appt start time: 1530 end time:  1630.  Assessment:  Patient was seen on  10/28/13 for individual diabetes education. Pt recently dx type 1 DM very late in life after admission to hospital with DKA. No c-peptide or HOMA-IR on file to confirm low insulin production or rule out insulin resistance. His current insulin regimen seems to be controlling his BG very well, as pt reports consistent readings between 70 and 120 for over a week. He tests TID before breakfast, before lunch, and before bed.   Current HbA1c: 15.4  Preferred Learning Style:   No preference indicated   Learning Readiness:   Ready  MEDICATIONS:  See list  DIETARY INTAKE: Usual eating pattern includes 3 meals and 0-1 snacks per day. Everyday foods include chix or fish, unsweet tea.  Avoided foods include soda, kool aid, most sweets.    24-hr recall:  B ( AM): bacon, eggs, grits (butter), wheat bread, 1.2 cup orange juice, milk (2%) Snk ( AM): most days none, may have peanut butter crackers  L ( PM): baked fish, broccoli, baked chix, potatoes. Unsweetened tea Snk ( PM): none D ( PM): fried fish with a baked potato, hush puppies and a salad Snk ( PM): none Beverages: milk, orange juice, unsweet tea, water, EtOH none, no kool aid, occasional Gatorade  Usual physical activity: cuts grass every day, usually 2 lawns per day. No active hobbies. Walks a mile each night.  Progress Towards Goal(s):  In progress.   Nutritional Diagnosis:  NB-1.1 Food and nutrition-related knowledge deficit As related to lack of prior education on diabetes.  As evidenced by pt statements of same.    Intervention:  Nutrition counseling provided.  Discussed diabetes disease process and treatment options.  Discussed physiology of diabetes and role of obesity on insulin resistance.  Encouraged moderate weight reduction to improve glucose levels.  Discussed role of medications and diet in glucose control  Provided education on macronutrients on  glucose levels.  Provided education on carb counting, importance of regularly scheduled meals/snacks, and meal planning  Discussed effects of physical activity on glucose levels and long-term glucose control.  Recommended 150 minutes of physical activity/week.  Reviewed patient medications.  Discussed role of medication on blood glucose and possible side effects  Discussed blood glucose monitoring and interpretation.  Discussed recommended target ranges and individual ranges.    Described short-term complications: hyper- and hypo-glycemia.  Discussed causes,symptoms, and treatment options.  Discussed prevention, detection, and treatment of long-term complications.  Discussed the role of prolonged elevated glucose levels on body systems.  Discussed role of stress on blood glucose levels and discussed strategies to manage psychosocial issues.  Discussed recommendations for long-term diabetes self-care.  Established checklist for medical, dental, and emotional self-care.  Teaching Method Utilized:  Visual Auditory  Handouts given during visit include:  Living Well with Diabetes  The Plate Method  Carb Counting   Barriers to learning/adherence to lifestyle change: minimal  Diabetes self-care support plan:   [facility] support group  Pt's wife has helped him prepare BG logs  Demonstrated degree of understanding via:  Teach Back   Monitoring/Evaluation:  Dietary intake, exercise, portion control, and body weight prn.

## 2013-11-06 ENCOUNTER — Ambulatory Visit: Payer: Commercial Managed Care - HMO | Admitting: Endocrinology

## 2013-11-07 ENCOUNTER — Ambulatory Visit: Payer: Medicare HMO | Admitting: Endocrinology

## 2013-11-12 ENCOUNTER — Encounter: Payer: Self-pay | Admitting: Endocrinology

## 2013-11-12 ENCOUNTER — Ambulatory Visit (INDEPENDENT_AMBULATORY_CARE_PROVIDER_SITE_OTHER): Payer: Medicare HMO | Admitting: Endocrinology

## 2013-11-12 VITALS — BP 128/84 | HR 79 | Temp 98.6°F | Ht 67.0 in | Wt 204.0 lb

## 2013-11-12 DIAGNOSIS — E109 Type 1 diabetes mellitus without complications: Secondary | ICD-10-CM

## 2013-11-12 MED ORDER — INSULIN REGULAR HUMAN 100 UNIT/ML IJ SOLN: INTRAMUSCULAR | Status: DC

## 2013-11-12 MED ORDER — INSULIN NPH (HUMAN) (ISOPHANE) 100 UNIT/ML ~~LOC~~ SUSP: 30.0000 [IU] | Freq: Every day | SUBCUTANEOUS | Status: DC

## 2013-11-12 NOTE — Patient Instructions (Addendum)
Please change the lantus to NPH, 30 units at bedtime, and: Change the novolog to regular insulin, at the same amounts.   check your blood sugar twice a day.  vary the time of day when you check, between before the 3 meals, and at bedtime.  also check if you have symptoms of your blood sugar being too high or too low.  please keep a record of the readings and bring it to your next appointment here.  You can write it on any piece of paper.  please call us sooner if your blood sugar goes below 70, or if you have a lot of readings over 200.  Please come back for a follow-up appointment in 2 months.

## 2013-11-12 NOTE — Progress Notes (Signed)
Subjective:    Patient ID: John Fry, male    DOB: 02/01/1941, 73 y.o.   MRN: [id number]  HPI Pt also returns for f/u of type 1 DM (dx'ed early 2015, when he presented with DKA; he had no previous h/o DM; he has mild if any neuropathy of the lower extremities; no associated chronic complications; he has never had pancreatitis or severe hypoglycemia).  he brings a record of his cbg's which i have reviewed today.  It varies from 70-274, but most are approx 100.  There is no trend throughout the day, but he still does not check at hs.  He says he can't afford insulin analogs.  Past Medical History  Diagnosis Date  . RENAL INSUFFICIENCY 05/22/2008  . SHOULDER PAIN, RIGHT 05/08/2008  . VENTRICULAR HYPERTROPHY, LEFT 05/08/2008  . DYSLIPIDEMIA 03/05/2007  . HERNIA 03/05/2007  . HYPERTENSION 03/05/2007  . 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 Foot surgery  1999    Right Foot Bunion Repair  . Lower arterial  05/06/2003  . Prostate biopsy  07/23/13    gleason 7, volume 53 ml  . Remove fatty tumor Left 1995    thigh    History   Social History  . Marital Status: Married    Spouse Name: N/A    Number of Children: N/A  . Years of Education: N/A   Occupational History  .      Retired   Social History Main Topics  . Smoking status: Former Smoker -- 0.50 packs/day for 20 years    Quit date: 06/20/2006  . Smokeless tobacco: Not on file  . Alcohol Use: No  . Drug Use: No  . Sexual Activity: Not on file   Other Topics Concern  . Not on file   Social History Narrative  . No narrative on file    Current Outpatient Prescriptions on File Prior to Visit  Medication Sig Dispense Refill  . amLODipine (NORVASC) 5 MG tablet Take 1 tablet (5 mg total) by mouth daily.  30 tablet  1  . bd getting started take home kit MISC 1 kit by Other route once.  1 kit  0  . glucose blood (ACCU-CHEK AVIVA) test strip 1 each by  Other route 2 (two) times daily. And lancets 2/day 250.01  100 each  12  . hydrochlorothiazide (HYDRODIURIL) 12.5 MG tablet Take 1 tablet (12.5 mg total) by mouth daily.  90 tablet  3  . Insulin Syringe-Needle U-100 (INSULIN SYRINGE .5CC/31GX5/16") 31G X 5/16" 0.5 ML MISC 1 Device by Does not apply route 4 (four) times daily.  120 each  11  . simvastatin (ZOCOR) 80 MG tablet Take 80 mg by mouth at bedtime.      . tamsulosin (FLOMAX) 0.4 MG CAPS capsule Take 0.4 mg by mouth daily. 11am daily       No current facility-administered medications on file prior to visit.    Allergies  Allergen Reactions  . Zestril [Lisinopril]     angioedema    Family History  Problem Relation Age of Onset  . Hypertension Neg Hx   . Cancer Father 30    prostate  . Kidney Stones Brother   . Diabetes Sister     BP 128/84  Pulse 79  Temp(Src) 98.6 F (37 C) (Oral)  Ht 5' 7"  (1.702 m)  Wt 204 lb (92.534 kg)  BMI 31.94 kg/m2  SpO2 98%  Review of Systems Lip swelling has resolved.  Denies sob.     Objective:   Physical Exam VITAL SIGNS:  See vs page GENERAL: no distress PSYCH: Alert and well-oriented.  Does not appear anxious nor depressed.   Lab Results  Component Value Date   HGBA1C 14.7* 09/24/2013      Assessment & Plan:  DM: therapy limited by pt's request for least expensive meds.  Apparently slightly overcontrolled. HTN: well-controlled, but he may need increased rx in the future. Angioedema: resolved.  Patient Instructions  Please change the lantus to NPH, 30 units at bedtime, and: Change the novolog to regular insulin, at the same amounts.   check your blood sugar twice a day.  vary the time of day when you check, between before the 3 meals, and at bedtime.  also check if you have symptoms of your blood sugar being too high or too low.  please keep a record of the readings and bring it to your next appointment here.  You can write it on any piece of paper.  please call us sooner if  your blood sugar goes below 70, or if you have a lot of readings over 200.  Please come back for a follow-up appointment in 2 months.

## 2013-11-26 ENCOUNTER — Encounter: Payer: Self-pay | Admitting: Internal Medicine

## 2014-01-09 ENCOUNTER — Other Ambulatory Visit: Payer: Self-pay | Admitting: Endocrinology

## 2014-01-09 DIAGNOSIS — N139 Obstructive and reflux uropathy, unspecified: Secondary | ICD-10-CM

## 2014-01-10 ENCOUNTER — Other Ambulatory Visit: Payer: Self-pay | Admitting: Endocrinology

## 2014-01-27 ENCOUNTER — Ambulatory Visit (INDEPENDENT_AMBULATORY_CARE_PROVIDER_SITE_OTHER): Payer: Medicare HMO | Admitting: Endocrinology

## 2014-01-27 ENCOUNTER — Encounter: Payer: Self-pay | Admitting: Endocrinology

## 2014-01-27 VITALS — BP 136/90 | HR 67 | Temp 98.7°F | Ht 67.0 in | Wt 212.0 lb

## 2014-01-27 DIAGNOSIS — E109 Type 1 diabetes mellitus without complications: Secondary | ICD-10-CM

## 2014-01-27 LAB — HEMOGLOBIN A1C: Hgb A1c MFr Bld: 7.4 % — ABNORMAL HIGH (ref 4.6–6.5)

## 2014-01-27 LAB — MICROALBUMIN / CREATININE URINE RATIO
Creatinine,U: 197.1 mg/dL
Microalb Creat Ratio: 2.2 mg/g (ref 0.0–30.0)
Microalb, Ur: 4.3 mg/dL — ABNORMAL HIGH (ref 0.0–1.9)

## 2014-01-27 NOTE — Patient Instructions (Addendum)
blood and urine tests are requested for you today.  We'll contact you with results.   check your blood sugar twice a day.  vary the time of day when you check, between before the 3 meals, and at bedtime.  also check if you have symptoms of your blood sugar being too high or too low.  please keep a record of the readings and bring it to your next appointment here.  You can write it on any piece of paper.  please call us sooner if your blood sugar goes below 70, or if you have a lot of readings over 200.  Please come back for a regular physical appointment in 3 months.   Please continue the same blood-pressure medication for now.  We'll recheck this next time.   You may go through a time when you need less insulin (or maybe none at all).  Therefore, it is important to call if your blood sugar is low.  However, this time of needing less insulin is usually temporary.

## 2014-01-27 NOTE — Progress Notes (Signed)
Subjective:    Patient ID: John Fry, male    DOB: Aug 27, 1940, 73 y.o.   MRN: [id number]  HPI Pt also returns for f/u of type 1 DM (dx'ed early 2015, when he presented with DKA; he had no previous h/o DM; he has mild if any neuropathy of the lower extremities; no associated chronic complications; he has never had pancreatitis or severe hypoglycemia; he takes human insulin, due to cost).  he brings a record of his cbg's which i have reviewed today.  It varies from 72-200, but most are approx 100.  There is no trend throughout the day, but he still does not check at hs.   Past Medical History  Diagnosis Date  . RENAL INSUFFICIENCY 05/22/2008  . SHOULDER PAIN, RIGHT 05/08/2008  . VENTRICULAR HYPERTROPHY, LEFT 05/08/2008  . DYSLIPIDEMIA 03/05/2007  . HERNIA 03/05/2007  . HYPERTENSION 03/05/2007  . 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 Foot surgery  1999    Right Foot Bunion Repair  . Lower arterial  05/06/2003  . Prostate biopsy  07/23/13    gleason 7, volume 53 ml  . Remove fatty tumor Left 1995    thigh    History   Social History  . Marital Status: Married    Spouse Name: N/A    Number of Children: N/A  . Years of Education: N/A   Occupational History  .      Retired   Social History Main Topics  . Smoking status: Former Smoker -- 0.50 packs/day for 20 years    Quit date: 06/20/2006  . Smokeless tobacco: Not on file  . Alcohol Use: No  . Drug Use: No  . Sexual Activity: Not on file   Other Topics Concern  . Not on file   Social History Narrative  . No narrative on file    Current Outpatient Prescriptions on File Prior to Visit  Medication Sig Dispense Refill  . amLODipine (NORVASC) 5 MG tablet Take 1 tablet (5 mg total) by mouth daily.  30 tablet  1  . bd getting started take home kit MISC 1 kit by Other route once.  1 kit  0  . glucose blood (ACCU-CHEK AVIVA) test strip 1 each by  Other route 2 (two) times daily. And lancets 2/day 250.01  100 each  12  . hydrochlorothiazide (HYDRODIURIL) 12.5 MG tablet Take 1 tablet (12.5 mg total) by mouth daily.  90 tablet  3  . insulin NPH Human (HUMULIN N) 100 UNIT/ML injection Inject 0.3 mLs (30 Units total) into the skin at bedtime.  10 mL  11  . Insulin Syringe-Needle U-100 (INSULIN SYRINGE .5CC/31GX5/16") 31G X 5/16" 0.5 ML MISC 1 Device by Does not apply route 4 (four) times daily.  120 each  11  . simvastatin (ZOCOR) 80 MG tablet Take 80 mg by mouth at bedtime.      . tamsulosin (FLOMAX) 0.4 MG CAPS capsule Take 0.4 mg by mouth daily. 11am daily       No current facility-administered medications on file prior to visit.    Allergies  Allergen Reactions  . Zestril [Lisinopril]     angioedema    Family History  Problem Relation Age of Onset  . Hypertension Neg Hx   . Cancer Father 86    prostate  . Kidney Stones Brother   . Diabetes Sister     BP 136/90  Pulse 67  Temp(Src) 98.7 F (37.1 C) (Oral)  Ht 5' 7" (1.702 m)  Wt 212 lb (96.163 kg)  BMI 33.20 kg/m2  SpO2 97%   Review of Systems He denies hypoglycemia and weight change.      Objective:   Physical Exam VITAL SIGNS:  See vs page GENERAL: no distress SKIN:  Insulin injection sites at the anterior thighs are normal   Lab Results  Component Value Date   HGBA1C 7.4* 01/27/2014       Assessment & Plan:  DM: mild exacerbation. HTN: mild exacerbation, possibly situational. Weight-loss: resolved.  Pt is advised to minimize body weight.      Patient is advised the following: Patient Instructions  blood and urine tests are requested for you today.  We'll contact you with results.   check your blood sugar twice a day.  vary the time of day when you check, between before the 3 meals, and at bedtime.  also check if you have symptoms of your blood sugar being too high or too low.  please keep a record of the readings and bring it to your next  appointment here.  You can write it on any piece of paper.  please call us sooner if your blood sugar goes below 70, or if you have a lot of readings over 200.  Please come back for a regular physical appointment in 3 months.   Please continue the same blood-pressure medication for now.  We'll recheck this next time.   You may go through a time when you need less insulin (or maybe none at all).  Therefore, it is important to call if your blood sugar is low.  However, this time of needing less insulin is usually temporary.    Please increase regular insulin to 10 units 3 times a day (just before each meal)

## 2014-01-30 ENCOUNTER — Encounter: Payer: Self-pay | Admitting: Internal Medicine

## 2014-02-12 ENCOUNTER — Telehealth: Payer: Self-pay | Admitting: Endocrinology

## 2014-02-12 MED ORDER — SIMVASTATIN 80 MG PO TABS: 80.0000 mg | ORAL_TABLET | Freq: Every day | ORAL | Status: DC

## 2014-02-12 NOTE — Telephone Encounter (Signed)
Rx sent per pt's request.  

## 2014-02-12 NOTE — Telephone Encounter (Signed)
Pt needs symvastatin called in please he is completely out

## 2014-02-17 ENCOUNTER — Encounter: Payer: Self-pay | Admitting: Radiation Oncology

## 2014-02-17 NOTE — Progress Notes (Signed)
GU Location of Tumor / Histology: prostate   If Prostate Cancer, Gleason Score is (3 + 4) and PSA is 4.99 on 01/06/14 Previous PSA 5.34 on 05/21/13   Patient presented 9 months ago with signs/symptoms of: elevated PSA   Biopsies of prostate (if applicable) revealed:  123456  Volume 53 mL  Past/Anticipated interventions by urology, if any: antibiotics and repeat PSA   Past/Anticipated interventions by medical oncology, if any: none   Weight changes, if any: no   Bowel/Bladder complaints, if any: IPSS 3, nocturia x 1-2  Nausea/Vomiting, if any: no   Pain issues, if any: no   SAFETY ISSUES:  Prior radiation? no  Pacemaker/ICD? no  Possible current pregnancy? na  Is the patient on methotrexate? No  Current Complaints / other details: Married, 1 son, 1 daughter, retired from [employer] Pt diagnosed with diabetes since initial consultation.

## 2014-02-18 ENCOUNTER — Encounter: Payer: Self-pay | Admitting: Radiation Oncology

## 2014-02-18 ENCOUNTER — Telehealth: Payer: Self-pay | Admitting: *Deleted

## 2014-02-18 ENCOUNTER — Ambulatory Visit
Admission: RE | Admit: 2014-02-18 | Discharge: 2014-02-18 | Disposition: A | Discharge status: Home / Self Care | Payer: Medicare HMO | Source: Ambulatory Visit | Attending: Radiation Oncology | Admitting: Radiation Oncology

## 2014-02-18 VITALS — BP 167/92 | HR 66 | Temp 97.9°F | Resp 20 | Ht 67.0 in | Wt 215.0 lb

## 2014-02-18 DIAGNOSIS — C61 Malignant neoplasm of prostate: Secondary | ICD-10-CM

## 2014-02-18 DIAGNOSIS — E119 Type 2 diabetes mellitus without complications: Secondary | ICD-10-CM | POA: Insufficient documentation

## 2014-02-18 DIAGNOSIS — N529 Male erectile dysfunction, unspecified: Secondary | ICD-10-CM | POA: Insufficient documentation

## 2014-02-18 DIAGNOSIS — Z510 Encounter for antineoplastic radiation therapy: Secondary | ICD-10-CM | POA: Insufficient documentation

## 2014-02-18 HISTORY — DX: Type 2 diabetes mellitus without complications: E11.9

## 2014-02-18 NOTE — Progress Notes (Signed)
CC: Dr. Phebe Colla, Dr. Renato Shin  Diagnosis: Stage TI C. intermediate risk adenocarcinoma prostate  History: [name] is a pleasant 73 year old male who is seen today at the request of Dr. Tresa Moore for discussion of radiation therapy in the management of his stage TI C. intermediate risk adenocarcinoma prostate. I first saw the patient consultation on 09/05/2013. He  had a rising PSA since 2012. His PSA was 3.5 in 2012, 4.17 in October 2014 and 5.34 on 05/21/2013. He underwent ultrasound-guided biopsies by Dr. Tresa Moore on 07/23/2013. His was found have Gleason 7 (3+4) involving 10% of one core from the right lateral base and 20% of one core from the right lateral mid gland. He also had Gleason 6 (3+3) involving 80% of one core from the left apex. His gland volume was approximately 53 cc. within 2 weeks after seen in consultation he developed severe diabetes with a blood sugar over 800. His diabetes is now under good control. His PSA on 01/06/2014 was 4.99. He saw Dr. Tresa Moore on August 18,  and he is kindly referred back to me for discussion of possible radiation therapy. He is doing reasonably well from a GU and GI standpoint. His I PSS score is 3. He does have some degree erectile dysfunction and is not sexually active.  Physical examination: Alert and oriented. Filed Vitals:   02/18/14 1358  BP: 167/92  Pulse: 66  Temp: 97.9 F (36.6 C)  Resp: 20    Rectal: The prostate gland is normal in size and is without focal induration or nodularity.  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 I again discussed his management options and also potential acute and late toxicities of radiation therapy. Again, he would like to proceed with external beam/IMRT. We discussed treatment with a comfortably full bladder to minimize urinary toxicity.  We discussed the need for placement of 3 gold markers for image guidance. We will go ahead and set  this up with Dr. Tresa Moore. Consent is signed today.  Plan: As discussed above.  30 minutes was spent face-to-face with the patient, primarily counseling the patient and coordinating his care.

## 2014-02-18 NOTE — Telephone Encounter (Signed)
CALLED PATIENT TO INFORM OF GOLD SEED PLACEMENT ON 04-01-14 - ARRIVAL TIME - 1 PM @ DR. MANNY'S OFFICE, AND SIM APPT. ON 04-03-14- @ 9 AM @ DR. MURRAY'S OFFICE, LVM FOR A RETURN CALL

## 2014-04-02 ENCOUNTER — Telehealth: Payer: Self-pay | Admitting: *Deleted

## 2014-04-02 NOTE — Telephone Encounter (Signed)
CALLED PATIENT TO REMIND OF SIM APPT. FOR 04-03-14 @ 9 AM, SPOKE WITH PATIENT AND HE IS AWARE OF THIS APPT.

## 2014-04-03 ENCOUNTER — Ambulatory Visit
Admission: RE | Admit: 2014-04-03 | Discharge: 2014-04-03 | Disposition: A | Discharge status: Home / Self Care | Payer: Commercial Managed Care - HMO | Source: Ambulatory Visit | Attending: Radiation Oncology | Admitting: Radiation Oncology

## 2014-04-03 DIAGNOSIS — Z510 Encounter for antineoplastic radiation therapy: Secondary | ICD-10-CM | POA: Insufficient documentation

## 2014-04-03 DIAGNOSIS — C61 Malignant neoplasm of prostate: Secondary | ICD-10-CM | POA: Insufficient documentation

## 2014-04-03 NOTE — Progress Notes (Signed)
Complex simulation/treatment planning note: The patient was taken to the CT simulator. A Vac lock immobilization device was constructed. A red rubber tube was placed within the rectal vault. He was then catheterized and contrast instilled into the bladder/urethra. He was then scanned. I chose and isocenter along the prostate. The CT data set was sent to the MIM planning system I contoured his prostate (GTV), rectum, rectosigmoid, bladder, and seminal vesicles. The GTV will be expanded by 0.8 cm except for 0.5 cm along the rectum. This was 2 7800 cGy in 40 sessions. The seminal vesicle structural be expanded by 0.5 cm, and this will receive 5600 cGy in 40 sessions.

## 2014-04-07 ENCOUNTER — Encounter: Payer: Self-pay | Admitting: Radiation Oncology

## 2014-04-07 DIAGNOSIS — Z510 Encounter for antineoplastic radiation therapy: Secondary | ICD-10-CM | POA: Diagnosis not present

## 2014-04-07 NOTE — Progress Notes (Signed)
IMRT simulation/treatment planning note: The patient underwent IMRT planning today for treatment of his cancer of the prostate. IMRT was chosen to decrease the risk for both acute and late bladder and rectal toxicity compared to conventional or 3-D conformal radiation therapy. Dose volume histograms were obtained for the prostate, seminal vesicles, and avoidance structures. We met our departmental guidelines. I prescribing 7800 cGy in 40 sessions to his prostate PTV and 5600 cGy in 40 sessions to his seminal vesicle PTV. He will be treated with dual ARC VMAT IMRT/6 MV photons. He will undergo daily image guidance with cone beam CT and be treated with a comfortably full bladder.

## 2014-04-08 DIAGNOSIS — Z510 Encounter for antineoplastic radiation therapy: Secondary | ICD-10-CM | POA: Diagnosis not present

## 2014-04-10 DIAGNOSIS — Z510 Encounter for antineoplastic radiation therapy: Secondary | ICD-10-CM | POA: Diagnosis not present

## 2014-04-14 ENCOUNTER — Encounter: Payer: Self-pay | Admitting: Radiation Oncology

## 2014-04-14 ENCOUNTER — Ambulatory Visit
Admission: RE | Admit: 2014-04-14 | Discharge: 2014-04-14 | Disposition: A | Discharge status: Home / Self Care | Payer: Medicare HMO | Source: Ambulatory Visit | Attending: Radiation Oncology | Admitting: Radiation Oncology

## 2014-04-14 ENCOUNTER — Ambulatory Visit
Admission: RE | Admit: 2014-04-14 | Discharge: 2014-04-14 | Disposition: A | Discharge status: Home / Self Care | Payer: Commercial Managed Care - HMO | Source: Ambulatory Visit | Attending: Radiation Oncology | Admitting: Radiation Oncology

## 2014-04-14 VITALS — BP 140/98 | HR 68 | Temp 98.1°F | Resp 20 | Wt 216.0 lb

## 2014-04-14 DIAGNOSIS — Z510 Encounter for antineoplastic radiation therapy: Secondary | ICD-10-CM | POA: Diagnosis not present

## 2014-04-14 DIAGNOSIS — C61 Malignant neoplasm of prostate: Secondary | ICD-10-CM

## 2014-04-14 NOTE — Progress Notes (Signed)
Chart note: The patient began his IMRT today in the management of his carcinoma the prostate. He is being treated with dual ARC VMAT IMRT.  2 dynamic MLC is representing one set of IMRT treatment devices [id number]).

## 2014-04-14 NOTE — Progress Notes (Signed)
Patient denies pain, fatigue, urinary/bowel issues, loss of appetite. He states he has appointment with Dr Lynnell Grain, PCP next month and will discuss BP. He states he may call him today. Patient education completed with patient. Gave him "Radiation and You" booklet with all pertinent information marked and discussed, re: fatigue, rectal irritation/care, urinary/bladder irritation/management, nutrition, pain. Pt verbalized understanding. Teach back used.

## 2014-04-14 NOTE — Progress Notes (Signed)
Weekly Management Note:  Site: Prostate Current Dose:  195  cGy Projected Dose: 7800  cGy  Narrative: The patient is seen today for routine under treatment assessment. CBCT/MVCT images/port films were reviewed. The chart was reviewed.   Bladder filling is satisfactory. No new GU or GI difficulty.  Physical Examination:  Filed Vitals:   04/14/14 1004  BP: 140/98  Pulse: 68  Temp: 98.1 F (36.7 C)  Resp: 20  .  Weight: 216 lb (97.977 kg). No change.  Impression: Tolerating radiation therapy well.  Plan: Continue radiation therapy as planned.

## 2014-04-15 ENCOUNTER — Ambulatory Visit
Admission: RE | Admit: 2014-04-15 | Discharge: 2014-04-15 | Disposition: A | Discharge status: Home / Self Care | Payer: Commercial Managed Care - HMO | Source: Ambulatory Visit | Attending: Radiation Oncology | Admitting: Radiation Oncology

## 2014-04-15 DIAGNOSIS — Z510 Encounter for antineoplastic radiation therapy: Secondary | ICD-10-CM | POA: Diagnosis not present

## 2014-04-16 ENCOUNTER — Ambulatory Visit
Admission: RE | Admit: 2014-04-16 | Discharge: 2014-04-16 | Disposition: A | Discharge status: Home / Self Care | Payer: Commercial Managed Care - HMO | Source: Ambulatory Visit | Attending: Radiation Oncology | Admitting: Radiation Oncology

## 2014-04-16 DIAGNOSIS — Z510 Encounter for antineoplastic radiation therapy: Secondary | ICD-10-CM | POA: Diagnosis not present

## 2014-04-17 ENCOUNTER — Ambulatory Visit
Admission: RE | Admit: 2014-04-17 | Discharge: 2014-04-17 | Disposition: A | Discharge status: Home / Self Care | Payer: Commercial Managed Care - HMO | Source: Ambulatory Visit | Attending: Radiation Oncology | Admitting: Radiation Oncology

## 2014-04-17 DIAGNOSIS — Z510 Encounter for antineoplastic radiation therapy: Secondary | ICD-10-CM | POA: Diagnosis not present

## 2014-04-18 ENCOUNTER — Ambulatory Visit
Admission: RE | Admit: 2014-04-18 | Discharge: 2014-04-18 | Disposition: A | Discharge status: Home / Self Care | Payer: Commercial Managed Care - HMO | Source: Ambulatory Visit | Attending: Radiation Oncology | Admitting: Radiation Oncology

## 2014-04-18 DIAGNOSIS — Z510 Encounter for antineoplastic radiation therapy: Secondary | ICD-10-CM | POA: Diagnosis not present

## 2014-04-21 ENCOUNTER — Ambulatory Visit
Admission: RE | Admit: 2014-04-21 | Discharge: 2014-04-21 | Disposition: A | Discharge status: Home / Self Care | Payer: Commercial Managed Care - HMO | Source: Ambulatory Visit | Attending: Radiation Oncology | Admitting: Radiation Oncology

## 2014-04-21 ENCOUNTER — Inpatient Hospital Stay
Admission: RE | Admit: 2014-04-21 | Discharge: 2014-04-21 | Disposition: A | Discharge status: Home / Self Care | Payer: Self-pay | Source: Ambulatory Visit | Attending: Radiation Oncology | Admitting: Radiation Oncology

## 2014-04-21 ENCOUNTER — Encounter: Payer: Self-pay | Admitting: Radiation Oncology

## 2014-04-21 VITALS — BP 187/107 | HR 69 | Temp 98.1°F | Resp 20 | Wt 217.2 lb

## 2014-04-21 DIAGNOSIS — Z510 Encounter for antineoplastic radiation therapy: Secondary | ICD-10-CM | POA: Diagnosis not present

## 2014-04-21 DIAGNOSIS — C61 Malignant neoplasm of prostate: Secondary | ICD-10-CM

## 2014-04-21 NOTE — Progress Notes (Signed)
Weekly Management Note:  Site:prostate Current Dose:  1170  cGy Projected Dose: 7800  cGy  Narrative: The patient is seen today for routine under treatment assessment. CBCT/MVCT images/port films were reviewed. The chart was reviewed.   Bladder filling is satisfactory. He may be having more frequency, but this may be related to his increase fluid intake to fill his bladder. He is to see Dr. Loanne Drilling regarding his hypertension. He was recently changed to a new medication.  Physical Examination:  Filed Vitals:   04/21/14 0942  BP: 187/107  Pulse: 69  Temp: 98.1 F (36.7 C)  Resp: 20  .  Weight: 217 lb 3.2 oz (98.521 kg). No change.  Impression: Tolerating radiation therapy well.  Plan: Continue radiation therapy as planned.

## 2014-04-21 NOTE — Progress Notes (Signed)
Patient denies pain, HA, fatigue, bowel issues. He states he "may be urinating more often". He denies other urinary issues. Strongly encouraged pt to contact his PCP, Dr Loanne Drilling today re: BP readings. Gave him his last three readings. Patient stated he will call when he gets home.

## 2014-04-22 ENCOUNTER — Ambulatory Visit: Payer: Commercial Managed Care - HMO

## 2014-04-23 ENCOUNTER — Ambulatory Visit
Admission: RE | Admit: 2014-04-23 | Discharge: 2014-04-23 | Disposition: A | Discharge status: Home / Self Care | Payer: Commercial Managed Care - HMO | Source: Ambulatory Visit | Attending: Radiation Oncology | Admitting: Radiation Oncology

## 2014-04-23 DIAGNOSIS — Z510 Encounter for antineoplastic radiation therapy: Secondary | ICD-10-CM | POA: Diagnosis not present

## 2014-04-24 ENCOUNTER — Ambulatory Visit
Admission: RE | Admit: 2014-04-24 | Discharge: 2014-04-24 | Disposition: A | Discharge status: Home / Self Care | Payer: Commercial Managed Care - HMO | Source: Ambulatory Visit | Attending: Radiation Oncology | Admitting: Radiation Oncology

## 2014-04-24 DIAGNOSIS — Z510 Encounter for antineoplastic radiation therapy: Secondary | ICD-10-CM | POA: Diagnosis not present

## 2014-04-25 ENCOUNTER — Ambulatory Visit
Admission: RE | Admit: 2014-04-25 | Discharge: 2014-04-25 | Disposition: A | Discharge status: Home / Self Care | Payer: Commercial Managed Care - HMO | Source: Ambulatory Visit | Attending: Radiation Oncology | Admitting: Radiation Oncology

## 2014-04-25 DIAGNOSIS — Z510 Encounter for antineoplastic radiation therapy: Secondary | ICD-10-CM | POA: Diagnosis not present

## 2014-04-28 ENCOUNTER — Ambulatory Visit
Admission: RE | Admit: 2014-04-28 | Discharge: 2014-04-28 | Disposition: A | Discharge status: Home / Self Care | Payer: Medicare HMO | Source: Ambulatory Visit | Attending: Radiation Oncology | Admitting: Radiation Oncology

## 2014-04-28 ENCOUNTER — Ambulatory Visit
Admission: RE | Admit: 2014-04-28 | Discharge: 2014-04-28 | Disposition: A | Discharge status: Home / Self Care | Payer: Commercial Managed Care - HMO | Source: Ambulatory Visit | Attending: Radiation Oncology | Admitting: Radiation Oncology

## 2014-04-28 ENCOUNTER — Encounter: Payer: Self-pay | Admitting: Radiation Oncology

## 2014-04-28 VITALS — BP 168/98 | HR 72 | Temp 97.6°F | Resp 20 | Wt 216.7 lb

## 2014-04-28 DIAGNOSIS — C61 Malignant neoplasm of prostate: Secondary | ICD-10-CM

## 2014-04-28 DIAGNOSIS — Z510 Encounter for antineoplastic radiation therapy: Secondary | ICD-10-CM | POA: Diagnosis not present

## 2014-04-28 NOTE — Progress Notes (Signed)
Weekly prostate rad txs completed, b/p up 168/98, takes norvasc 5mg . No dysuria, hematuria on last Thursday  Has resolved, , stream good,nocturia x 2, , regular bowel movements, eating well, drinks plenty water,when he starts chewing his cheek below eye pain radiates up to over brow for about 5-7 seconds then resolves

## 2014-04-28 NOTE — Progress Notes (Signed)
   Weekly Management Note:  outpatient    ICD-9-CM ICD-10-CM   1. Prostate cancer 185 C61     Current Dose:  19.5 Gy  Projected Dose: 78 Gy   Narrative:  The patient presents for routine under treatment assessment.  CBCT/MVCT images/Port film x-rays were reviewed.  The chart was checked. No new complaints related to  RT  Physical Findings:  weight is 216 lb 11.2 oz (98.294 kg). His oral temperature is 97.6 F (36.4 C). His blood pressure is 168/98 and his pulse is 72. His respiration is 20.  NAD  Impression:  The patient is tolerating radiotherapy.  Plan:  Continue radiotherapy as planned.   ________________________________   Eppie Gibson, M.D.

## 2014-04-29 ENCOUNTER — Encounter: Payer: Self-pay | Admitting: Endocrinology

## 2014-04-29 ENCOUNTER — Ambulatory Visit (INDEPENDENT_AMBULATORY_CARE_PROVIDER_SITE_OTHER): Payer: Commercial Managed Care - HMO | Admitting: Endocrinology

## 2014-04-29 ENCOUNTER — Ambulatory Visit
Admission: RE | Admit: 2014-04-29 | Discharge: 2014-04-29 | Disposition: A | Discharge status: Home / Self Care | Payer: Commercial Managed Care - HMO | Source: Ambulatory Visit | Attending: Radiation Oncology | Admitting: Radiation Oncology

## 2014-04-29 VITALS — BP 144/94 | HR 68 | Temp 98.0°F | Ht 67.0 in | Wt 216.0 lb

## 2014-04-29 DIAGNOSIS — Z0189 Encounter for other specified special examinations: Secondary | ICD-10-CM

## 2014-04-29 DIAGNOSIS — E109 Type 1 diabetes mellitus without complications: Secondary | ICD-10-CM

## 2014-04-29 DIAGNOSIS — Z125 Encounter for screening for malignant neoplasm of prostate: Secondary | ICD-10-CM

## 2014-04-29 DIAGNOSIS — E785 Hyperlipidemia, unspecified: Secondary | ICD-10-CM

## 2014-04-29 DIAGNOSIS — Z0000 Encounter for general adult medical examination without abnormal findings: Secondary | ICD-10-CM | POA: Insufficient documentation

## 2014-04-29 DIAGNOSIS — N189 Chronic kidney disease, unspecified: Secondary | ICD-10-CM

## 2014-04-29 DIAGNOSIS — E119 Type 2 diabetes mellitus without complications: Secondary | ICD-10-CM | POA: Insufficient documentation

## 2014-04-29 DIAGNOSIS — E1022 Type 1 diabetes mellitus with diabetic chronic kidney disease: Secondary | ICD-10-CM

## 2014-04-29 DIAGNOSIS — Z510 Encounter for antineoplastic radiation therapy: Secondary | ICD-10-CM | POA: Diagnosis not present

## 2014-04-29 LAB — BASIC METABOLIC PANEL
BUN: 22 mg/dL (ref 6–23)
CHLORIDE: 105 meq/L (ref 96–112)
CO2: 25 mEq/L (ref 19–32)
CREATININE: 1.7 mg/dL — AB (ref 0.4–1.5)
Calcium: 9.6 mg/dL (ref 8.4–10.5)
GFR: 51.63 mL/min — ABNORMAL LOW (ref >60.00)
GLUCOSE: 127 mg/dL — AB (ref 70–99)
POTASSIUM: 4.2 meq/L (ref 3.5–5.1)
Sodium: 140 mEq/L (ref 135–145)

## 2014-04-29 LAB — CBC WITH DIFFERENTIAL/PLATELET
BASOS ABS: 0 10*3/uL (ref 0.0–0.1)
Basophils Relative: 0.3 % (ref 0.0–3.0)
EOS PCT: 4.4 % (ref 0.0–5.0)
Eosinophils Absolute: 0.3 10*3/uL (ref 0.0–0.7)
HCT: 46.7 % (ref 39.0–52.0)
Hemoglobin: 15.3 g/dL (ref 13.0–17.0)
LYMPHS PCT: 21.6 % (ref 12.0–46.0)
Lymphs Abs: 1.6 10*3/uL (ref 0.7–4.0)
MCHC: 32.7 g/dL (ref 30.0–36.0)
MCV: 92 fl (ref 78.0–100.0)
MONOS PCT: 8.2 % (ref 3.0–12.0)
Monocytes Absolute: 0.6 10*3/uL (ref 0.1–1.0)
NEUTROS PCT: 65.5 % (ref 43.0–77.0)
Neutro Abs: 4.7 10*3/uL (ref 1.4–7.7)
PLATELETS: 201 10*3/uL (ref 150.0–400.0)
RBC: 5.07 Mil/uL (ref 4.22–5.81)
RDW: 14.6 % (ref 11.5–15.5)
WBC: 7.2 10*3/uL (ref 4.0–10.5)

## 2014-04-29 LAB — URINALYSIS, ROUTINE W REFLEX MICROSCOPIC
BILIRUBIN URINE: NEGATIVE
Ketones, ur: NEGATIVE
LEUKOCYTES UA: NEGATIVE
Nitrite: NEGATIVE
PH: 6 (ref 5.0–8.0)
Specific Gravity, Urine: 1.025 (ref 1.000–1.030)
TOTAL PROTEIN, URINE-UPE24: NEGATIVE
Urine Glucose: NEGATIVE
Urobilinogen, UA: 0.2 (ref 0.0–1.0)

## 2014-04-29 LAB — HEPATIC FUNCTION PANEL
ALBUMIN: 3.8 g/dL (ref 3.5–5.2)
ALK PHOS: 54 U/L (ref 39–117)
ALT: 21 U/L (ref 0–53)
AST: 23 U/L (ref 0–37)
Bilirubin, Direct: 0.1 mg/dL (ref 0.0–0.3)
TOTAL PROTEIN: 7.1 g/dL (ref 6.0–8.3)
Total Bilirubin: 0.6 mg/dL (ref 0.2–1.2)

## 2014-04-29 LAB — LIPID PANEL
CHOL/HDL RATIO: 4
Cholesterol: 133 mg/dL (ref 0–200)
HDL: 33.7 mg/dL — AB (ref >39.00)
LDL Cholesterol: 70 mg/dL (ref 0–99)
NonHDL: 99.3
Triglycerides: 145 mg/dL (ref 0.0–149.0)
VLDL: 29 mg/dL (ref 0.0–40.0)

## 2014-04-29 LAB — URIC ACID: Uric Acid, Serum: 7.2 mg/dL (ref 4.0–7.8)

## 2014-04-29 LAB — MICROALBUMIN / CREATININE URINE RATIO
CREATININE, U: 181.5 mg/dL
MICROALB UR: 1.7 mg/dL (ref 0.0–1.9)
Microalb Creat Ratio: 0.9 mg/g (ref 0.0–30.0)

## 2014-04-29 LAB — HEMOGLOBIN A1C: Hgb A1c MFr Bld: 7.8 % — ABNORMAL HIGH (ref 4.6–6.5)

## 2014-04-29 LAB — TSH: TSH: 1.79 u[IU]/mL (ref 0.35–4.50)

## 2014-04-29 MED ORDER — AMLODIPINE BESYLATE 10 MG PO TABS: 10.0000 mg | ORAL_TABLET | Freq: Every day | ORAL | Status: DC

## 2014-04-29 MED ORDER — INSULIN REGULAR HUMAN 100 UNIT/ML IJ SOLN: INTRAMUSCULAR | Status: DC

## 2014-04-29 MED ORDER — ATORVASTATIN CALCIUM 40 MG PO TABS: 40.0000 mg | ORAL_TABLET | Freq: Every day | ORAL | Status: DC

## 2014-04-29 NOTE — Progress Notes (Signed)
Subjective:    Patient ID: John Fry, male    DOB: [birth year], 73 y.o.   MRN: [id number]  HPI  Pt returns for f/u of diabetes mellitus: DM type: 1 Dx'ed: 7510 Complications: none Therapy: insulin since dx DKA: only at dx Severe hypoglycemia: never Pancreatitis: never Other:  he takes human insulin, due to cost; he takes multiple daily injections Interval history: he brings a record of his cbg's which i have reviewed today.  It varies from 83-200, but most are in the low-100's.  He checks at breakfast and afternoon only.   Pt states 1 week of pain at the left maxillary area, worse in the context of chewing, but no assoc ulcer. Past Medical History  Diagnosis Date  . RENAL INSUFFICIENCY 05/22/2008  . SHOULDER PAIN, RIGHT 05/08/2008  . VENTRICULAR HYPERTROPHY, LEFT 05/08/2008  . DYSLIPIDEMIA 03/05/2007  . HERNIA 03/05/2007  . HYPERTENSION 03/05/2007  . 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 Diabetes mellitus without complication     Past Surgical History  Procedure Laterality Date  . Foot surgery  1999    Right Foot Bunion Repair  . Lower arterial  05/06/2003  . Prostate biopsy  07/23/13    gleason 7, volume 53 ml  . Remove fatty tumor Left 1995    thigh    History   Social History  . Marital Status: Married    Spouse Name: N/A    Number of Children: N/A  . Years of Education: N/A   Occupational History  .      Retired   Social History Main Topics  . Smoking status: Former Smoker -- 0.50 packs/day for 20 years    Quit date: 06/20/2006  . Smokeless tobacco: Not on file  . Alcohol Use: No  . Drug Use: No  . Sexual Activity: Not on file   Other Topics Concern  . Not on file   Social History Narrative    Current Outpatient Prescriptions on File Prior to Visit  Medication Sig Dispense Refill  . bd getting started take home kit MISC 1 kit by Other route once. 1 kit 0  . glucose blood (ACCU-CHEK AVIVA) test strip 1 each by Other route 2  (two) times daily. And lancets 2/day 250.01 100 each 12  . hydrochlorothiazide (HYDRODIURIL) 12.5 MG tablet Take 1 tablet (12.5 mg total) by mouth daily. 90 tablet 3  . insulin NPH Human (HUMULIN N) 100 UNIT/ML injection Inject 0.3 mLs (30 Units total) into the skin at bedtime. 10 mL 11  . Insulin Syringe-Needle U-100 (INSULIN SYRINGE .5CC/31GX5/16") 31G X 5/16" 0.5 ML MISC 1 Device by Does not apply route 4 (four) times daily. 120 each 11  . tamsulosin (FLOMAX) 0.4 MG CAPS capsule Take 0.4 mg by mouth daily. 11am daily     No current facility-administered medications on file prior to visit.    Allergies  Allergen Reactions  . Zestril [Lisinopril]     angioedema    Family History  Problem Relation Age of Onset  . Hypertension Neg Hx   . Cancer Father 52    prostate  . Kidney Stones Brother   . Diabetes Sister     BP 144/94 mmHg  Pulse 68  Temp(Src) 98 F (36.7 C) (Oral)  Ht 5' 7"  (1.702 m)  Wt 216 lb (97.977 kg)  BMI 33.82 kg/m2  SpO2 98%  Review of Systems He denies hypoglycemia and n/v.      Objective:  Physical Exam VITAL SIGNS:  See vs page. GENERAL: no distress. Mouth: no ulcer.  Pulses: dorsalis pedis intact bilat.   Feet: no deformity.  no edema.  Skin:  no ulcer on the feet.  normal color and temp. Neuro: sensation is intact to touch on the feet.   Lab Results  Component Value Date   HGBA1C 7.8* 04/29/2014       Assessment & Plan:  Facial pain: I advised ref ENT: Pt declines for now HTN: mild exacerbation DM: mild exacerbation Dyslipidemia: he need to change rx, due to drug interaction.    Patient is advised the following: Patient Instructions  blood tests are being requested for you today.  We'll contact you with results.   Please come in soon for your regular physical.   check your blood sugar twice a day.  vary the time of day when you check, between before the 3 meals, and at bedtime.  also check if you have symptoms of your blood sugar being  too high or too low.  please keep a record of the readings and bring it to your next appointment here.  You can write it on any piece of paper.  please call us sooner if your blood sugar goes below 70, or if you have a lot of readings over 200.   Please double the amlodipine.  i have sent a prescription to your pharmacy Also, please change the simvastatin to atorvastatin.  i have sent a prescription to your pharmacy Please call if the pain on your left face persists, so i can refer you to a specialist.

## 2014-04-29 NOTE — Patient Instructions (Addendum)
blood tests are being requested for you today.  We'll contact you with results.   Please come in soon for your regular physical.   check your blood sugar twice a day.  vary the time of day when you check, between before the 3 meals, and at bedtime.  also check if you have symptoms of your blood sugar being too high or too low.  please keep a record of the readings and bring it to your next appointment here.  You can write it on any piece of paper.  please call us sooner if your blood sugar goes below 70, or if you have a lot of readings over 200.   Please double the amlodipine.  i have sent a prescription to your pharmacy Also, please change the simvastatin to atorvastatin.  i have sent a prescription to your pharmacy Please call if the pain on your left face persists, so i can refer you to a specialist.

## 2014-04-30 ENCOUNTER — Ambulatory Visit
Admission: RE | Admit: 2014-04-30 | Discharge: 2014-04-30 | Disposition: A | Discharge status: Home / Self Care | Payer: Commercial Managed Care - HMO | Source: Ambulatory Visit | Attending: Radiation Oncology | Admitting: Radiation Oncology

## 2014-04-30 DIAGNOSIS — Z510 Encounter for antineoplastic radiation therapy: Secondary | ICD-10-CM | POA: Diagnosis not present

## 2014-04-30 LAB — PTH, INTACT AND CALCIUM
Calcium: 9.9 mg/dL (ref 8.4–10.5)
PTH: 109 pg/mL — ABNORMAL HIGH (ref 14–64)

## 2014-05-01 ENCOUNTER — Ambulatory Visit
Admission: RE | Admit: 2014-05-01 | Discharge: 2014-05-01 | Disposition: A | Discharge status: Home / Self Care | Payer: Commercial Managed Care - HMO | Source: Ambulatory Visit | Attending: Radiation Oncology | Admitting: Radiation Oncology

## 2014-05-01 DIAGNOSIS — Z510 Encounter for antineoplastic radiation therapy: Secondary | ICD-10-CM | POA: Diagnosis not present

## 2014-05-02 ENCOUNTER — Ambulatory Visit
Admission: RE | Admit: 2014-05-02 | Discharge: 2014-05-02 | Disposition: A | Discharge status: Home / Self Care | Payer: Commercial Managed Care - HMO | Source: Ambulatory Visit | Attending: Radiation Oncology | Admitting: Radiation Oncology

## 2014-05-02 DIAGNOSIS — Z510 Encounter for antineoplastic radiation therapy: Secondary | ICD-10-CM | POA: Diagnosis not present

## 2014-05-05 ENCOUNTER — Ambulatory Visit
Admission: RE | Admit: 2014-05-05 | Discharge: 2014-05-05 | Disposition: A | Discharge status: Home / Self Care | Payer: Medicare HMO | Source: Ambulatory Visit | Attending: Radiation Oncology | Admitting: Radiation Oncology

## 2014-05-05 ENCOUNTER — Encounter: Payer: Self-pay | Admitting: Radiation Oncology

## 2014-05-05 ENCOUNTER — Ambulatory Visit
Admission: RE | Admit: 2014-05-05 | Discharge: 2014-05-05 | Disposition: A | Discharge status: Home / Self Care | Payer: Commercial Managed Care - HMO | Source: Ambulatory Visit | Attending: Radiation Oncology | Admitting: Radiation Oncology

## 2014-05-05 VITALS — BP 151/78 | HR 63 | Temp 98.0°F | Resp 20

## 2014-05-05 DIAGNOSIS — C61 Malignant neoplasm of prostate: Secondary | ICD-10-CM

## 2014-05-05 DIAGNOSIS — Z510 Encounter for antineoplastic radiation therapy: Secondary | ICD-10-CM | POA: Diagnosis not present

## 2014-05-05 MED ORDER — TAMSULOSIN HCL 0.4 MG PO CAPS: 0.4000 mg | ORAL_CAPSULE | Freq: Every day | ORAL | Status: DC

## 2014-05-05 NOTE — Progress Notes (Signed)
Patient denies pain, fatigue, loss of appetite, bowel issues. He states he is on new BP med, name unknown. He will bring med name in tomorrow. Pt states he has urinary freq every 30 min - 1 hr, incomplete emptying. He was taking Flomax but has not taken since Sept, no refills. He has occas slight dysuria.

## 2014-05-05 NOTE — Progress Notes (Signed)
Weekly Management Note:  Site:prostate Current Dose:  2925  cGy Projected Dose: 7800  cGy  Narrative: The patient is seen today for routine under treatment assessment. CBCT/MVCT images/port films were reviewed. The chart was reviewed.   Bladder filling is satisfactory. He does report more slowing of his urinary stream and worsening urinary frequency. He also has mild dysuria.  He is been off his Flomax for approximate 3 months. He wants to restart this.  Physical Examination:  Filed Vitals:   05/05/14 0933  BP: 151/78  Pulse: 63  Temp: 98 F (36.7 C)  Resp: 20  .  Weight:  . No change.  Impression: Tolerating radiation therapy well, except for worsening obstructive symptomatology. I will restart his Flomax. He may also take ibuprofen when necessary for dysuria.  Plan: Continue radiation therapy as planned.

## 2014-05-06 ENCOUNTER — Ambulatory Visit
Admission: RE | Admit: 2014-05-06 | Discharge: 2014-05-06 | Disposition: A | Discharge status: Home / Self Care | Payer: Commercial Managed Care - HMO | Source: Ambulatory Visit | Attending: Radiation Oncology | Admitting: Radiation Oncology

## 2014-05-06 DIAGNOSIS — Z510 Encounter for antineoplastic radiation therapy: Secondary | ICD-10-CM | POA: Diagnosis not present

## 2014-05-07 ENCOUNTER — Ambulatory Visit
Admission: RE | Admit: 2014-05-07 | Discharge: 2014-05-07 | Disposition: A | Discharge status: Home / Self Care | Payer: Commercial Managed Care - HMO | Source: Ambulatory Visit | Attending: Radiation Oncology | Admitting: Radiation Oncology

## 2014-05-07 DIAGNOSIS — Z510 Encounter for antineoplastic radiation therapy: Secondary | ICD-10-CM | POA: Diagnosis not present

## 2014-05-08 ENCOUNTER — Ambulatory Visit
Admission: RE | Admit: 2014-05-08 | Discharge: 2014-05-08 | Disposition: A | Discharge status: Home / Self Care | Payer: Commercial Managed Care - HMO | Source: Ambulatory Visit | Attending: Radiation Oncology | Admitting: Radiation Oncology

## 2014-05-08 DIAGNOSIS — Z510 Encounter for antineoplastic radiation therapy: Secondary | ICD-10-CM | POA: Diagnosis not present

## 2014-05-09 ENCOUNTER — Ambulatory Visit
Admission: RE | Admit: 2014-05-09 | Discharge: 2014-05-09 | Disposition: A | Discharge status: Home / Self Care | Payer: Commercial Managed Care - HMO | Source: Ambulatory Visit | Attending: Radiation Oncology | Admitting: Radiation Oncology

## 2014-05-09 DIAGNOSIS — Z510 Encounter for antineoplastic radiation therapy: Secondary | ICD-10-CM | POA: Diagnosis not present

## 2014-05-11 ENCOUNTER — Ambulatory Visit
Admission: RE | Admit: 2014-05-11 | Discharge: 2014-05-11 | Disposition: A | Discharge status: Home / Self Care | Payer: Commercial Managed Care - HMO | Source: Ambulatory Visit | Attending: Radiation Oncology | Admitting: Radiation Oncology

## 2014-05-11 ENCOUNTER — Ambulatory Visit
Admission: RE | Admit: 2014-05-11 | Discharge: 2014-05-11 | Disposition: A | Discharge status: Home / Self Care | Payer: Medicare HMO | Source: Ambulatory Visit | Attending: Radiation Oncology | Admitting: Radiation Oncology

## 2014-05-11 ENCOUNTER — Encounter: Payer: Self-pay | Admitting: Radiation Oncology

## 2014-05-11 VITALS — BP 153/73 | HR 59 | Temp 97.2°F | Resp 16 | Wt 215.9 lb

## 2014-05-11 DIAGNOSIS — C61 Malignant neoplasm of prostate: Secondary | ICD-10-CM

## 2014-05-11 DIAGNOSIS — Z510 Encounter for antineoplastic radiation therapy: Secondary | ICD-10-CM | POA: Diagnosis not present

## 2014-05-11 NOTE — Progress Notes (Signed)
Weekly Management Note:  Site: Prostate Current Dose:  3900  cGy Projected Dose: 7800  cGy  Narrative: The patient is seen today for routine under treatment assessment. CBCT/MVCT images/port films were reviewed. The chart was reviewed.   Bladder filling is satisfactory.  No significant GU or GI difficulties.  Physical Examination:  Filed Vitals:   05/11/14 0911  BP: 153/73  Pulse: 59  Temp: 97.2 F (36.2 C)  Resp: 16  .  Weight: 215 lb 14.4 oz (97.932 kg).  No change.  Impression: Tolerating radiation therapy well.  Plan: Continue radiation therapy as planned.

## 2014-05-11 NOTE — Progress Notes (Signed)
Weekly rad txs prostate 20/40 completed,  Strong stream , nocturia x2, regular bowel movements, no dysuria,no hematuria, , appetite good, drinks plenty water, energy level good, no pain 9:14 AM

## 2014-05-12 ENCOUNTER — Ambulatory Visit
Admission: RE | Admit: 2014-05-12 | Discharge: 2014-05-12 | Disposition: A | Discharge status: Home / Self Care | Payer: Commercial Managed Care - HMO | Source: Ambulatory Visit | Attending: Radiation Oncology | Admitting: Radiation Oncology

## 2014-05-12 ENCOUNTER — Ambulatory Visit: Payer: Medicare HMO | Admitting: Radiation Oncology

## 2014-05-12 DIAGNOSIS — Z510 Encounter for antineoplastic radiation therapy: Secondary | ICD-10-CM | POA: Diagnosis not present

## 2014-05-13 ENCOUNTER — Ambulatory Visit
Admission: RE | Admit: 2014-05-13 | Discharge: 2014-05-13 | Disposition: A | Discharge status: Home / Self Care | Payer: Commercial Managed Care - HMO | Source: Ambulatory Visit | Attending: Radiation Oncology | Admitting: Radiation Oncology

## 2014-05-13 DIAGNOSIS — Z510 Encounter for antineoplastic radiation therapy: Secondary | ICD-10-CM | POA: Diagnosis not present

## 2014-05-14 ENCOUNTER — Ambulatory Visit
Admission: RE | Admit: 2014-05-14 | Discharge: 2014-05-14 | Disposition: A | Discharge status: Home / Self Care | Payer: Commercial Managed Care - HMO | Source: Ambulatory Visit | Attending: Radiation Oncology | Admitting: Radiation Oncology

## 2014-05-14 DIAGNOSIS — Z510 Encounter for antineoplastic radiation therapy: Secondary | ICD-10-CM | POA: Diagnosis not present

## 2014-05-19 ENCOUNTER — Ambulatory Visit
Admission: RE | Admit: 2014-05-19 | Discharge: 2014-05-19 | Disposition: A | Discharge status: Home / Self Care | Payer: Commercial Managed Care - HMO | Source: Ambulatory Visit | Attending: Radiation Oncology | Admitting: Radiation Oncology

## 2014-05-19 ENCOUNTER — Encounter: Payer: Self-pay | Admitting: Radiation Oncology

## 2014-05-19 ENCOUNTER — Ambulatory Visit
Admission: RE | Admit: 2014-05-19 | Discharge: 2014-05-19 | Disposition: A | Discharge status: Home / Self Care | Payer: Medicare HMO | Source: Ambulatory Visit | Attending: Radiation Oncology | Admitting: Radiation Oncology

## 2014-05-19 VITALS — BP 142/83 | HR 65 | Temp 98.3°F | Resp 20 | Ht 67.0 in | Wt 219.0 lb

## 2014-05-19 DIAGNOSIS — Z510 Encounter for antineoplastic radiation therapy: Secondary | ICD-10-CM | POA: Diagnosis not present

## 2014-05-19 DIAGNOSIS — C61 Malignant neoplasm of prostate: Secondary | ICD-10-CM

## 2014-05-19 NOTE — Progress Notes (Signed)
John Fry has competed 24 fractions to his prostate.  He reports his urinary frequency has improved after starting Flomax on Saturday.  He reports getting up 2 times last night to urinate.  He reports seeing blood in his urine last week.  He reports burning with urination yesterday.  He denies diarrhea and fatigue.

## 2014-05-19 NOTE — Progress Notes (Signed)
Weekly Management Note:  Site: Prostate Current Dose:  4680  cGy Projected Dose: 7800  cGy  Narrative: The patient is seen today for routine under treatment assessment. CBCT/MVCT images/port films were reviewed. The chart was reviewed.   Bladder filling is satisfactory.  No new GU or GI difficulties although he believes that he saw blood on urination once last week.Marland Kitchen  His urination is improved on Flomax.  Physical Examination:  Filed Vitals:   05/19/14 1014  BP: 142/83  Pulse: 65  Temp: 98.3 F (36.8 C)  Resp: 20  .  Weight: 219 lb (99.338 kg).  No change.  Impression: Tolerating radiation therapy well.  Plan: Continue radiation therapy as planned.

## 2014-05-20 ENCOUNTER — Ambulatory Visit
Admission: RE | Admit: 2014-05-20 | Discharge: 2014-05-20 | Disposition: A | Discharge status: Home / Self Care | Payer: Commercial Managed Care - HMO | Source: Ambulatory Visit | Attending: Radiation Oncology | Admitting: Radiation Oncology

## 2014-05-20 DIAGNOSIS — Z510 Encounter for antineoplastic radiation therapy: Secondary | ICD-10-CM | POA: Diagnosis not present

## 2014-05-21 ENCOUNTER — Ambulatory Visit
Admission: RE | Admit: 2014-05-21 | Discharge: 2014-05-21 | Disposition: A | Discharge status: Home / Self Care | Payer: Commercial Managed Care - HMO | Source: Ambulatory Visit | Attending: Radiation Oncology | Admitting: Radiation Oncology

## 2014-05-21 DIAGNOSIS — Z510 Encounter for antineoplastic radiation therapy: Secondary | ICD-10-CM | POA: Diagnosis not present

## 2014-05-22 ENCOUNTER — Ambulatory Visit
Admission: RE | Admit: 2014-05-22 | Discharge: 2014-05-22 | Disposition: A | Discharge status: Home / Self Care | Payer: Commercial Managed Care - HMO | Source: Ambulatory Visit | Attending: Radiation Oncology | Admitting: Radiation Oncology

## 2014-05-22 DIAGNOSIS — Z510 Encounter for antineoplastic radiation therapy: Secondary | ICD-10-CM | POA: Diagnosis not present

## 2014-05-23 ENCOUNTER — Ambulatory Visit: Payer: Commercial Managed Care - HMO

## 2014-05-26 ENCOUNTER — Ambulatory Visit
Admission: RE | Admit: 2014-05-26 | Discharge: 2014-05-26 | Disposition: A | Discharge status: Home / Self Care | Payer: Commercial Managed Care - HMO | Source: Ambulatory Visit | Attending: Radiation Oncology | Admitting: Radiation Oncology

## 2014-05-26 ENCOUNTER — Encounter: Payer: Self-pay | Admitting: Radiation Oncology

## 2014-05-26 ENCOUNTER — Ambulatory Visit
Admission: RE | Admit: 2014-05-26 | Discharge: 2014-05-26 | Disposition: A | Discharge status: Home / Self Care | Payer: Medicare HMO | Source: Ambulatory Visit | Attending: Radiation Oncology | Admitting: Radiation Oncology

## 2014-05-26 VITALS — BP 155/91 | HR 69 | Temp 97.8°F | Resp 20 | Wt 219.5 lb

## 2014-05-26 DIAGNOSIS — Z510 Encounter for antineoplastic radiation therapy: Secondary | ICD-10-CM | POA: Diagnosis not present

## 2014-05-26 DIAGNOSIS — C61 Malignant neoplasm of prostate: Secondary | ICD-10-CM

## 2014-05-26 NOTE — Progress Notes (Signed)
Patient denies pain, fatigue, bowel issues, loss of appetite. He states he noticed "a little blood in his urine once last Wed and Thursday but none since". He denies other urinary issues.

## 2014-05-26 NOTE — Progress Notes (Signed)
Weekly Management Note:  Site: Prostate Current Dose:  5460  cGy Projected Dose: 7800  cGy  Narrative: The patient is seen today for routine under treatment assessment. CBCT/MVCT images/port films were reviewed. The chart was reviewed.   Bladder filling is satisfactory.  No GU or GI difficulties except for 2 episodes of blood in his urine last week.  He has not had any hematuria since last Thursday.  Denies dysuria, fever, or chills.  No history of kidney stones.  He does see Dr. Tresa Moore earlier this week.  Physical Examination:  Filed Vitals:   05/26/14 0918  BP: 155/91  Pulse: 69  Temp: 97.8 F (36.6 C)  Resp: 20  .  Weight: 219 lb 8 oz (99.565 kg).  No change.  Impression: Tolerating radiation therapy well.  Plan: Continue radiation therapy as planned.

## 2014-05-27 ENCOUNTER — Ambulatory Visit
Admission: RE | Admit: 2014-05-27 | Discharge: 2014-05-27 | Disposition: A | Discharge status: Home / Self Care | Payer: Commercial Managed Care - HMO | Source: Ambulatory Visit | Attending: Radiation Oncology | Admitting: Radiation Oncology

## 2014-05-27 DIAGNOSIS — Z510 Encounter for antineoplastic radiation therapy: Secondary | ICD-10-CM | POA: Diagnosis not present

## 2014-05-28 ENCOUNTER — Ambulatory Visit
Admission: RE | Admit: 2014-05-28 | Discharge: 2014-05-28 | Disposition: A | Discharge status: Home / Self Care | Payer: Commercial Managed Care - HMO | Source: Ambulatory Visit | Attending: Radiation Oncology | Admitting: Radiation Oncology

## 2014-05-28 DIAGNOSIS — Z510 Encounter for antineoplastic radiation therapy: Secondary | ICD-10-CM | POA: Diagnosis not present

## 2014-05-29 ENCOUNTER — Ambulatory Visit
Admission: RE | Admit: 2014-05-29 | Discharge: 2014-05-29 | Disposition: A | Discharge status: Home / Self Care | Payer: Commercial Managed Care - HMO | Source: Ambulatory Visit | Attending: Radiation Oncology | Admitting: Radiation Oncology

## 2014-05-29 DIAGNOSIS — Z510 Encounter for antineoplastic radiation therapy: Secondary | ICD-10-CM | POA: Diagnosis not present

## 2014-05-30 ENCOUNTER — Ambulatory Visit
Admission: RE | Admit: 2014-05-30 | Discharge: 2014-05-30 | Disposition: A | Discharge status: Home / Self Care | Payer: Commercial Managed Care - HMO | Source: Ambulatory Visit | Attending: Radiation Oncology | Admitting: Radiation Oncology

## 2014-05-30 DIAGNOSIS — Z510 Encounter for antineoplastic radiation therapy: Secondary | ICD-10-CM | POA: Diagnosis not present

## 2014-06-02 ENCOUNTER — Ambulatory Visit
Admission: RE | Admit: 2014-06-02 | Discharge: 2014-06-02 | Disposition: A | Discharge status: Home / Self Care | Payer: Medicare HMO | Source: Ambulatory Visit | Attending: Radiation Oncology | Admitting: Radiation Oncology

## 2014-06-02 ENCOUNTER — Encounter: Payer: Self-pay | Admitting: Radiation Oncology

## 2014-06-02 ENCOUNTER — Ambulatory Visit
Admission: RE | Admit: 2014-06-02 | Discharge: 2014-06-02 | Disposition: A | Discharge status: Home / Self Care | Payer: Commercial Managed Care - HMO | Source: Ambulatory Visit | Attending: Radiation Oncology | Admitting: Radiation Oncology

## 2014-06-02 VITALS — BP 160/88 | HR 64 | Temp 97.7°F | Resp 20 | Wt 216.7 lb

## 2014-06-02 DIAGNOSIS — C61 Malignant neoplasm of prostate: Secondary | ICD-10-CM

## 2014-06-02 DIAGNOSIS — Z510 Encounter for antineoplastic radiation therapy: Secondary | ICD-10-CM | POA: Diagnosis not present

## 2014-06-02 NOTE — Progress Notes (Signed)
Patient denies pain, fatigue, loss of appetite, bowel issues. He states he has not had any more episodes of blood in his urine.

## 2014-06-02 NOTE — Progress Notes (Signed)
Weekly Management Note:  Site: Prostate Current Dose:  6435  cGy Projected Dose: 7800  cGy  Narrative: The patient is seen today for routine under treatment assessment. CBCT/MVCT images/port films were reviewed. The chart was reviewed.   Bladder filling remains excellent.  No new GU or GI difficulty.  Physical Examination:  Filed Vitals:   06/02/14 1007  BP: 160/88  Pulse: 64  Temp: 97.7 F (36.5 C)  Resp: 20  .  Weight: 216 lb 11.2 oz (98.294 kg).  No change.  Impression: Tolerating radiation therapy well.  Plan: Continue radiation therapy as planned.

## 2014-06-03 ENCOUNTER — Ambulatory Visit
Admission: RE | Admit: 2014-06-03 | Discharge: 2014-06-03 | Disposition: A | Discharge status: Home / Self Care | Payer: Commercial Managed Care - HMO | Source: Ambulatory Visit | Attending: Radiation Oncology | Admitting: Radiation Oncology

## 2014-06-03 DIAGNOSIS — Z510 Encounter for antineoplastic radiation therapy: Secondary | ICD-10-CM | POA: Diagnosis not present

## 2014-06-04 ENCOUNTER — Ambulatory Visit
Admission: RE | Admit: 2014-06-04 | Discharge: 2014-06-04 | Disposition: A | Discharge status: Home / Self Care | Payer: Commercial Managed Care - HMO | Source: Ambulatory Visit | Attending: Radiation Oncology | Admitting: Radiation Oncology

## 2014-06-04 DIAGNOSIS — Z510 Encounter for antineoplastic radiation therapy: Secondary | ICD-10-CM | POA: Diagnosis not present

## 2014-06-05 ENCOUNTER — Ambulatory Visit
Admission: RE | Admit: 2014-06-05 | Discharge: 2014-06-05 | Disposition: A | Discharge status: Home / Self Care | Payer: Commercial Managed Care - HMO | Source: Ambulatory Visit | Attending: Radiation Oncology | Admitting: Radiation Oncology

## 2014-06-05 DIAGNOSIS — Z510 Encounter for antineoplastic radiation therapy: Secondary | ICD-10-CM | POA: Diagnosis not present

## 2014-06-06 ENCOUNTER — Telehealth: Payer: Self-pay

## 2014-06-06 ENCOUNTER — Ambulatory Visit
Admission: RE | Admit: 2014-06-06 | Discharge: 2014-06-06 | Disposition: A | Discharge status: Home / Self Care | Payer: Commercial Managed Care - HMO | Source: Ambulatory Visit | Attending: Radiation Oncology | Admitting: Radiation Oncology

## 2014-06-06 DIAGNOSIS — Z510 Encounter for antineoplastic radiation therapy: Secondary | ICD-10-CM | POA: Diagnosis not present

## 2014-06-06 NOTE — Telephone Encounter (Signed)
Unable to reach pt to discuss flu shot. Phone would not connect.

## 2014-06-09 ENCOUNTER — Ambulatory Visit
Admission: RE | Admit: 2014-06-09 | Discharge: 2014-06-09 | Disposition: A | Discharge status: Home / Self Care | Payer: Commercial Managed Care - HMO | Source: Ambulatory Visit | Attending: Radiation Oncology | Admitting: Radiation Oncology

## 2014-06-09 VITALS — BP 137/77 | HR 67 | Temp 98.8°F | Resp 18 | Wt 217.5 lb

## 2014-06-09 DIAGNOSIS — Z510 Encounter for antineoplastic radiation therapy: Secondary | ICD-10-CM | POA: Diagnosis not present

## 2014-06-09 DIAGNOSIS — C61 Malignant neoplasm of prostate: Secondary | ICD-10-CM

## 2014-06-09 NOTE — Progress Notes (Signed)
Patient reports 1 week of increased urinary frequency, nocturia x 5-6. He takes Flomax every morning; advised he take at night instead and stop drinking fluids 2 hrs prior to going to bed. He denies other urinary issues, denies bowel issues, fatigue, loss of appetite. Pt will complete on Wed, gave him 1 month Fu card.

## 2014-06-09 NOTE — Progress Notes (Signed)
Weekly Management Note:  Site: Prostate Current Dose:  7410  cGy Projected Dose: 7800  cGy  Narrative: The patient is seen today for routine under treatment assessment. CBCT/MVCT images/port films were reviewed. The chart was reviewed.  Nocturia x 5-6. See nursing note on instructions to manage.  Physical Examination:  Filed Vitals:   06/09/14 0942  BP: 137/77  Pulse: 67  Temp: 98.8 F (37.1 C)  Resp: 18  .  Weight: 217 lb 8 oz (98.657 kg).  NAD  Impression: Tolerating radiation therapy well.  Plan: Continue radiation therapy as planned. Card given to f/u in 86mo.  -----------------------------------  Eppie Gibson, MD

## 2014-06-10 ENCOUNTER — Ambulatory Visit: Payer: Commercial Managed Care - HMO

## 2014-06-10 ENCOUNTER — Ambulatory Visit
Admission: RE | Admit: 2014-06-10 | Discharge: 2014-06-10 | Disposition: A | Discharge status: Home / Self Care | Payer: Commercial Managed Care - HMO | Source: Ambulatory Visit | Attending: Radiation Oncology | Admitting: Radiation Oncology

## 2014-06-10 DIAGNOSIS — Z510 Encounter for antineoplastic radiation therapy: Secondary | ICD-10-CM | POA: Diagnosis not present

## 2014-06-11 ENCOUNTER — Ambulatory Visit: Payer: Commercial Managed Care - HMO

## 2014-06-11 ENCOUNTER — Ambulatory Visit
Admission: RE | Admit: 2014-06-11 | Discharge: 2014-06-11 | Disposition: A | Discharge status: Home / Self Care | Payer: Commercial Managed Care - HMO | Source: Ambulatory Visit | Attending: Radiation Oncology | Admitting: Radiation Oncology

## 2014-06-11 DIAGNOSIS — Z510 Encounter for antineoplastic radiation therapy: Secondary | ICD-10-CM | POA: Diagnosis not present

## 2014-06-12 ENCOUNTER — Ambulatory Visit: Payer: Commercial Managed Care - HMO

## 2014-06-14 ENCOUNTER — Encounter: Payer: Self-pay | Admitting: Radiation Oncology

## 2014-06-14 NOTE — Progress Notes (Signed)
[facility] End of Treatment Note  Name:John Fry  Date: 06/14/2014 SD:[id number] DOB:[birth year]   Status:outpatient    CC: Renato Shin, MD  Dr. Phebe Colla  REFERRING PHYSICIAN:   Dr. 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  This worked well until his final week of radiation therapy when he developed worsening nocturia.  He was advised to cut back on his fluid intake in the evening.  Of note is that he had one or 2 episodes of gross hematuria during his course of therapy.                           PLAN: Routine followup in one month. Patient instructed to call if questions or worsening complaints in interim.

## 2014-06-23 ENCOUNTER — Encounter: Payer: Commercial Managed Care - HMO | Admitting: Endocrinology

## 2014-06-23 DIAGNOSIS — R350 Frequency of micturition: Secondary | ICD-10-CM | POA: Diagnosis not present

## 2014-06-23 DIAGNOSIS — C61 Malignant neoplasm of prostate: Secondary | ICD-10-CM | POA: Diagnosis not present

## 2014-06-23 DIAGNOSIS — N189 Chronic kidney disease, unspecified: Secondary | ICD-10-CM | POA: Diagnosis not present

## 2014-06-23 DIAGNOSIS — N281 Cyst of kidney, acquired: Secondary | ICD-10-CM | POA: Diagnosis not present

## 2014-07-03 ENCOUNTER — Encounter: Payer: Commercial Managed Care - HMO | Admitting: Endocrinology

## 2014-07-07 ENCOUNTER — Encounter: Payer: Self-pay | Admitting: Radiation Oncology

## 2014-07-08 ENCOUNTER — Ambulatory Visit
Admission: RE | Admit: 2014-07-08 | Discharge: 2014-07-08 | Disposition: A | Discharge status: Home / Self Care | Payer: Commercial Managed Care - HMO | Source: Ambulatory Visit | Attending: Radiation Oncology | Admitting: Radiation Oncology

## 2014-07-08 ENCOUNTER — Encounter: Payer: Self-pay | Admitting: Radiation Oncology

## 2014-07-08 VITALS — BP 156/69 | HR 77 | Temp 98.2°F | Resp 20 | Ht 67.0 in | Wt 217.4 lb

## 2014-07-08 DIAGNOSIS — C61 Malignant neoplasm of prostate: Secondary | ICD-10-CM

## 2014-07-08 HISTORY — DX: Personal history of irradiation: Z92.3

## 2014-07-08 NOTE — Progress Notes (Signed)
CC: Dr. Phebe Colla  Follow-up note:  [name] Fry visits today approximately 1 month following completion of therapy in the management of his stage TIc intermediate risk adenocarcinoma prostate.  He resumed his Flomax after worsening nocturia.  He now has nocturia 2.  He's had no further hematuria.  No GI difficulties.  He saw Dr. Tresa Moore on January 4 and he will see him back for a follow-up visit and PSA determination in approximately 3 months.  Physical examination: Alert and oriented. Filed Vitals:   07/08/14 1556  BP: 156/69  Pulse: 77  Temp: 98.2 F (36.8 C)  Resp: 20   Rectal examination is not performed today.  Impression: Satisfactory progress.  Plan: Follow-up through Dr. Tresa Moore.  I've not scheduled the patient for a formal follow-up visit and I ask that Dr. Tresa Moore keep me posted on his progress.

## 2014-07-08 NOTE — Progress Notes (Signed)
Follow up prostate rad xs 04/14/14-06/11/14, no dysuria, hematuria, , restarted flomax daily, up 2x night to void, good stream, emptys bladder mostly stated, no nausea, regular bowel movements daily, no fatigue, good appetite, saw dR. mANNY 06/23/14 ,GOOD REPORT F/U 3 MONTHS,  3:59 PM

## 2014-07-10 ENCOUNTER — Ambulatory Visit (INDEPENDENT_AMBULATORY_CARE_PROVIDER_SITE_OTHER): Payer: Commercial Managed Care - HMO | Admitting: Endocrinology

## 2014-07-10 ENCOUNTER — Encounter: Payer: Self-pay | Admitting: Endocrinology

## 2014-07-10 VITALS — BP 130/94 | HR 67 | Temp 97.1°F | Ht 67.0 in | Wt 218.0 lb

## 2014-07-10 DIAGNOSIS — Z0000 Encounter for general adult medical examination without abnormal findings: Secondary | ICD-10-CM

## 2014-07-10 DIAGNOSIS — N189 Chronic kidney disease, unspecified: Secondary | ICD-10-CM | POA: Diagnosis not present

## 2014-07-10 DIAGNOSIS — Z23 Encounter for immunization: Secondary | ICD-10-CM

## 2014-07-10 DIAGNOSIS — E1022 Type 1 diabetes mellitus with diabetic chronic kidney disease: Secondary | ICD-10-CM

## 2014-07-10 LAB — BASIC METABOLIC PANEL
BUN: 19 mg/dL (ref 6–23)
CO2: 28 mEq/L (ref 19–32)
CREATININE: 1.69 mg/dL — AB (ref 0.40–1.50)
Calcium: 9.6 mg/dL (ref 8.4–10.5)
Chloride: 107 mEq/L (ref 96–112)
GFR: 51.25 mL/min — ABNORMAL LOW (ref >60.00)
Glucose, Bld: 154 mg/dL — ABNORMAL HIGH (ref 70–99)
POTASSIUM: 4.2 meq/L (ref 3.5–5.1)
Sodium: 140 mEq/L (ref 135–145)

## 2014-07-10 LAB — HEMOGLOBIN A1C: HEMOGLOBIN A1C: 8.4 % — AB (ref 4.6–6.5)

## 2014-07-10 NOTE — Progress Notes (Signed)
we discussed code status.  pt requests full code, but would not want to be started or maintained on artificial life-support measures if there was not a reasonable chance of recovery 

## 2014-07-10 NOTE — Progress Notes (Signed)
Subjective:    Patient ID: John Fry, male    DOB: 1941-02-14, 74 y.o.   MRN: [id number]  HPI Pt is here for regular wellness examination, and is feeling pretty well in general, and says chronic med probs are stable, except as noted below Past Medical History  Diagnosis Date  . RENAL INSUFFICIENCY 05/22/2008  . SHOULDER PAIN, RIGHT 05/08/2008  . VENTRICULAR HYPERTROPHY, LEFT 05/08/2008  . DYSLIPIDEMIA 03/05/2007  . HERNIA 03/05/2007  . HYPERTENSION 03/05/2007  . 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 Diabetes mellitus without complication   . S/P radiation therapy 04/14/2014 through 06/11/2014                                                       Prostate 7800 cGy in 40 sessions, seminal vesicles 5600 cGy in 40 sessions                          Past Surgical History  Procedure Laterality Date  . Foot surgery  1999    Right Foot Bunion Repair  . Lower arterial  05/06/2003  . Prostate biopsy  07/23/13    gleason 7, volume 53 ml  . Remove fatty tumor Left 1995    thigh    History   Social History  . Marital Status: Married    Spouse Name: N/A    Number of Children: N/A  . Years of Education: N/A   Occupational History  .      Retired   Social History Main Topics  . Smoking status: Former Smoker -- 0.50 packs/day for 20 years    Quit date: 06/20/2006  . Smokeless tobacco: Not on file  . Alcohol Use: No  . Drug Use: No  . Sexual Activity: Not on file   Other Topics Concern  . Not on file   Social History Narrative    Current Outpatient Prescriptions on File Prior to Visit  Medication Sig Dispense Refill  . amLODipine (NORVASC) 10 MG tablet Take 1 tablet (10 mg total) by mouth daily. 90 tablet 3  . atorvastatin (LIPITOR) 40 MG tablet Take 1 tablet (40 mg total) by mouth daily. 90 tablet 3  . bd getting started take home kit MISC 1 kit by Other route once. 1 kit 0  . glucose blood (ACCU-CHEK AVIVA) test strip 1 each by Other route 2 (two)  times daily. And lancets 2/day 250.01 100 each 12  . hydrochlorothiazide (HYDRODIURIL) 12.5 MG tablet Take 1 tablet (12.5 mg total) by mouth daily. 90 tablet 3  . insulin NPH Human (HUMULIN N) 100 UNIT/ML injection Inject 0.3 mLs (30 Units total) into the skin at bedtime. 10 mL 11  . insulin regular (NOVOLIN R,HUMULIN R) 100 units/mL injection 3 times a day (just before each meal) 15-10-15 units (Patient taking differently: 3 times a day (just before each meal) 15-15-25 units) 20 mL 11  . Insulin Syringe-Needle U-100 (INSULIN SYRINGE .5CC/31GX5/16") 31G X 5/16" 0.5 ML MISC 1 Device by Does not apply route 4 (four) times daily. 120 each 11  . simvastatin (ZOCOR) 80 MG tablet     . tamsulosin (FLOMAX) 0.4 MG CAPS capsule Take 1 capsule (0.4 mg total) by mouth daily. 30 capsule 3   No current  facility-administered medications on file prior to visit.    Allergies  Allergen Reactions  . Zestril [Lisinopril]     angioedema    Family History  Problem Relation Age of Onset  . Hypertension Neg Hx   . Cancer Father 41    prostate  . Kidney Stones Brother   . Diabetes Sister     BP 130/94 mmHg  Pulse 67  Temp(Src) 97.1 F (36.2 C) (Oral)  Ht 5' 7"  (1.702 m)  Wt 218 lb (98.884 kg)  BMI 34.14 kg/m2  SpO2 98%     Review of Systems  Constitutional: Negative for fever.  HENT: Negative for nosebleeds.   Eyes: Negative for pain.  Respiratory: Negative for shortness of breath.   Cardiovascular: Negative for chest pain.  Gastrointestinal: Negative for anal bleeding.  Endocrine: Negative for cold intolerance.  Genitourinary: Negative for hematuria.  Musculoskeletal: Negative for back pain.  Skin: Negative for rash.  Allergic/Immunologic: Positive for environmental allergies.  Neurological: Negative for syncope.  Hematological: Does not bruise/bleed easily.  Psychiatric/Behavioral: Negative for sleep disturbance.       Objective:   Physical Exam VS: see vs page GEN: no  distress HEAD: head: no deformity eyes: no periorbital swelling, no proptosis external nose and ears are normal mouth: no lesion seen NECK: supple, thyroid is not enlarged CHEST WALL: no deformity LUNGS: clear to auscultation BREASTS:  No gynecomastia CV: reg rate and rhythm, no murmur ABD: abdomen is soft, nontender.  no hepatosplenomegaly.  not distended.  Large but self-reducing ventra hernia  RECTAL/PROSTATE: sees urology. MUSCULOSKELETAL: muscle bulk and strength are grossly normal.  no obvious joint swelling.  gait is normal and steady PULSES: no carotid bruit NEURO:  cn 2-12 grossly intact.   readily moves all 4's.  SKIN:  Normal texture and temperature.  No rash or suspicious lesion is visible.   NODES:  None palpable at the neck PSYCH: alert, well-oriented.  Does not appear anxious nor depressed.         Assessment & Plan:  Wellness visit today, with problems stable, except as noted.   SEPARATE EVALUATION FOLLOWS--EACH PROBLEM HERE IS NEW, NOT RESPONDING TO TREATMENT, OR POSES SIGNIFICANT RISK TO THE PATIENT'S HEALTH: HISTORY OF THE PRESENT ILLNESS: Pt returns for f/u of diabetes mellitus: DM type: 1 Dx'ed: 7989 Complications: none Therapy: insulin since dx DKA: only at dx Severe hypoglycemia: never Pancreatitis: never Other:  he takes human insulin, due to cost; he takes multiple daily injections Interval history: no cbg record, but states cbg's are well-controlled.  He says it is highest in am.  PAST MEDICAL HISTORY reviewed and up to date today REVIEW OF SYSTEMS: He denies hypoglycemia and weight change.  PHYSICAL EXAMINATION: VITAL SIGNS:  See vs page GENERAL: no distress Pulses: dorsalis pedis intact bilat.   MSK: no deformity of the feet, except for a few overlapping toes CV: no leg edema.  Skin:  no ulcer on the feet.  normal color and temp on the feet. Neuro: sensation is intact to touch on the feet.  Ext: There is bilateral onychomycosis of the  toenails.  LAB/XRAY RESULTS: Lab Results  Component Value Date   HGBA1C 8.4* 07/10/2014   Lab Results  Component Value Date   CREATININE 1.69* 07/10/2014   BUN 19 07/10/2014   NA 140 07/10/2014   K 4.2 07/10/2014   CL 107 07/10/2014   CO2 28 07/10/2014  IMPRESSION: DM: moderate exacerbation Renal failure, stable PLAN:  We'll follow creat increase regular (clear)  insulin to 3 times a day (just before each meal) 15-15-25 units   Subjective:   Patient here for Medicare annual wellness visit and management of other chronic and acute problems.     Risk factors: advanced age    29 of Physicians Providing Medical Care to Patient:  See "snapshot"   Activities of Daily Living: In your present state of health, do you have any difficulty performing the following activities?:  Preparing food and eating?: No  Bathing yourself: No  Getting dressed: No  Using the toilet:No  Moving around from place to place: No  In the past year have you fallen or had a near fall?:No    Home Safety: Has smoke detector and wears seat belts. No firearms.    Diet and Exercise  Current exercise habits: pt says good Dietary issues discussed: pt reports a healthy diet   Depression Screen  Q1: Over the past two weeks, have you felt down, depressed or hopeless? no  Q2: Over the past two weeks, have you felt little interest or pleasure in doing things? no   The following portions of the patient's history were reviewed and updated as appropriate: allergies, current medications, past family history, past medical history, past social history, past surgical history and problem list.   Review of Systems  Denies hearing loss, and visual loss Objective:   Vision:  Sees opthalmologist Hearing: grossly normal Body mass index:  See vs page Msk: pt easily and quickly performs "get-up-and-go" from a sitting position Cognitive Impairment Assessment: cognition, memory and judgment appear normal.  remembers  3/3 at 5 minutes.  excellent recall.  can easily read and write a sentence.  alert and oriented x 3.   Assessment:   Medicare wellness utd on preventive parameters    Plan:   During the course of the visit the patient was educated and counseled about appropriate screening and preventive services including:       Fall prevention   Screening mammography  Bone densitometry screening  Diabetes screening  Nutrition counseling   Vaccines / LABS Zostavax / Pneumococcal Vaccine  today  PSA  Patient Instructions (the written plan) was given to the patient.

## 2014-07-10 NOTE — Patient Instructions (Addendum)
blood tests are being requested for you today.  We'll let you know about the results. please consider these measures for your health:  minimize alcohol.  do not use tobacco products.  have a colonoscopy at least every 10 years from age 75.  keep firearms safely stored.  always use seat belts.  have working smoke alarms in your home.  see an eye doctor and dentist regularly.  never drive under the influence of alcohol or drugs (including prescription drugs).   it is critically important to prevent falling down (keep floor areas well-lit, dry, and free of loose objects.  If you have a cane, walker, or wheelchair, you should use it, even for short trips around the house.  Also, try not to rush).   Please come back for a follow-up appointment in 3 months.

## 2014-07-31 ENCOUNTER — Encounter: Payer: Self-pay | Admitting: Internal Medicine

## 2014-09-11 ENCOUNTER — Ambulatory Visit (AMBULATORY_SURGERY_CENTER): Payer: Self-pay

## 2014-09-11 VITALS — Ht 67.0 in | Wt 215.0 lb

## 2014-09-11 DIAGNOSIS — Z860100 Personal history of colon polyps, unspecified: Secondary | ICD-10-CM

## 2014-09-11 MED ORDER — MOVIPREP 100 G PO SOLR: 1.0000 | Freq: Once | ORAL | Status: DC

## 2014-09-11 NOTE — Progress Notes (Signed)
No allergies to eggs or soy No diet/weight loss meds No home oxygen No past problems with anesthesia  No email 

## 2014-09-18 ENCOUNTER — Telehealth: Payer: Self-pay | Admitting: Endocrinology

## 2014-09-18 NOTE — Telephone Encounter (Signed)
error 

## 2014-09-22 ENCOUNTER — Telehealth: Payer: Self-pay | Admitting: Internal Medicine

## 2014-09-23 NOTE — Telephone Encounter (Signed)
No

## 2014-09-24 ENCOUNTER — Encounter: Payer: Commercial Managed Care - HMO | Admitting: Internal Medicine

## 2014-10-06 ENCOUNTER — Telehealth: Payer: Self-pay | Admitting: Endocrinology

## 2014-10-06 DIAGNOSIS — C61 Malignant neoplasm of prostate: Secondary | ICD-10-CM

## 2014-10-06 NOTE — Telephone Encounter (Signed)
Patient stated that he need a referral sent to Dr Tresa Moore Office he has an appointment friday

## 2014-10-06 NOTE — Telephone Encounter (Signed)
Was unable to contact the pt. Will try again at a later time.

## 2014-10-07 NOTE — Telephone Encounter (Signed)
done

## 2014-10-07 NOTE — Telephone Encounter (Signed)
See note below   Thanks

## 2014-10-09 ENCOUNTER — Ambulatory Visit (INDEPENDENT_AMBULATORY_CARE_PROVIDER_SITE_OTHER): Payer: Commercial Managed Care - HMO | Admitting: Endocrinology

## 2014-10-09 ENCOUNTER — Encounter: Payer: Self-pay | Admitting: Endocrinology

## 2014-10-09 ENCOUNTER — Telehealth: Payer: Self-pay | Admitting: Endocrinology

## 2014-10-09 VITALS — BP 136/98 | HR 63 | Temp 98.3°F | Ht 67.0 in | Wt 219.0 lb

## 2014-10-09 DIAGNOSIS — N189 Chronic kidney disease, unspecified: Secondary | ICD-10-CM | POA: Diagnosis not present

## 2014-10-09 DIAGNOSIS — E213 Hyperparathyroidism, unspecified: Secondary | ICD-10-CM | POA: Diagnosis not present

## 2014-10-09 DIAGNOSIS — N259 Disorder resulting from impaired renal tubular function, unspecified: Secondary | ICD-10-CM | POA: Diagnosis not present

## 2014-10-09 DIAGNOSIS — E1022 Type 1 diabetes mellitus with diabetic chronic kidney disease: Secondary | ICD-10-CM

## 2014-10-09 LAB — BASIC METABOLIC PANEL
BUN: 15 mg/dL (ref 6–23)
CO2: 24 mEq/L (ref 19–32)
Calcium: 9.8 mg/dL (ref 8.4–10.5)
Chloride: 108 mEq/L (ref 96–112)
Creatinine, Ser: 1.61 mg/dL — ABNORMAL HIGH (ref 0.40–1.50)
GFR: 54.16 mL/min — ABNORMAL LOW (ref >60.00)
Glucose, Bld: 107 mg/dL — ABNORMAL HIGH (ref 70–99)
POTASSIUM: 3.8 meq/L (ref 3.5–5.1)
Sodium: 138 mEq/L (ref 135–145)

## 2014-10-09 LAB — VITAMIN D 25 HYDROXY (VIT D DEFICIENCY, FRACTURES): VITD: 19.92 ng/mL — AB (ref 30.00–100.00)

## 2014-10-09 LAB — HEMOGLOBIN A1C: Hgb A1c MFr Bld: 8.2 % — ABNORMAL HIGH (ref 4.6–6.5)

## 2014-10-09 MED ORDER — INSULIN REGULAR HUMAN 100 UNIT/ML IJ SOLN: INTRAMUSCULAR | Status: DC

## 2014-10-09 MED ORDER — VITAMIN D (ERGOCALCIFEROL) 1.25 MG (50000 UNIT) PO CAPS: 50000.0000 [IU] | ORAL_CAPSULE | ORAL | Status: DC

## 2014-10-09 NOTE — Progress Notes (Signed)
Subjective:    Patient ID: John Fry, male    DOB: 01-12-1941, 74 y.o.   MRN: [id number]  HPI Pt returns for f/u of diabetes mellitus: DM type: 1 Dx'ed: 4132 Complications: none Therapy: insulin since dx DKA: only at dx Severe hypoglycemia: never Pancreatitis: never Other:  he takes human insulin, due to cost; he takes multiple daily injections Interval history: no cbg record, but states cbg's are sometimes low in the afternoon.  Otherwise, There is no trend throughout the day.  pt states he feels well in general. Past Medical History  Diagnosis Date  . RENAL INSUFFICIENCY 05/22/2008  . SHOULDER PAIN, RIGHT 05/08/2008  . VENTRICULAR HYPERTROPHY, LEFT 05/08/2008  . DYSLIPIDEMIA 03/05/2007  . HERNIA 03/05/2007  . HYPERTENSION 03/05/2007  . 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 Diabetes mellitus without complication   . S/P radiation therapy 04/14/2014 through 06/11/2014                                                       Prostate 7800 cGy in 40 sessions, seminal vesicles 5600 cGy in 40 sessions                          Past Surgical History  Procedure Laterality Date  . Foot surgery  1999    Right Foot Bunion Repair  . Lower arterial  05/06/2003  . Prostate biopsy  07/23/13    gleason 7, volume 53 ml  . Remove fatty tumor Left 1995    thigh    History   Social History  . Marital Status: Married    Spouse Name: N/A  . Number of Children: N/A  . Years of Education: N/A   Occupational History  .      Retired   Social History Main Topics  . Smoking status: Former Smoker -- 0.50 packs/day for 20 years    Quit date: 06/20/2006  . Smokeless tobacco: Never Used  . Alcohol Use: No  . Drug Use: No  . Sexual Activity: Not on file   Other Topics Concern  . Not on file   Social History Narrative    Current Outpatient Prescriptions on File Prior to Visit  Medication Sig Dispense Refill  . amLODipine (NORVASC) 10 MG tablet Take 1 tablet (10  mg total) by mouth daily. 90 tablet 3  . atorvastatin (LIPITOR) 40 MG tablet Take 1 tablet (40 mg total) by mouth daily. 90 tablet 3  . bd getting started take home kit MISC 1 kit by Other route once. 1 kit 0  . glucose blood (ACCU-CHEK AVIVA) test strip 1 each by Other route 2 (two) times daily. And lancets 2/day 250.01 100 each 12  . hydrochlorothiazide (HYDRODIURIL) 12.5 MG tablet Take 1 tablet (12.5 mg total) by mouth daily. 90 tablet 3  . insulin NPH Human (HUMULIN N) 100 UNIT/ML injection Inject 0.3 mLs (30 Units total) into the skin at bedtime. 10 mL 11  . Insulin Syringe-Needle U-100 (INSULIN SYRINGE .5CC/31GX5/16") 31G X 5/16" 0.5 ML MISC 1 Device by Does not apply route 4 (four) times daily. 120 each 11  . MOVIPREP 100 G SOLR Take 1 kit (200 g total) by mouth once. 1 kit 0  . simvastatin (ZOCOR) 80 MG tablet     .  tamsulosin (FLOMAX) 0.4 MG CAPS capsule Take 1 capsule (0.4 mg total) by mouth daily. 30 capsule 3   No current facility-administered medications on file prior to visit.    Allergies  Allergen Reactions  . Zestril [Lisinopril]     angioedema    Family History  Problem Relation Age of Onset  . Hypertension Neg Hx   . Colon cancer Neg Hx   . Cancer Father 66    prostate  . Kidney Stones Brother   . Diabetes Sister     BP 136/98 mmHg  Pulse 63  Temp(Src) 98.3 F (36.8 C) (Oral)  Ht 5' 7"  (1.702 m)  Wt 219 lb (99.338 kg)  BMI 34.29 kg/m2  SpO2 98%  Review of Systems Denies LOC.  He has lost a few lbs.  He has intermittent leg cramps.    Objective:   Physical Exam VITAL SIGNS:  See vs page GENERAL: no distress Pulses: dorsalis pedis intact bilat.   MSK: no deformity of the feet, except for a few overlapping toes.   CV: no leg edema.  Skin:  no ulcer on the feet.  normal color and temp on the feet. Neuro: sensation is intact to touch on the feet.  Ext: There is bilateral onychomycosis of the toenails.    Lab Results  Component Value Date   HGBA1C  8.2* 10/09/2014   Lab Results  Component Value Date   PTH 111* 10/09/2014   CALCIUM 9.8 10/09/2014   CALCIUM 9.5 10/09/2014      Assessment & Plan:  DM: he needs increased rx Vit-D deficiency, new.  i have sent a prescription to your pharmacy, for a supplement. Renal insuff, stable.  We'll follow  Patient is advised the following: Patient Instructions  blood tests are requested for you today.  We'll let you know about the results. Please decrease the breakfast insulin to 10 units. check your blood sugar twice a day.  vary the time of day when you check, between before the 3 meals, and at bedtime.  also check if you have symptoms of your blood sugar being too high or too low.  please keep a record of the readings and bring it to your next appointment here.  You can write it on any piece of paper.  please call us sooner if your blood sugar goes below 70, or if you have a lot of readings over 200. Please come back for a follow-up appointment in 3 months.  addendum: increase the regular insulin to 3 times a day (just before each meal) 04-08-29 units

## 2014-10-09 NOTE — Patient Instructions (Addendum)
blood tests are requested for you today.  We'll let you know about the results. Please decrease the breakfast insulin to 10 units. check your blood sugar twice a day.  vary the time of day when you check, between before the 3 meals, and at bedtime.  also check if you have symptoms of your blood sugar being too high or too low.  please keep a record of the readings and bring it to your next appointment here.  You can write it on any piece of paper.  please call us sooner if your blood sugar goes below 70, or if you have a lot of readings over 200. Please come back for a follow-up appointment in 3 months.

## 2014-10-09 NOTE — Telephone Encounter (Signed)
please call patient: i forgot to mention on results: Vitamin-D is low. i have sent a prescription to your pharmacy, to take once a week, x 12 weeks, then you are done. i'll see you next time.

## 2014-10-10 LAB — PTH, INTACT AND CALCIUM
Calcium: 9.5 mg/dL (ref 8.4–10.5)
PTH: 111 pg/mL — AB (ref 14–64)

## 2014-10-10 NOTE — Telephone Encounter (Signed)
Patient advised of note below and voiced understanding.  

## 2014-10-21 DIAGNOSIS — R350 Frequency of micturition: Secondary | ICD-10-CM | POA: Diagnosis not present

## 2014-10-21 DIAGNOSIS — N281 Cyst of kidney, acquired: Secondary | ICD-10-CM | POA: Diagnosis not present

## 2014-10-21 DIAGNOSIS — C61 Malignant neoplasm of prostate: Secondary | ICD-10-CM | POA: Diagnosis not present

## 2014-10-21 DIAGNOSIS — N189 Chronic kidney disease, unspecified: Secondary | ICD-10-CM | POA: Diagnosis not present

## 2014-12-23 ENCOUNTER — Other Ambulatory Visit: Payer: Self-pay | Admitting: Endocrinology

## 2015-01-08 ENCOUNTER — Ambulatory Visit: Payer: Commercial Managed Care - HMO | Admitting: Endocrinology

## 2015-01-26 DIAGNOSIS — H35039 Hypertensive retinopathy, unspecified eye: Secondary | ICD-10-CM | POA: Diagnosis not present

## 2015-01-26 DIAGNOSIS — E119 Type 2 diabetes mellitus without complications: Secondary | ICD-10-CM | POA: Diagnosis not present

## 2015-01-26 DIAGNOSIS — H04123 Dry eye syndrome of bilateral lacrimal glands: Secondary | ICD-10-CM | POA: Diagnosis not present

## 2015-01-29 ENCOUNTER — Ambulatory Visit (INDEPENDENT_AMBULATORY_CARE_PROVIDER_SITE_OTHER): Payer: Commercial Managed Care - HMO | Admitting: Endocrinology

## 2015-01-29 ENCOUNTER — Encounter: Payer: Self-pay | Admitting: Endocrinology

## 2015-01-29 VITALS — BP 140/84 | HR 77 | Temp 98.8°F | Ht 67.0 in | Wt 219.0 lb

## 2015-01-29 DIAGNOSIS — M25569 Pain in unspecified knee: Secondary | ICD-10-CM | POA: Insufficient documentation

## 2015-01-29 DIAGNOSIS — M25561 Pain in right knee: Secondary | ICD-10-CM | POA: Diagnosis not present

## 2015-01-29 DIAGNOSIS — E559 Vitamin D deficiency, unspecified: Secondary | ICD-10-CM | POA: Diagnosis not present

## 2015-01-29 DIAGNOSIS — E1022 Type 1 diabetes mellitus with diabetic chronic kidney disease: Secondary | ICD-10-CM | POA: Diagnosis not present

## 2015-01-29 DIAGNOSIS — N259 Disorder resulting from impaired renal tubular function, unspecified: Secondary | ICD-10-CM

## 2015-01-29 DIAGNOSIS — N189 Chronic kidney disease, unspecified: Secondary | ICD-10-CM

## 2015-01-29 MED ORDER — INSULIN REGULAR HUMAN 100 UNIT/ML IJ SOLN: 15.0000 [IU] | Freq: Two times a day (BID) | INTRAMUSCULAR | Status: DC

## 2015-01-29 NOTE — Patient Instructions (Addendum)
blood tests are requested for you today.  We'll let you know about the results. check your blood sugar twice a day.  vary the time of day when you check, between before the 3 meals, and at bedtime.  also check if you have symptoms of your blood sugar being too high or too low.  please keep a record of the readings and bring it to your next appointment here.  You can write it on any piece of paper.  please call us sooner if your blood sugar goes below 70, or if you have a lot of readings over 200. Please come back for a follow-up appointment in 3 months.  Please take regular insulin, 15 units with breakfast and with supper.   Please continue the same NPH at night.   Please let me know if you want to see a specialist for your knee.

## 2015-01-29 NOTE — Progress Notes (Signed)
Subjective:    Patient ID: John Fry, male    DOB: 02-Jul-1940, 74 y.o.   MRN: [id number]  HPI Pt returns for f/u of diabetes mellitus: DM type: 1 Dx'ed: 2778 Complications: none.   Therapy: insulin since dx DKA: only at dx.   Severe hypoglycemia: never Pancreatitis: never.   Other:  he takes human insulin, due to cost; he takes multiple daily injections Interval history: Pt says he gets mild hypoglycemia in the afternoon, even if he takes just 10 units with lunch.  He takes 15 units with breakfast, and 15 with the evening meal.  Pt says he never misses the insulin.  no cbg record, but states cbg's are highest in am (he does not check at hs).   Past Medical History  Diagnosis Date  . RENAL INSUFFICIENCY 05/22/2008  . SHOULDER PAIN, RIGHT 05/08/2008  . VENTRICULAR HYPERTROPHY, LEFT 05/08/2008  . DYSLIPIDEMIA 03/05/2007  . HERNIA 03/05/2007  . HYPERTENSION 03/05/2007  . 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 Diabetes mellitus without complication   . S/P radiation therapy 04/14/2014 through 06/11/2014                                                       Prostate 7800 cGy in 40 sessions, seminal vesicles 5600 cGy in 40 sessions                          Past Surgical History  Procedure Laterality Date  . Foot surgery  1999    Right Foot Bunion Repair  . Lower arterial  05/06/2003  . Prostate biopsy  07/23/13    gleason 7, volume 53 ml  . Remove fatty tumor Left 1995    thigh    Social History   Social History  . Marital Status: Married    Spouse Name: N/A  . Number of Children: N/A  . Years of Education: N/A   Occupational History  .      Retired   Social History Main Topics  . Smoking status: Former Smoker -- 0.50 packs/day for 20 years    Quit date: 06/20/2006  . Smokeless tobacco: Never Used  . Alcohol Use: No  . Drug Use: No  . Sexual Activity: Not on file   Other Topics Concern  . Not on file   Social History Narrative     Current Outpatient Prescriptions on File Prior to Visit  Medication Sig Dispense Refill  . amLODipine (NORVASC) 10 MG tablet Take 1 tablet (10 mg total) by mouth daily. 90 tablet 3  . atorvastatin (LIPITOR) 40 MG tablet Take 1 tablet (40 mg total) by mouth daily. 90 tablet 3  . bd getting started take home kit MISC 1 kit by Other route once. 1 kit 0  . glucose blood (ACCU-CHEK AVIVA) test strip 1 each by Other route 2 (two) times daily. And lancets 2/day 250.01 100 each 12  . hydrochlorothiazide (HYDRODIURIL) 12.5 MG tablet Take 1 tablet (12.5 mg total) by mouth daily. 90 tablet 3  . Insulin Syringe-Needle U-100 (INSULIN SYRINGE .5CC/31GX5/16") 31G X 5/16" 0.5 ML MISC 1 Device by Does not apply route 4 (four) times daily. 120 each 11  . MOVIPREP 100 G SOLR Take 1 kit (200 g total) by  mouth once. 1 kit 0  . NOVOLIN N RELION 100 UNIT/ML injection INJECT 30 UNITS UNDER THE SKIN AT BEDTIME 10 mL 2  . simvastatin (ZOCOR) 80 MG tablet     . tamsulosin (FLOMAX) 0.4 MG CAPS capsule Take 1 capsule (0.4 mg total) by mouth daily. 30 capsule 3   No current facility-administered medications on file prior to visit.    Allergies  Allergen Reactions  . Zestril [Lisinopril]     angioedema    Family History  Problem Relation Age of Onset  . Hypertension Neg Hx   . Colon cancer Neg Hx   . Cancer Father 49    prostate  . Kidney Stones Brother   . Diabetes Sister     BP 140/84 mmHg  Pulse 77  Temp(Src) 98.8 F (37.1 C) (Oral)  Ht 5' 7"  (1.702 m)  Wt 219 lb (99.338 kg)  BMI 34.29 kg/m2  SpO2 96%    Review of Systems Denies LOC.  He has right knee pain (no injury)    Objective:   Physical Exam VITAL SIGNS:  See vs page GENERAL: no distress Pulses: dorsalis pedis intact bilat.   MSK: no deformity of the feet CV: trace bilat leg edema. Skin:  no ulcer on the feet.  normal color and temp on the feet. Neuro: sensation is intact to touch on the feet.   Ext: There is bilateral  onychomycosis of the toenails  Right knee: no tend/swelling.  Full rom without pain.   Gait: slightly favors RLE   Lab Results  Component Value Date   HGBA1C 7.7* 01/29/2015      Assessment & Plan:  DM: he needs increased rx Knee pain, new, ? etiol.  Pt declines ref specialist.    Patient is advised the following: Patient Instructions  blood tests are requested for you today.  We'll let you know about the results. check your blood sugar twice a day.  vary the time of day when you check, between before the 3 meals, and at bedtime.  also check if you have symptoms of your blood sugar being too high or too low.  please keep a record of the readings and bring it to your next appointment here.  You can write it on any piece of paper.  please call us sooner if your blood sugar goes below 70, or if you have a lot of readings over 200. Please come back for a follow-up appointment in 3 months.  Please take regular insulin, 15 units with breakfast and with supper.   Please continue the same NPH at night.   Please let me know if you want to see a specialist for your knee.     Addendum: increase supper reg to 20 units

## 2015-01-30 LAB — BASIC METABOLIC PANEL
BUN: 19 mg/dL (ref 6–23)
CO2: 24 mEq/L (ref 19–32)
Calcium: 9.4 mg/dL (ref 8.4–10.5)
Chloride: 107 mEq/L (ref 96–112)
Creatinine, Ser: 1.79 mg/dL — ABNORMAL HIGH (ref 0.40–1.50)
GFR: 47.89 mL/min — ABNORMAL LOW (ref >60.00)
Glucose, Bld: 80 mg/dL (ref 70–99)
POTASSIUM: 4 meq/L (ref 3.5–5.1)
Sodium: 141 mEq/L (ref 135–145)

## 2015-01-30 LAB — VITAMIN D 25 HYDROXY (VIT D DEFICIENCY, FRACTURES): VITD: 47.19 ng/mL (ref 30.00–100.00)

## 2015-01-30 LAB — PTH, INTACT AND CALCIUM
Calcium: 9.4 mg/dL (ref 8.4–10.5)
PTH: 129 pg/mL — ABNORMAL HIGH (ref 14–64)

## 2015-01-30 LAB — URIC ACID: Uric Acid, Serum: 6.5 mg/dL (ref 4.0–7.8)

## 2015-01-30 LAB — HEMOGLOBIN A1C: Hgb A1c MFr Bld: 7.7 % — ABNORMAL HIGH (ref 4.6–6.5)

## 2015-01-30 LAB — SEDIMENTATION RATE: Sed Rate: 10 mm/hr (ref 0–22)

## 2015-02-16 ENCOUNTER — Telehealth: Payer: Self-pay

## 2015-02-16 NOTE — Telephone Encounter (Signed)
Pt will get their HgA1c rechecked on 05/01/2015

## 2015-02-18 ENCOUNTER — Telehealth: Payer: Self-pay

## 2015-02-18 NOTE — Telephone Encounter (Signed)
Pt will get B/P recheck on f/u visit 05/01/2015

## 2015-04-22 ENCOUNTER — Telehealth: Payer: Self-pay | Admitting: Endocrinology

## 2015-04-22 DIAGNOSIS — R972 Elevated prostate specific antigen [PSA]: Secondary | ICD-10-CM

## 2015-04-22 DIAGNOSIS — C61 Malignant neoplasm of prostate: Secondary | ICD-10-CM

## 2015-04-22 NOTE — Telephone Encounter (Signed)
Patient need referral to see  DR. Clint Lipps he has an appointmentt Monday 7th 9:45.

## 2015-04-23 NOTE — Telephone Encounter (Signed)
done

## 2015-04-23 NOTE — Telephone Encounter (Signed)
See note below. Dr. Tresa Moore is a urology specialist.

## 2015-04-27 DIAGNOSIS — R350 Frequency of micturition: Secondary | ICD-10-CM | POA: Diagnosis not present

## 2015-04-27 DIAGNOSIS — N189 Chronic kidney disease, unspecified: Secondary | ICD-10-CM | POA: Diagnosis not present

## 2015-04-27 DIAGNOSIS — C61 Malignant neoplasm of prostate: Secondary | ICD-10-CM | POA: Diagnosis not present

## 2015-04-27 DIAGNOSIS — N281 Cyst of kidney, acquired: Secondary | ICD-10-CM | POA: Diagnosis not present

## 2015-05-01 ENCOUNTER — Ambulatory Visit (INDEPENDENT_AMBULATORY_CARE_PROVIDER_SITE_OTHER): Payer: Commercial Managed Care - HMO | Admitting: Endocrinology

## 2015-05-01 ENCOUNTER — Encounter: Payer: Self-pay | Admitting: Endocrinology

## 2015-05-01 VITALS — BP 146/84 | HR 66 | Temp 98.2°F | Ht 67.0 in | Wt 219.0 lb

## 2015-05-01 DIAGNOSIS — Z23 Encounter for immunization: Secondary | ICD-10-CM

## 2015-05-01 DIAGNOSIS — N189 Chronic kidney disease, unspecified: Secondary | ICD-10-CM | POA: Diagnosis not present

## 2015-05-01 DIAGNOSIS — E1022 Type 1 diabetes mellitus with diabetic chronic kidney disease: Secondary | ICD-10-CM | POA: Diagnosis not present

## 2015-05-01 LAB — POCT GLYCOSYLATED HEMOGLOBIN (HGB A1C): HEMOGLOBIN A1C: 7.5

## 2015-05-01 MED ORDER — LIDOCAINE HCL 2 % EX GEL: 1.0000 "application " | CUTANEOUS | Status: DC | PRN

## 2015-05-01 NOTE — Progress Notes (Signed)
Subjective:    Patient ID: John Fry, male    DOB: 1941/05/10, 74 y.o.   MRN: [id number]  HPI Pt returns for f/u of diabetes mellitus: DM type: 1 Dx'ed: 8676 Complications: none.   Therapy: insulin since dx DKA: only at dx.   Severe hypoglycemia: never. Pancreatitis: never.   Other:  he takes human insulin, due to cost; he takes multiple daily injections.   Interval history: Pt says he has mild hypoglycemia approx every other day, and most commonly happens in the afternoon.  It is otherwise well-controlled.  This happens with exercise.  pt states he feels well in general.   Past Medical History  Diagnosis Date  . RENAL INSUFFICIENCY 05/22/2008  . SHOULDER PAIN, RIGHT 05/08/2008  . VENTRICULAR HYPERTROPHY, LEFT 05/08/2008  . DYSLIPIDEMIA 03/05/2007  . HERNIA 03/05/2007  . HYPERTENSION 03/05/2007  . 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 Diabetes mellitus without complication ([place])   . S/P radiation therapy 04/14/2014 through 06/11/2014                                                       Prostate 7800 cGy in 40 sessions, seminal vesicles 5600 cGy in 40 sessions                          Past Surgical History  Procedure Laterality Date  . Foot surgery  1999    Right Foot Bunion Repair  . Lower arterial  05/06/2003  . Prostate biopsy  07/23/13    gleason 7, volume 53 ml  . Remove fatty tumor Left 1995    thigh    Social History   Social History  . Marital Status: Married    Spouse Name: N/A  . Number of Children: N/A  . Years of Education: N/A   Occupational History  .      Retired   Social History Main Topics  . Smoking status: Former Smoker -- 0.50 packs/day for 20 years    Quit date: 06/20/2006  . Smokeless tobacco: Never Used  . Alcohol Use: No  . Drug Use: No  . Sexual Activity: Not on file   Other Topics Concern  . Not on file   Social History Narrative    Current Outpatient Prescriptions on File Prior to Visit    Medication Sig Dispense Refill  . amLODipine (NORVASC) 10 MG tablet Take 1 tablet (10 mg total) by mouth daily. 90 tablet 3  . atorvastatin (LIPITOR) 40 MG tablet Take 1 tablet (40 mg total) by mouth daily. 90 tablet 3  . bd getting started take home kit MISC 1 kit by Other route once. 1 kit 0  . glucose blood (ACCU-CHEK AVIVA) test strip 1 each by Other route 2 (two) times daily. And lancets 2/day 250.01 100 each 12  . hydrochlorothiazide (HYDRODIURIL) 12.5 MG tablet Take 1 tablet (12.5 mg total) by mouth daily. 90 tablet 3  . insulin regular (HUMULIN R) 100 units/mL injection Inject 0.15 mLs (15 Units total) into the skin 2 (two) times daily before a meal. 20 mL 11  . Insulin Syringe-Needle U-100 (INSULIN SYRINGE .5CC/31GX5/16") 31G X 5/16" 0.5 ML MISC 1 Device by Does not apply route 4 (four) times daily. 120 each 11  .  MOVIPREP 100 G SOLR Take 1 kit (200 g total) by mouth once. 1 kit 0  . NOVOLIN N RELION 100 UNIT/ML injection INJECT 30 UNITS UNDER THE SKIN AT BEDTIME 10 mL 2  . simvastatin (ZOCOR) 80 MG tablet     . tamsulosin (FLOMAX) 0.4 MG CAPS capsule Take 1 capsule (0.4 mg total) by mouth daily. (Patient not taking: Reported on 05/01/2015) 30 capsule 3   No current facility-administered medications on file prior to visit.    Allergies  Allergen Reactions  . Zestril [Lisinopril]     angioedema    Family History  Problem Relation Age of Onset  . Hypertension Neg Hx   . Colon cancer Neg Hx   . Cancer Father 22    prostate  . Kidney Stones Brother   . Diabetes Sister     BP 146/84 mmHg  Pulse 66  Temp(Src) 98.2 F (36.8 C) (Oral)  Ht 5' 7"  (1.702 m)  Wt 219 lb (99.338 kg)  BMI 34.29 kg/m2  SpO2 97%  Review of Systems Denies LOC.  He has slight pain at the right shoulder x a few mos (no injury).  No numbness.      Objective:   Physical Exam VITAL SIGNS: See vs page.   GENERAL: no distress.  Pulses: dorsalis pedis intact bilat.  MSK: no deformity of the  feet CV: trace bilat leg edema.  Skin: no ulcer on the feet. normal color and temp on the feet. Neuro: sensation is intact to touch on the feet.  Ext: There is bilateral onychomycosis of the toenails.   Right shoulder: nontender.  Full rim, but rom is painful.     A1c=7.5%    Assessment & Plan:  Shoulder pain, new, uncertain etiology.  he declines x-ray and ref sports medicine HTN: with ? of situational component.  We'll recheck next time DM: this is the best control this pt should aim for, given advanced age and variable cbg's.    Patient is advised the following: Patient Instructions  check your blood sugar twice a day.  vary the time of day when you check, between before the 3 meals, and at bedtime.  also check if you have symptoms of your blood sugar being too high or too low.  please keep a record of the readings and bring it to your next appointment here.  You can write it on any piece of paper.  please call us sooner if your blood sugar goes below 70, or if you have a lot of readings over 200. Please come back for a regular physical appointment in 3 months.   Eat a light snack with any exercise.  Please continue the same insulins.   i have sent a prescription to your pharmacy, for a gel to put on your right shoulder.

## 2015-05-01 NOTE — Patient Instructions (Addendum)
check your blood sugar twice a day.  vary the time of day when you check, between before the 3 meals, and at bedtime.  also check if you have symptoms of your blood sugar being too high or too low.  please keep a record of the readings and bring it to your next appointment here.  You can write it on any piece of paper.  please call us sooner if your blood sugar goes below 70, or if you have a lot of readings over 200. Please come back for a regular physical appointment in 3 months.   Eat a light snack with any exercise.  Please continue the same insulins.   i have sent a prescription to your pharmacy, for a gel to put on your right shoulder.

## 2015-05-22 ENCOUNTER — Other Ambulatory Visit: Payer: Self-pay | Admitting: Endocrinology

## 2015-07-16 ENCOUNTER — Other Ambulatory Visit: Payer: Self-pay | Admitting: Endocrinology

## 2015-07-31 ENCOUNTER — Encounter: Payer: Self-pay | Admitting: Endocrinology

## 2015-07-31 ENCOUNTER — Ambulatory Visit (INDEPENDENT_AMBULATORY_CARE_PROVIDER_SITE_OTHER): Payer: Commercial Managed Care - HMO | Admitting: Endocrinology

## 2015-07-31 VITALS — BP 162/97 | HR 74 | Temp 98.2°F | Ht 67.0 in | Wt 222.0 lb

## 2015-07-31 DIAGNOSIS — Z0189 Encounter for other specified special examinations: Secondary | ICD-10-CM

## 2015-07-31 DIAGNOSIS — N189 Chronic kidney disease, unspecified: Secondary | ICD-10-CM | POA: Diagnosis not present

## 2015-07-31 DIAGNOSIS — Z0000 Encounter for general adult medical examination without abnormal findings: Secondary | ICD-10-CM

## 2015-07-31 DIAGNOSIS — E1022 Type 1 diabetes mellitus with diabetic chronic kidney disease: Secondary | ICD-10-CM | POA: Diagnosis not present

## 2015-07-31 MED ORDER — INSULIN NPH (HUMAN) (ISOPHANE) 100 UNIT/ML ~~LOC~~ SUSP: 35.0000 [IU] | Freq: Every day | SUBCUTANEOUS | Status: DC

## 2015-07-31 NOTE — Patient Instructions (Addendum)
check your blood sugar twice a day.  vary the time of day when you check, between before the 3 meals, and at bedtime.  also check if you have symptoms of your blood sugar being too high or too low.  please keep a record of the readings and bring it to your next appointment here.  You can write it on any piece of paper.  please call us sooner if your blood sugar goes below 70, or if you have a lot of readings over 200. Please come back for a regular physical appointment in 3 months.   Eat a light snack with any exercise.  Please increase the NPH to 35 units at bedtime, and: same regular insulin.

## 2015-07-31 NOTE — Progress Notes (Signed)
Subjective:    Patient ID: John Fry, male    DOB: [birth year], 75 y.o.   MRN: [id number]  HPI Pt returns for f/u of diabetes mellitus: DM type: 1 Dx'ed: 6384 Complications: none.   Therapy: insulin since dx DKA: only at dx.   Severe hypoglycemia: never. Pancreatitis: never.   Other:  he takes human insulin, due to cost; he takes multiple daily injections.   Interval history: no cbg record, but states cbg's are highest in am (mid-100's).  He has hypoglycemia 2-3 times per month.  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  Past Medical History  Diagnosis Date  . RENAL INSUFFICIENCY 05/22/2008  . SHOULDER PAIN, RIGHT 05/08/2008  . VENTRICULAR HYPERTROPHY, LEFT 05/08/2008  . DYSLIPIDEMIA 03/05/2007  . HERNIA 03/05/2007  . HYPERTENSION 03/05/2007  . 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 Diabetes mellitus without complication ([place])   . S/P radiation therapy 04/14/2014 through 06/11/2014                                                       Prostate 7800 cGy in 40 sessions, seminal vesicles 5600 cGy in 40 sessions                          Past Surgical History  Procedure Laterality Date  . Foot surgery  1999    Right Foot Bunion Repair  . Lower arterial  05/06/2003  . Prostate biopsy  07/23/13    gleason 7, volume 53 ml  . Remove fatty tumor Left 1995    thigh    Social History   Social History  . Marital Status: Married    Spouse Name: N/A  . Number of Children: N/A  . Years of Education: N/A   Occupational History  .      Retired   Social History Main Topics  . Smoking status: Former Smoker -- 0.50 packs/day for 20 years    Quit date: 06/20/2006  . Smokeless tobacco: Never Used  . Alcohol Use: No  . Drug Use: No  . Sexual Activity: Not on file   Other Topics Concern  . Not on file   Social History Narrative    Current Outpatient Prescriptions on File Prior to Visit  Medication Sig Dispense Refill  . amLODipine (NORVASC)  10 MG tablet TAKE ONE TABLET BY MOUTH ONCE DAILY 90 tablet 0  . atorvastatin (LIPITOR) 40 MG tablet TAKE ONE TABLET BY MOUTH ONCE DAILY 90 tablet 0  . bd getting started take home kit MISC 1 kit by Other route once. 1 kit 0  . glucose blood (ACCU-CHEK AVIVA) test strip 1 each by Other route 2 (two) times daily. And lancets 2/day 250.01 100 each 12  . hydrochlorothiazide (HYDRODIURIL) 12.5 MG tablet Take 1 tablet (12.5 mg total) by mouth daily. 90 tablet 3  . insulin regular (HUMULIN R) 100 units/mL injection Inject 0.15 mLs (15 Units total) into the skin 2 (two) times daily before a meal. 20 mL 11  . Insulin Syringe-Needle U-100 (INSULIN SYRINGE .5CC/31GX5/16") 31G X 5/16" 0.5 ML MISC 1 Device by Does not apply route 4 (four) times daily. 120 each 11  . lidocaine (XYLOCAINE) 2 % jelly Apply 1 application topically as needed. 30 mL  5  . MOVIPREP 100 G SOLR Take 1 kit (200 g total) by mouth once. 1 kit 0  . simvastatin (ZOCOR) 80 MG tablet Reported on 07/31/2015    . tamsulosin (FLOMAX) 0.4 MG CAPS capsule Take 1 capsule (0.4 mg total) by mouth daily. (Patient not taking: Reported on 05/01/2015) 30 capsule 3   No current facility-administered medications on file prior to visit.    Allergies  Allergen Reactions  . Zestril [Lisinopril]     angioedema    Family History  Problem Relation Age of Onset  . Hypertension Neg Hx   . Colon cancer Neg Hx   . Cancer Father 42    prostate  . Kidney Stones Brother   . Diabetes Sister     BP 162/97 mmHg  Pulse 74  Temp(Src) 98.2 F (36.8 C) (Oral)  Ht _0  (1.702 m)  Wt 222 lb (100.699 kg)  BMI 34.76 kg/m2  SpO2 97%  Review of Systems denies LOC    Objective:   Physical Exam VITAL SIGNS: See vs page.  GENERAL: no distress.  Pulses: dorsalis pedis intact bilat.  MSK: no deformity of the feet CV: trace bilat leg edema.  Skin: no ulcer on the feet. normal color and temp on the feet. Neuro: sensation is intact to touch on the  feet.  Ext: There is bilateral onychomycosis of the toenails.    A1c=8.2%    Assessment & Plan:  DM: he needs increased rx.   HTN: he needs increased rx. Occupational status: this is causing mild hypoglycemia.     Patient is advised the following: Patient Instructions  check your blood sugar twice a day.  vary the time of day when you check, between before the 3 meals, and at bedtime.  also check if you have symptoms of your blood sugar being too high or too low.  please keep a record of the readings and bring it to your next appointment here.  You can write it on any piece of paper.  please call us sooner if your blood sugar goes below 70, or if you have a lot of readings over 200. Please come back for a regular physical appointment in 3 months.   Eat a light snack with any exercise.  Please increase the NPH to 35 units at bedtime, and: same regular insulin.

## 2015-08-01 ENCOUNTER — Telehealth: Payer: Self-pay | Admitting: Endocrinology

## 2015-08-01 MED ORDER — ISOSORB DINITRATE-HYDRALAZINE 20-37.5 MG PO TABS: 0.5000 | ORAL_TABLET | Freq: Three times a day (TID) | ORAL | Status: DC

## 2015-08-01 NOTE — Telephone Encounter (Signed)
please call patient: i reviewed your visit, and we need to add another BP pill. i have sent a prescription to your pharmacy

## 2015-08-03 NOTE — Telephone Encounter (Signed)
Requested a call back from the pt to discuss.  

## 2015-08-03 NOTE — Telephone Encounter (Signed)
Attempted to reach the pt. Will try again at a later time.  

## 2015-08-03 NOTE — Telephone Encounter (Signed)
Pt returning your phone call

## 2015-08-04 ENCOUNTER — Encounter: Payer: Self-pay | Admitting: Internal Medicine

## 2015-08-04 NOTE — Telephone Encounter (Signed)
Attempted to reach the pt. Phone number was busy. Will try again at a later time.

## 2015-08-04 NOTE — Telephone Encounter (Signed)
Attempted to reach the pt. Will try again at later time.

## 2015-08-05 MED ORDER — HYDRALAZINE HCL 10 MG PO TABS: 10.0000 mg | ORAL_TABLET | Freq: Three times a day (TID) | ORAL | Status: DC

## 2015-08-05 NOTE — Telephone Encounter (Signed)
I reached the pt and advised we had sent a rx for a new bp medication. The medication was going to cost the pt 49$ and he stated he would not afford to purchase the medication. Please advise, Thanks!

## 2015-08-05 NOTE — Telephone Encounter (Signed)
Pt advised of note below and voiced understanding.  

## 2015-08-05 NOTE — Telephone Encounter (Signed)
This pill has 2 components: i have sent a prescription to your pharmacy, for 1 of these, to start with

## 2015-08-14 ENCOUNTER — Other Ambulatory Visit: Payer: Self-pay | Admitting: *Deleted

## 2015-08-14 MED ORDER — HYDRALAZINE HCL 10 MG PO TABS
10.0000 mg | ORAL_TABLET | Freq: Three times a day (TID) | ORAL | Status: DC
Start: 2015-08-14 — End: 2015-09-01

## 2015-08-14 MED ORDER — ALCOHOL PREP PAD 70 % PADS: MEDICATED_PAD | Status: DC

## 2015-08-14 MED ORDER — ATORVASTATIN CALCIUM 40 MG PO TABS: 40.0000 mg | ORAL_TABLET | Freq: Every day | ORAL | Status: DC

## 2015-08-14 MED ORDER — TRUE METRIX LEVEL 1 LOW VI SOLN: Status: DC

## 2015-08-18 ENCOUNTER — Other Ambulatory Visit: Payer: Self-pay

## 2015-08-18 MED ORDER — TRUEPLUS LANCETS 30G MISC: Status: DC

## 2015-08-18 MED ORDER — ATORVASTATIN CALCIUM 40 MG PO TABS: 40.0000 mg | ORAL_TABLET | Freq: Every day | ORAL | Status: DC

## 2015-08-18 MED ORDER — TRUE METRIX LEVEL 1 LOW VI SOLN: Status: DC

## 2015-08-18 MED ORDER — HYDROCHLOROTHIAZIDE 12.5 MG PO TABS: 12.5000 mg | ORAL_TABLET | Freq: Every day | ORAL | Status: DC

## 2015-08-18 MED ORDER — GLUCOSE BLOOD VI STRP: ORAL_STRIP | Status: DC

## 2015-08-18 MED ORDER — TRUE METRIX AIR GLUCOSE METER DEVI: 2.0000 | Freq: Every day | Status: DC

## 2015-08-18 MED ORDER — ALCOHOL PREP PAD 70 % PADS: MEDICATED_PAD | Status: DC

## 2015-09-01 ENCOUNTER — Other Ambulatory Visit: Payer: Self-pay | Admitting: *Deleted

## 2015-09-01 MED ORDER — HYDRALAZINE HCL 10 MG PO TABS: 10.0000 mg | ORAL_TABLET | Freq: Three times a day (TID) | ORAL | Status: DC

## 2015-09-16 ENCOUNTER — Ambulatory Visit (AMBULATORY_SURGERY_CENTER): Payer: Self-pay | Admitting: *Deleted

## 2015-09-16 VITALS — Ht 68.0 in | Wt 221.6 lb

## 2015-09-16 DIAGNOSIS — Z86010 Personal history of colonic polyps: Secondary | ICD-10-CM

## 2015-09-16 MED ORDER — NA SULFATE-K SULFATE-MG SULF 17.5-3.13-1.6 GM/177ML PO SOLN: 1.0000 | Freq: Once | ORAL | Status: DC

## 2015-09-16 NOTE — Progress Notes (Signed)
Denies allergies to eggs or soy products. Denies complications with sedation or anesthesia. Denies O2 use. Denies use of diet or weight loss medications.  Emmi instructions not given for colonoscopy, pt does not have Internet access.

## 2015-09-30 ENCOUNTER — Ambulatory Visit (AMBULATORY_SURGERY_CENTER): Payer: Commercial Managed Care - HMO | Admitting: Internal Medicine

## 2015-09-30 ENCOUNTER — Encounter: Payer: Self-pay | Admitting: Internal Medicine

## 2015-09-30 VITALS — BP 150/99 | HR 61 | Temp 97.3°F | Resp 19 | Ht 68.0 in | Wt 221.0 lb

## 2015-09-30 DIAGNOSIS — Z1211 Encounter for screening for malignant neoplasm of colon: Secondary | ICD-10-CM | POA: Diagnosis not present

## 2015-09-30 DIAGNOSIS — Z86010 Personal history of colonic polyps: Secondary | ICD-10-CM | POA: Diagnosis present

## 2015-09-30 DIAGNOSIS — D123 Benign neoplasm of transverse colon: Secondary | ICD-10-CM

## 2015-09-30 DIAGNOSIS — E119 Type 2 diabetes mellitus without complications: Secondary | ICD-10-CM | POA: Diagnosis not present

## 2015-09-30 DIAGNOSIS — D120 Benign neoplasm of cecum: Secondary | ICD-10-CM | POA: Diagnosis not present

## 2015-09-30 LAB — GLUCOSE, CAPILLARY
GLUCOSE-CAPILLARY: 159 mg/dL — AB (ref 65–99)
Glucose-Capillary: 152 mg/dL — ABNORMAL HIGH (ref 65–99)

## 2015-09-30 MED ORDER — SODIUM CHLORIDE 0.9 % IV SOLN: 500.0000 mL | INTRAVENOUS | Status: DC

## 2015-09-30 NOTE — Patient Instructions (Signed)
Discharge instructions given. Handouts on polyps,diverticulosis and hemorrhoids. Resume previous medications. YOU HAD AN ENDOSCOPIC PROCEDURE TODAY AT THE [facility]:   Refer to the procedure report that was given to you for any specific questions about what was found during the examination.  If the procedure report does not answer your questions, please call your gastroenterologist to clarify.  If you requested that your care partner not be given the details of your procedure findings, then the procedure report has been included in a sealed envelope for you to review at your convenience later.  YOU SHOULD EXPECT: Some feelings of bloating in the abdomen. Passage of more gas than usual.  Walking can help get rid of the air that was put into your GI tract during the procedure and reduce the bloating. If you had a lower endoscopy (such as a colonoscopy or flexible sigmoidoscopy) you may notice spotting of blood in your stool or on the toilet paper. If you underwent a bowel prep for your procedure, you may not have a normal bowel movement for a few days.  Please Note:  You might notice some irritation and congestion in your nose or some drainage.  This is from the oxygen used during your procedure.  There is no need for concern and it should clear up in a day or so.  SYMPTOMS TO REPORT IMMEDIATELY:   Following lower endoscopy (colonoscopy or flexible sigmoidoscopy):  Excessive amounts of blood in the stool  Significant tenderness or worsening of abdominal pains  Swelling of the abdomen that is new, acute  Fever of 100F or higher   For urgent or emergent issues, a gastroenterologist can be reached at any hour by calling [phone].   DIET: Your first meal following the procedure should be a small meal and then it is ok to progress to your normal diet. Heavy or fried foods are harder to digest and may make you feel nauseous or bloated.  Likewise, meals heavy in dairy and  vegetables can increase bloating.  Drink plenty of fluids but you should avoid alcoholic beverages for 24 hours.  ACTIVITY:  You should plan to take it easy for the rest of today and you should NOT DRIVE or use heavy machinery until tomorrow (because of the sedation medicines used during the test).    FOLLOW UP: Our staff will call the number listed on your records the next business day following your procedure to check on you and address any questions or concerns that you may have regarding the information given to you following your procedure. If we do not reach you, we will leave a message.  However, if you are feeling well and you are not experiencing any problems, there is no need to return our call.  We will assume that you have returned to your regular daily activities without incident.  If any biopsies were taken you will be contacted by phone or by letter within the next 1-3 weeks.  Please call us at [phone] if you have not heard about the biopsies in 3 weeks.    SIGNATURES/CONFIDENTIALITY: You and/or your care partner have signed paperwork which will be entered into your electronic medical record.  These signatures attest to the fact that that the information above on your After Visit Summary has been reviewed and is understood.  Full responsibility of the confidentiality of this discharge information lies with you and/or your care-partner. 

## 2015-09-30 NOTE — Progress Notes (Signed)
A/ox3 pleased with MAC, report to Celia RN 

## 2015-09-30 NOTE — Progress Notes (Signed)
Called to room to assist during endoscopic procedure.  Patient ID and intended procedure confirmed with present staff. Received instructions for my participation in the procedure from the performing physician.  

## 2015-09-30 NOTE — Op Note (Signed)
[facility] Patient Name: John Fry Procedure Date: 09/30/2015 9:55 AM MRN: [id number] Endoscopist: Docia Chuck. Henrene Pastor , MD Age: 75 Date of Birth: 08-19-1940 Gender: Male Procedure:                Colonoscopy with snare polypectomy -3 Indications:              Surveillance: Personal history of adenomatous                            polyps on last colonoscopy > 5 years ago. Previous                            examination January 2010 with small tubular adenoma Medicines:                Monitored Anesthesia Care Procedure:                Pre-Anesthesia Assessment:                           - Prior to the procedure, a History and Physical                            was performed, and patient medications and                            allergies were reviewed. The patient's tolerance of                            previous anesthesia was also reviewed. The risks                            and benefits of the procedure and the sedation                            options and risks were discussed with the patient.                            All questions were answered, and informed consent                            was obtained. Prior Anticoagulants: The patient has                            taken no previous anticoagulant or antiplatelet                            agents. ASA Grade Assessment: II - A patient with                            mild systemic disease. After reviewing the risks                            and benefits, the patient was deemed in  satisfactory condition to undergo the procedure.                           After obtaining informed consent, the colonoscope                            was passed under direct vision. Throughout the                            procedure, the patient's blood pressure, pulse, and                            oxygen saturations were monitored continuously. The                            Model CF-HQ190L  [id number]) scope was introduced                            through the anus and advanced to the the cecum,                            identified by appendiceal orifice and ileocecal                            valve. The colonoscopy was performed without                            difficulty. The patient tolerated the procedure                            well. The quality of the bowel preparation was                            excellent. The bowel preparation used was SUPREP.                            The ileocecal valve, appendiceal orifice, and                            rectum were photographed. Scope In: 10:09:53 AM Scope Out: 10:29:23 AM Scope Withdrawal Time: 0 hours 15 minutes 55 seconds  Total Procedure Duration: 0 hours 19 minutes 30 seconds  Findings:                 The digital rectal exam was normal.                           Three polyps were found in the transverse colon and                            cecum. The polyps were 2 to 7 mm in size. These                            polyps were removed with a cold snare. Resection  and retrieval were complete.                           Multiple diverticula were found in the left colon                            and right colon.                           Internal hemorrhoids were found during retroflexion.                           The exam was otherwise without abnormality on                            direct and retroflexion views. Complications:            No immediate complications. Estimated Blood Loss:     Estimated blood loss: none. Impression:               - Three 2 to 7 mm polyps in the transverse colon                            and in the cecum, removed with a cold snare.                            Resected and retrieved.                           - Diverticulosis in the left colon and in the right                            colon.                           - Internal hemorrhoids.                            - The examination was otherwise normal on direct                            and retroflexion views. Recommendation:           - Patient has a contact number available for                            emergencies. The signs and symptoms of potential                            delayed complications were discussed with the                            patient. Return to normal activities tomorrow.                            Written discharge instructions were provided to the  patient.                           - Resume previous diet.                           - Continue present medications.                           - Await pathology results.                           - Repeat colonoscopy in 3 - 5 years for                            surveillance. Docia Chuck. Henrene Pastor, MD 09/30/2015 10:37:13 AM This report has been signed electronically. CC Letter to:             Sean A. Loanne Drilling, MD

## 2015-10-01 ENCOUNTER — Telehealth: Payer: Self-pay

## 2015-10-01 NOTE — Telephone Encounter (Signed)
  Follow up Call-  Call back number 09/30/2015  Post procedure Call Back phone  # [phone]  Permission to leave phone message Yes     Patient questions:  Do you have a fever, pain , or abdominal swelling? No. Pain Score  0 *  Have you tolerated food without any problems? Yes.    Have you been able to return to your normal activities? Yes.    Do you have any questions about your discharge instructions: Diet   No. Medications  No. Follow up visit  No.  Do you have questions or concerns about your Care? No.  Actions: * If pain score is 4 or above: No action needed, pain <4.

## 2015-10-06 ENCOUNTER — Encounter: Payer: Self-pay | Admitting: Internal Medicine

## 2015-10-19 DIAGNOSIS — C61 Malignant neoplasm of prostate: Secondary | ICD-10-CM | POA: Diagnosis not present

## 2015-10-26 ENCOUNTER — Other Ambulatory Visit: Payer: Self-pay | Admitting: Endocrinology

## 2015-10-26 DIAGNOSIS — C61 Malignant neoplasm of prostate: Secondary | ICD-10-CM

## 2015-10-28 ENCOUNTER — Ambulatory Visit: Payer: Commercial Managed Care - HMO | Admitting: Endocrinology

## 2015-11-02 ENCOUNTER — Encounter: Payer: Self-pay | Admitting: Endocrinology

## 2015-11-02 ENCOUNTER — Ambulatory Visit (INDEPENDENT_AMBULATORY_CARE_PROVIDER_SITE_OTHER): Payer: Commercial Managed Care - HMO | Admitting: Endocrinology

## 2015-11-02 VITALS — BP 136/86 | HR 80 | Temp 98.1°F | Ht 67.0 in | Wt 217.0 lb

## 2015-11-02 DIAGNOSIS — E109 Type 1 diabetes mellitus without complications: Secondary | ICD-10-CM

## 2015-11-02 DIAGNOSIS — R9431 Abnormal electrocardiogram [ECG] [EKG]: Secondary | ICD-10-CM

## 2015-11-02 DIAGNOSIS — I10 Essential (primary) hypertension: Secondary | ICD-10-CM | POA: Diagnosis not present

## 2015-11-02 DIAGNOSIS — E213 Hyperparathyroidism, unspecified: Secondary | ICD-10-CM

## 2015-11-02 DIAGNOSIS — Z0000 Encounter for general adult medical examination without abnormal findings: Secondary | ICD-10-CM

## 2015-11-02 DIAGNOSIS — Z794 Long term (current) use of insulin: Secondary | ICD-10-CM | POA: Diagnosis not present

## 2015-11-02 DIAGNOSIS — E1122 Type 2 diabetes mellitus with diabetic chronic kidney disease: Secondary | ICD-10-CM

## 2015-11-02 DIAGNOSIS — E785 Hyperlipidemia, unspecified: Secondary | ICD-10-CM | POA: Diagnosis not present

## 2015-11-02 DIAGNOSIS — C61 Malignant neoplasm of prostate: Secondary | ICD-10-CM

## 2015-11-02 DIAGNOSIS — E559 Vitamin D deficiency, unspecified: Secondary | ICD-10-CM

## 2015-11-02 DIAGNOSIS — Z0001 Encounter for general adult medical examination with abnormal findings: Secondary | ICD-10-CM

## 2015-11-02 LAB — URINALYSIS, ROUTINE W REFLEX MICROSCOPIC
Bilirubin Urine: NEGATIVE
KETONES UR: NEGATIVE
Leukocytes, UA: NEGATIVE
NITRITE: NEGATIVE
Specific Gravity, Urine: >=1.03 — AB (ref 1.000–1.030)
URINE GLUCOSE: NEGATIVE
UROBILINOGEN UA: 0.2 (ref 0.0–1.0)
pH: 5.5 (ref 5.0–8.0)

## 2015-11-02 LAB — CBC WITH DIFFERENTIAL/PLATELET
BASOS ABS: 0 10*3/uL (ref 0.0–0.1)
Basophils Relative: 0.3 % (ref 0.0–3.0)
EOS ABS: 0.2 10*3/uL (ref 0.0–0.7)
Eosinophils Relative: 2.8 % (ref 0.0–5.0)
HCT: 46.2 % (ref 39.0–52.0)
HEMOGLOBIN: 15.5 g/dL (ref 13.0–17.0)
LYMPHS ABS: 1.9 10*3/uL (ref 0.7–4.0)
Lymphocytes Relative: 24.4 % (ref 12.0–46.0)
MCHC: 33.6 g/dL (ref 30.0–36.0)
MCV: 91.7 fl (ref 78.0–100.0)
MONO ABS: 0.7 10*3/uL (ref 0.1–1.0)
Monocytes Relative: 8.5 % (ref 3.0–12.0)
NEUTROS PCT: 64 % (ref 43.0–77.0)
Neutro Abs: 5 10*3/uL (ref 1.4–7.7)
Platelets: 229 10*3/uL (ref 150.0–400.0)
RBC: 5.04 Mil/uL (ref 4.22–5.81)
RDW: 14.1 % (ref 11.5–15.5)
WBC: 7.9 10*3/uL (ref 4.0–10.5)

## 2015-11-02 LAB — BASIC METABOLIC PANEL
BUN: 24 mg/dL — ABNORMAL HIGH (ref 6–23)
CO2: 29 mEq/L (ref 19–32)
Calcium: 9.8 mg/dL (ref 8.4–10.5)
Chloride: 103 mEq/L (ref 96–112)
Creatinine, Ser: 1.55 mg/dL — ABNORMAL HIGH (ref 0.40–1.50)
GFR: 56.43 mL/min — AB (ref >60.00)
Glucose, Bld: 150 mg/dL — ABNORMAL HIGH (ref 70–99)
POTASSIUM: 4.3 meq/L (ref 3.5–5.1)
SODIUM: 140 meq/L (ref 135–145)

## 2015-11-02 LAB — MICROALBUMIN / CREATININE URINE RATIO
CREATININE, U: 215.2 mg/dL
MICROALB UR: 11.9 mg/dL — AB (ref 0.0–1.9)
Microalb Creat Ratio: 5.5 mg/g (ref 0.0–30.0)

## 2015-11-02 LAB — HEPATIC FUNCTION PANEL
ALBUMIN: 4.8 g/dL (ref 3.5–5.2)
ALT: 28 U/L (ref 0–53)
AST: 23 U/L (ref 0–37)
Alkaline Phosphatase: 97 U/L (ref 39–117)
BILIRUBIN DIRECT: 0.1 mg/dL (ref 0.0–0.3)
TOTAL PROTEIN: 7.1 g/dL (ref 6.0–8.3)
Total Bilirubin: 0.6 mg/dL (ref 0.2–1.2)

## 2015-11-02 LAB — LIPID PANEL
CHOLESTEROL: 114 mg/dL (ref 0–200)
HDL: 32.3 mg/dL — ABNORMAL LOW (ref >39.00)
LDL Cholesterol: 56 mg/dL (ref 0–99)
NonHDL: 81.92
TRIGLYCERIDES: 131 mg/dL (ref 0.0–149.0)
Total CHOL/HDL Ratio: 4
VLDL: 26.2 mg/dL (ref 0.0–40.0)

## 2015-11-02 LAB — POCT GLYCOSYLATED HEMOGLOBIN (HGB A1C): Hemoglobin A1C: 8.1

## 2015-11-02 LAB — TSH: TSH: 1.33 u[IU]/mL (ref 0.35–4.50)

## 2015-11-02 LAB — PSA: PSA: 0.48 ng/mL (ref 0.10–4.00)

## 2015-11-02 MED ORDER — INSULIN NPH (HUMAN) (ISOPHANE) 100 UNIT/ML ~~LOC~~ SUSP: 45.0000 [IU] | Freq: Every day | SUBCUTANEOUS | Status: DC

## 2015-11-02 NOTE — Patient Instructions (Addendum)
check your blood sugar twice a day.  vary the time of day when you check, between before the 3 meals, and at bedtime.  also check if you have symptoms of your blood sugar being too high or too low.  please keep a record of the readings and bring it to your next appointment here.  You can write it on any piece of paper.  please call us sooner if your blood sugar goes below 70, or if you have a lot of readings over 200. Please come back for a follow-up appointment in 4 months.  Eat a light snack with any exercise.  Please increase the NPH to 45 units at bedtime, and:  Please continue the same regular insulin.    Please go back to see the heart specialist.  you will receive a phone call, about a day and time for an appointment. Take aspirin, 81 mg per day.   blood tests are requested for you today.  We'll let you know about the results.

## 2015-11-02 NOTE — Progress Notes (Signed)
Subjective:    Patient ID: ARSENIY TOOMEY, male    DOB: December 14, 1940, 75 y.o.   MRN: [id number]  HPI Pt is here for regular wellness examination, and is feeling pretty well in general, and says chronic med probs are stable, except as noted below Past Medical History  Diagnosis Date  . RENAL INSUFFICIENCY 05/22/2008  . SHOULDER PAIN, RIGHT 05/08/2008  . VENTRICULAR HYPERTROPHY, LEFT 05/08/2008  . DYSLIPIDEMIA 03/05/2007  . HERNIA 03/05/2007  . HYPERTENSION 03/05/2007  . 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 Diabetes mellitus without complication ([place])   . S/P radiation therapy 04/14/2014 through 06/11/2014                                                       Prostate 7800 cGy in 40 sessions, seminal vesicles 5600 cGy in 40 sessions                          Past Surgical History  Procedure Laterality Date  . Foot surgery  1999    Right Foot Bunion Repair  . Lower arterial  05/06/2003  . Prostate biopsy  07/23/13    gleason 7, volume 53 ml  . Remove fatty tumor Left 1995    thigh    Social History   Social History  . Marital Status: Married    Spouse Name: N/A  . Number of Children: N/A  . Years of Education: N/A   Occupational History  .      Retired   Social History Main Topics  . Smoking status: Former Smoker -- 0.50 packs/day for 20 years    Quit date: 06/20/2006  . Smokeless tobacco: Never Used  . Alcohol Use: No  . Drug Use: No  . Sexual Activity: Not on file   Other Topics Concern  . Not on file   Social History Narrative    Current Outpatient Prescriptions on File Prior to Visit  Medication Sig Dispense Refill  . Alcohol Swabs (ALCOHOL PREP PAD) 70 % PADS Use daily. 400 each 1  . atorvastatin (LIPITOR) 40 MG tablet Take 1 tablet (40 mg total) by mouth daily. 90 tablet 0  . bd getting started take home kit MISC 1 kit by Other route once. 1 kit 0  . Blood Glucose Calibration (TRUE METRIX LEVEL 1) Low SOLN Use as directed. 3 each  1  . Blood Glucose Monitoring Suppl (TRUE METRIX AIR GLUCOSE METER) DEVI 2 each by Does not apply route daily. 2 Device 2  . glucose blood (TRUE METRIX BLOOD GLUCOSE TEST) test strip Use to check blood sugar 2 times per day. 200 each 2  . hydrALAZINE (APRESOLINE) 10 MG tablet Take 1 tablet (10 mg total) by mouth 3 (three) times daily. 270 tablet 0  . insulin regular (HUMULIN R) 100 units/mL injection Inject 0.15 mLs (15 Units total) into the skin 2 (two) times daily before a meal. 20 mL 11  . Insulin Syringe-Needle U-100 (INSULIN SYRINGE .5CC/31GX5/16") 31G X 5/16" 0.5 ML MISC 1 Device by Does not apply route 4 (four) times daily. 120 each 11  . TRUEPLUS LANCETS 30G MISC Use to check blood sugar 2 times per day. 200 each 2   No current facility-administered medications on file prior  to visit.    Allergies  Allergen Reactions  . Zestril [Lisinopril]     angioedema    Family History  Problem Relation Age of Onset  . Hypertension Neg Hx   . Colon cancer Neg Hx   . Cancer Father 109    prostate  . Kidney Stones Brother   . Diabetes Sister     BP 136/86 mmHg  Pulse 80  Temp(Src) 98.1 F (36.7 C) (Oral)  Ht _0  (1.702 m)  Wt 217 lb (98.431 kg)  BMI 33.98 kg/m2  SpO2 96%   Review of Systems  Constitutional: Negative for fever.  HENT: Negative for hearing loss.   Eyes: Negative for visual disturbance.  Respiratory: Negative for shortness of breath.   Cardiovascular: Negative for leg swelling.  Gastrointestinal: Negative for anal bleeding.  Endocrine: Negative for cold intolerance.  Genitourinary: Negative for hematuria and difficulty urinating.  Musculoskeletal: Negative for back pain.  Skin: Negative for rash.  Allergic/Immunologic: Negative for environmental allergies.  Neurological: Negative for syncope.  Hematological: Does not bruise/bleed easily.  Psychiatric/Behavioral: Negative for dysphoric mood.       Objective:   Physical Exam VS: see vs page GEN: no  distress HEAD: head: no deformity eyes: no periorbital swelling, no proptosis external nose and ears are normal mouth: no lesion seen NECK: supple, thyroid is not enlarged CHEST WALL: no deformity LUNGS: clear to auscultation BREASTS:  No gynecomastia CV: reg rate and rhythm, no murmur ABD: abdomen is soft, nontender.  no hepatosplenomegaly.  not distended.  no hernia GENITALIA/RECTAL/PROSTATE: sees urology MUSCULOSKELETAL: muscle bulk and strength are grossly normal.  no obvious joint swelling.  gait is normal and steady PULSES: no carotid bruit NEURO:  cn 2-12 grossly intact.   readily moves all 4's.  SKIN:  Normal texture and temperature.  No rash or suspicious lesion is visible.   NODES:  None palpable at the neck PSYCH: alert, well-oriented.  Does not appear anxious nor depressed.        Assessment & Plan:  Wellness visit today, with problems stable, except as noted.   SEPARATE EVALUATION FOLLOWS--EACH PROBLEM HERE IS NEW, NOT RESPONDING TO TREATMENT, OR POSES SIGNIFICANT RISK TO THE PATIENT'S HEALTH: HISTORY OF THE PRESENT ILLNESS: Pt returns for f/u of diabetes mellitus: DM type: 1 Dx'ed: 3570 Complications: none.   Therapy: insulin since dx DKA: only at dx.   Severe hypoglycemia: never. Pancreatitis: never.   Other:  he takes human insulin, due to cost; he takes multiple daily injections.   Interval history: no cbg record, but states cbg's are highest in am (mid-100's).  He has hypoglycemia approx once per week.  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  PAST MEDICAL HISTORY reviewed and up to date today REVIEW OF SYSTEMS: Denies chest pain and numbness PHYSICAL EXAMINATION: VITAL SIGNS:  See vs page GENERAL: no distress Pulses: dorsalis pedis intact bilat.   MSK: no deformity of the feet CV: no leg edema Skin:  no ulcer on the feet.  normal color and temp on the feet. Neuro: sensation is intact to touch on the feet LAB/XRAY RESULTS: A1c=8.1% i  personally reviewed electrocardiogram tracing (today): Indication: DM Impression: Negative T-waves  -Possible  Anterior  ischemia.  IMPRESSION:  abnormal ecg recurrent DM: worse PLAN: ref cardiol. Please increase the NPH to 45 units at bedtime.

## 2015-11-03 LAB — VITAMIN D 25 HYDROXY (VIT D DEFICIENCY, FRACTURES): VITD: 26.75 ng/mL — ABNORMAL LOW (ref 30.00–100.00)

## 2015-11-03 LAB — PTH, INTACT AND CALCIUM
CALCIUM: 9.7 mg/dL (ref 8.4–10.5)
PTH: 107 pg/mL — AB (ref 14–64)

## 2015-11-10 DIAGNOSIS — C61 Malignant neoplasm of prostate: Secondary | ICD-10-CM | POA: Diagnosis not present

## 2015-11-10 DIAGNOSIS — N281 Cyst of kidney, acquired: Secondary | ICD-10-CM | POA: Diagnosis not present

## 2015-11-10 DIAGNOSIS — R350 Frequency of micturition: Secondary | ICD-10-CM | POA: Diagnosis not present

## 2015-11-10 DIAGNOSIS — N189 Chronic kidney disease, unspecified: Secondary | ICD-10-CM | POA: Diagnosis not present

## 2015-11-10 DIAGNOSIS — Z0000 Encounter for general adult medical examination without abnormal findings: Secondary | ICD-10-CM | POA: Diagnosis not present

## 2015-11-19 ENCOUNTER — Other Ambulatory Visit: Payer: Self-pay | Admitting: Endocrinology

## 2015-11-29 NOTE — Progress Notes (Signed)
Patient ID: John Fry, male   DOB: 18-Apr-1941, 75 y.o.   MRN: [id number]     Cardiology Office Note   Date:  11/30/2015   ID:  John Fry, DOB 1941/01/20, MRN [id number]  PCP:  Renato Shin, MD  Cardiologist:   Jenkins Rouge, MD   Chief Complaint  Patient presents with  . Establish Care    no sx      History of Present Illness: John Fry is a 75 y.o. male who presents for evaluation of abnormal ECG. 12/05/11 had normal ETT done by PA Richardson Dopp.  Exercised 5 minutes total 7 METS.  Echo 2012 reviewed mild LVH but septum measured 15 mm EF 60-65% Grade one diastolic dysfunction. Quit smoking in 2008.  DM with poor control Lantus increased recently A1c 8.1.  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 Some exertional dyspnea no chest pain, palpitations or syncope. Just started on hydralazine 3 weeks ago for better BP control Obese and seems to have poor insight into low carb diet     Past Medical History  Diagnosis Date  . RENAL INSUFFICIENCY 05/22/2008  . SHOULDER PAIN, RIGHT 05/08/2008  . VENTRICULAR HYPERTROPHY, LEFT 05/08/2008  . DYSLIPIDEMIA 03/05/2007  . HERNIA 03/05/2007  . HYPERTENSION 03/05/2007  . 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 Diabetes mellitus without complication ([place])   . S/P radiation therapy 04/14/2014 through 06/11/2014                                                       Prostate 7800 cGy in 40 sessions, seminal vesicles 5600 cGy in 40 sessions                          Past Surgical History  Procedure Laterality Date  . Foot surgery  1999    Right Foot Bunion Repair  . Lower arterial  05/06/2003  . Prostate biopsy  07/23/13    gleason 7, volume 53 ml  . Remove fatty tumor Left 1995    thigh     Current Outpatient Prescriptions  Medication Sig Dispense Refill  . atorvastatin (LIPITOR) 40 MG tablet Take 40 mg by mouth daily.    .  hydrALAZINE (APRESOLINE) 10 MG tablet Take 1 tablet (10 mg total) by mouth 3 (three) times daily. 270 tablet 0  . insulin NPH Human (NOVOLIN N) 100 UNIT/ML injection Inject 0.45 mLs (45 Units total) into the skin at bedtime. 20 mL 11  . insulin regular (HUMULIN R) 100 units/mL injection Inject 0.15 mLs (15 Units total) into the skin 2 (two) times daily before a meal. 20 mL 11   No current facility-administered medications for this visit.    Allergies:   Zestril    Social History:  The patient  reports that he quit smoking about 9 years ago. He has never used smokeless tobacco. He reports that he does not drink alcohol or use illicit drugs.   Family History:  The patient's family history includes Cancer (age of onset: 67) in his father; Diabetes in his sister; Kidney Stones in his brother. There is no history of Hypertension or Colon cancer.    ROS:  Please  see the history of present illness.   Otherwise, review of systems are positive for none.   All other systems are reviewed and negative.    PHYSICAL EXAM: VS:  BP 150/90 mmHg  Pulse 58  Ht 5\' 8"  (1.727 m)  Wt 99.846 kg (220 lb 1.9 oz)  BMI 33.48 kg/m2  SpO2 96% , BMI Body mass index is 33.48 kg/(m^2). Affect appropriate Over weight black male  HEENT: normal Neck supple with no adenopathy JVP normal no bruits no thyromegaly Lungs clear with no wheezing and good diaphragmatic motion Heart:  S1/S2 no murmur, no rub, gallop or click PMI normal Abdomen: benighn, BS positve, no tenderness, no AAA no bruit.  No HSM or HJR Distal pulses intact with no bruits No edema Neuro non-focal Skin warm and dry No muscular weakness    EKG:  SR LVH QRS/T angle wide V12   Recent Labs: 11/02/2015: ALT 28; BUN 24*; Creatinine, Ser 1.55*; Hemoglobin 15.5; Platelets 229.0; Potassium 4.3; Sodium 140; TSH 1.33    Lipid Panel    Component Value Date/Time   CHOL 114 11/02/2015 1208   TRIG 131.0 11/02/2015 1208   HDL 32.30* 11/02/2015 1208     CHOLHDL 4 11/02/2015 1208   VLDL 26.2 11/02/2015 1208   LDLCALC 56 11/02/2015 1208   LDLDIRECT 121.7 04/03/2013 1613      Wt Readings from Last 3 Encounters:  11/30/15 99.846 kg (220 lb 1.9 oz)  11/02/15 98.431 kg (217 lb)  09/30/15 100.245 kg (221 lb)      Other studies Reviewed: Additional studies/ records that were reviewed today include: Echo 2016 notes Dr Loanne Drilling and labs .    ASSESSMENT AND PLAN:  1.  Abnormal ECG:  Chronic related to LVH documented on echo.   Needs myovue given poorly controlled DM Also will check echo for EF and LV mass in setting of LVH 2. DM:  Discussed low carb diet.  Target hemoglobin A1c is 6.5 or less.  Continue current medications. 3. HTN:. Well controlled.  May need to increase hyralazine to 25 q8 fu Dr Loanne Drilling   4. Chol: on statin  Lab Results  Component Value Date   LDLCALC 56 11/02/2015      Current medicines are reviewed at length with the patient today.  The patient does not have concerns regarding medicines.  The following changes have been made:  no change  Labs/ tests ordered today include: Echo and Ex Myovue   No orders of the defined types were placed in this encounter.     Disposition:   FU with me in a year      Signed, Jenkins Rouge, MD  11/30/2015 10:59 AM    [facility] [address], [place], [place]  [address] Phone: [phone]; Fax: [phone]

## 2015-11-30 ENCOUNTER — Encounter: Payer: Self-pay | Admitting: Cardiovascular Disease

## 2015-11-30 ENCOUNTER — Ambulatory Visit (INDEPENDENT_AMBULATORY_CARE_PROVIDER_SITE_OTHER): Payer: Commercial Managed Care - HMO | Admitting: Cardiovascular Disease

## 2015-11-30 VITALS — BP 150/90 | HR 58 | Ht 68.0 in | Wt 220.1 lb

## 2015-11-30 DIAGNOSIS — Z7189 Other specified counseling: Secondary | ICD-10-CM

## 2015-11-30 DIAGNOSIS — R0600 Dyspnea, unspecified: Secondary | ICD-10-CM

## 2015-11-30 DIAGNOSIS — Z7689 Persons encountering health services in other specified circumstances: Secondary | ICD-10-CM

## 2015-11-30 NOTE — Patient Instructions (Signed)
Medication Instructions:  Your physician recommends that you continue on your current medications as directed. Please refer to the Current Medication list given to you today.  Labwork: NONE  Testing/Procedures: Your physician has requested that you have en exercise stress myoview. For further information please visit [email or web address]. Please follow instruction sheet, as given.  Your physician has requested that you have an echocardiogram. Echocardiography is a painless test that uses sound waves to create images of your heart. It provides your doctor with information about the size and shape of your heart and how well your heart's chambers and valves are working. This procedure takes approximately one hour. There are no restrictions for this procedure.  Follow-Up: Your physician wants you to follow-up in: 6 months with Dr. Johnsie Cancel. You will receive a reminder letter in the mail two months in advance. If you don't receive a letter, please call our office to schedule the follow-up appointment.   If you need a refill on your cardiac medications before your next appointment, please call your pharmacy.

## 2015-12-17 ENCOUNTER — Other Ambulatory Visit (HOSPITAL_COMMUNITY): Payer: Commercial Managed Care - HMO

## 2015-12-17 ENCOUNTER — Encounter (HOSPITAL_COMMUNITY): Payer: Commercial Managed Care - HMO

## 2015-12-21 ENCOUNTER — Telehealth (HOSPITAL_COMMUNITY): Payer: Self-pay | Admitting: *Deleted

## 2015-12-21 NOTE — Telephone Encounter (Signed)
Patient's wife given detailed instructions per Myocardial Perfusion Study Information Sheet for the test on 12/25/15 at [facility]. Patient notified to arrive 15 minutes early and that it is imperative to arrive on time for appointment to keep from having the test rescheduled.  If you need to cancel or reschedule your appointment, please call the office within 24 hours of your appointment. Failure to do so may result in a cancellation of your appointment, and a $50 no show fee. Patient verbalized understanding.Misk Galentine, [name]

## 2015-12-25 ENCOUNTER — Encounter (HOSPITAL_COMMUNITY): Payer: Commercial Managed Care - HMO

## 2015-12-25 ENCOUNTER — Other Ambulatory Visit (HOSPITAL_COMMUNITY): Payer: Commercial Managed Care - HMO

## 2016-01-04 ENCOUNTER — Telehealth (HOSPITAL_COMMUNITY): Payer: Self-pay | Admitting: *Deleted

## 2016-01-04 NOTE — Telephone Encounter (Signed)
Patient given detailed instructions per Myocardial Perfusion Study Information Sheet for the test on 01/07/16 at 1200. Patient notified to arrive 15 minutes early and that it is imperative to arrive on time for appointment to keep from having the test rescheduled.  If you need to cancel or reschedule your appointment, please call the office within 24 hours of your appointment. Failure to do so may result in a cancellation of your appointment, and a $50 no show fee. Patient verbalized understanding.John Fry, [name]

## 2016-01-07 ENCOUNTER — Ambulatory Visit (HOSPITAL_COMMUNITY): Payer: Commercial Managed Care - HMO | Attending: Cardiology

## 2016-01-07 ENCOUNTER — Other Ambulatory Visit: Payer: Self-pay

## 2016-01-07 ENCOUNTER — Ambulatory Visit (HOSPITAL_BASED_OUTPATIENT_CLINIC_OR_DEPARTMENT_OTHER): Payer: Commercial Managed Care - HMO

## 2016-01-07 DIAGNOSIS — E109 Type 1 diabetes mellitus without complications: Secondary | ICD-10-CM | POA: Diagnosis not present

## 2016-01-07 DIAGNOSIS — Z87891 Personal history of nicotine dependence: Secondary | ICD-10-CM | POA: Diagnosis not present

## 2016-01-07 DIAGNOSIS — I358 Other nonrheumatic aortic valve disorders: Secondary | ICD-10-CM | POA: Insufficient documentation

## 2016-01-07 DIAGNOSIS — I119 Hypertensive heart disease without heart failure: Secondary | ICD-10-CM | POA: Diagnosis not present

## 2016-01-07 DIAGNOSIS — R0600 Dyspnea, unspecified: Secondary | ICD-10-CM | POA: Diagnosis not present

## 2016-01-07 DIAGNOSIS — R9439 Abnormal result of other cardiovascular function study: Secondary | ICD-10-CM | POA: Diagnosis not present

## 2016-01-07 DIAGNOSIS — Z7689 Persons encountering health services in other specified circumstances: Secondary | ICD-10-CM

## 2016-01-07 DIAGNOSIS — Z7189 Other specified counseling: Secondary | ICD-10-CM

## 2016-01-07 DIAGNOSIS — E785 Hyperlipidemia, unspecified: Secondary | ICD-10-CM | POA: Diagnosis not present

## 2016-01-07 DIAGNOSIS — R0609 Other forms of dyspnea: Secondary | ICD-10-CM | POA: Diagnosis not present

## 2016-01-07 LAB — MYOCARDIAL PERFUSION IMAGING
CHL CUP NUCLEAR SDS: 0
CHL CUP NUCLEAR SSS: 5
LHR: 0.23
LV dias vol: 121 mL (ref 62–150)
LV sys vol: 63 mL
Peak HR: 80 {beats}/min
Rest HR: 56 {beats}/min
SRS: 5
TID: 1.11

## 2016-01-07 MED ORDER — TECHNETIUM TC 99M TETROFOSMIN IV KIT
32.9000 | PACK | Freq: Once | INTRAVENOUS | Status: AC | PRN
  Administered 2016-01-07: 32.9 via INTRAVENOUS
  Filled 2016-01-07: qty 33

## 2016-01-07 MED ORDER — TECHNETIUM TC 99M TETROFOSMIN IV KIT
10.7000 | PACK | Freq: Once | INTRAVENOUS | Status: AC | PRN
  Administered 2016-01-07: 11 via INTRAVENOUS
  Filled 2016-01-07: qty 11

## 2016-01-07 MED ORDER — REGADENOSON 0.4 MG/5ML IV SOLN
0.4000 mg | Freq: Once | INTRAVENOUS | Status: AC
  Administered 2016-01-07: 0.4 mg via INTRAVENOUS

## 2016-01-27 ENCOUNTER — Other Ambulatory Visit: Payer: Self-pay | Admitting: Endocrinology

## 2016-03-04 ENCOUNTER — Ambulatory Visit: Payer: Commercial Managed Care - HMO | Admitting: Endocrinology

## 2016-03-04 DIAGNOSIS — Z0289 Encounter for other administrative examinations: Secondary | ICD-10-CM

## 2016-03-22 ENCOUNTER — Encounter: Payer: Self-pay | Admitting: Cardiovascular Disease

## 2016-04-01 NOTE — Progress Notes (Signed)
Patient ID: John Fry, male   DOB: 1940-07-18, 75 y.o.   MRN: [id number]     Cardiology Office Note   Date:  04/05/2016   ID:  John Fry, DOB 1940/08/13, MRN [id number]  PCP:  Renato Shin, MD  Cardiologist:   Jenkins Rouge, MD   No chief complaint on file.     History of Present Illness: John Fry is a 75 y.o. male who presents for evaluation of abnormal ECG. 12/05/11 had normal ETT done by PA Richardson Dopp.  Exercised 5 minutes total 7 METS.  Echo 2012 reviewed mild LVH but septum measured 15 mm EF 60-65% Grade one diastolic dysfunction. Quit smoking in 2008.  DM with poor control Lantus increased recently A1c 8.1.  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 Some exertional dyspnea no chest pain, palpitations or syncope.  Started on hydralazine for better BP control Obese and seems to have poor insight into low carb diet   Myovue ordered last visit  01/07/16   Nuclear stress EF: 47%.  There was no ST segment deviation noted during stress.  Defect 1: There is a medium defect of moderate severity present in the basal inferior and mid inferior location.  This is a low risk study.  The left ventricular ejection fraction is mildly decreased (45-54%).    Low risk stress nuclear study with probable inferior thinning; no significant ischemia; EF 47 with global hyokinesis and mild LVE.  Echo done same day showed normal EF 55-60%  With AV sclerosis   No cardiac complaints.  Moving lawns but no exercise. Checks BS twice/day Seeing eye doctor At end of month   Past Medical History:  Diagnosis Date  . Diabetes mellitus without complication ([place])   . DYSLIPIDEMIA 03/05/2007  . HERNIA 03/05/2007  . HYPERTENSION 03/05/2007  . 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 RENAL INSUFFICIENCY 05/22/2008  . S/P radiation therapy 04/14/2014 through 06/11/2014                                                       Prostate 7800 cGy in 40 sessions, seminal vesicles 5600 cGy in 40 sessions                        . SHOULDER PAIN, RIGHT 05/08/2008  . 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 FOOT SURGERY  1999   Right Foot Bunion Repair  . Lower Arterial  05/06/2003  . PROSTATE BIOPSY  07/23/13   gleason 7, volume 53 ml  . remove fatty tumor Left 1995   thigh     Current Outpatient Prescriptions  Medication Sig Dispense Refill  . atorvastatin (LIPITOR) 40 MG tablet TAKE 1 TABLET EVERY DAY 90 tablet 0  . hydrALAZINE (APRESOLINE) 10 MG tablet Take 1 tablet (10 mg total) by mouth 3 (three) times daily. 270 tablet 0  . insulin NPH Human (NOVOLIN N) 100 UNIT/ML injection Inject 0.45 mLs (45 Units total) into the skin at bedtime. 20 mL 11  . insulin regular (HUMULIN R) 100 units/mL injection Inject 0.15 mLs (15 Units total) into the skin 2 (two) times daily before a meal.  20 mL 11   No current facility-administered medications for this visit.     Allergies:   Zestril [lisinopril]    Social History:  The patient  reports that he quit smoking about 9 years ago. He has a 10.00 pack-year smoking history. He has never used smokeless tobacco. He reports that he does not drink alcohol or use drugs.   Family History:  The patient's family history includes Cancer (age of onset: 73) in his father; Diabetes in his sister; Kidney Stones in his brother.    ROS:  Please see the history of present illness.   Otherwise, review of systems are positive for none.   All other systems are reviewed and negative.    PHYSICAL EXAM: VS:  BP 130/62   Pulse 68   Ht 5\' 8"  (1.727 m)   Wt 99.3 kg (219 lb)   BMI 33.30 kg/m  , BMI Body mass index is 33.3 kg/m. Affect appropriate Over weight black male  HEENT: normal Neck supple with no adenopathy JVP normal no bruits no thyromegaly Lungs clear with no wheezing and good diaphragmatic  motion Heart:  S1/S2 no murmur, no rub, gallop or click PMI normal Abdomen: benighn, BS positve, no tenderness, no AAA no bruit.  No HSM or HJR Distal pulses intact with no bruits No edema Neuro non-focal Skin warm and dry No muscular weakness    EKG:  SR LVH QRS/T angle wide V12   Recent Labs: 11/02/2015: ALT 28; BUN 24; Creatinine, Ser 1.55; Hemoglobin 15.5; Platelets 229.0; Potassium 4.3; Sodium 140; TSH 1.33    Lipid Panel    Component Value Date/Time   CHOL 114 11/02/2015 1208   TRIG 131.0 11/02/2015 1208   HDL 32.30 (L) 11/02/2015 1208   CHOLHDL 4 11/02/2015 1208   VLDL 26.2 11/02/2015 1208   LDLCALC 56 11/02/2015 1208   LDLDIRECT 121.7 04/03/2013 1613      Wt Readings from Last 3 Encounters:  04/05/16 99.3 kg (219 lb)  11/30/15 99.8 kg (220 lb 1.9 oz)  11/02/15 98.4 kg (217 lb)      Other studies Reviewed: Additional studies/ records that were reviewed today include: Echo 2016 notes Dr Loanne Drilling and labs .    ASSESSMENT AND PLAN:  1.  Abnormal ECG:  Chronic related to LVH documented on echo EF normal by echo myovue non ischemic 2. DM:  Discussed low carb diet.  Target hemoglobin A1c is 6.5 or less.  Continue current medications. 3. HTN:. Borderline controlled.  May need to increase hyralazine to 25 q8 fu Dr Loanne Drilling   4. Chol: on statin  Lab Results  Component Value Date   LDLCALC 56 11/02/2015      Current medicines are reviewed at length with the patient today.  The patient does not have concerns regarding medicines.  The following changes have been made:  no change  Labs/ tests ordered today include:    No orders of the defined types were placed in this encounter.    Disposition:   FU with me in a year      Signed, Jenkins Rouge, MD  04/05/2016 8:59 AM    [facility] [address], [place], [place]  [address] Phone: [phone]; Fax: [phone]

## 2016-04-05 ENCOUNTER — Ambulatory Visit (INDEPENDENT_AMBULATORY_CARE_PROVIDER_SITE_OTHER): Payer: Commercial Managed Care - HMO | Admitting: Cardiovascular Disease

## 2016-04-05 ENCOUNTER — Encounter: Payer: Self-pay | Admitting: Cardiovascular Disease

## 2016-04-05 VITALS — BP 130/62 | HR 68 | Ht 68.0 in | Wt 219.0 lb

## 2016-04-05 DIAGNOSIS — R9431 Abnormal electrocardiogram [ECG] [EKG]: Secondary | ICD-10-CM

## 2016-04-05 NOTE — Patient Instructions (Addendum)

## 2016-06-29 ENCOUNTER — Other Ambulatory Visit: Payer: Self-pay | Admitting: Endocrinology

## 2016-06-30 NOTE — Telephone Encounter (Signed)
Please refill x 1 Ov is due  

## 2016-08-15 ENCOUNTER — Other Ambulatory Visit: Payer: Self-pay | Admitting: Endocrinology

## 2016-09-05 ENCOUNTER — Other Ambulatory Visit: Payer: Self-pay | Admitting: Endocrinology

## 2016-09-05 NOTE — Telephone Encounter (Signed)
Please refill x 1 Ov is due  

## 2016-09-07 ENCOUNTER — Other Ambulatory Visit: Payer: Self-pay | Admitting: Endocrinology

## 2016-10-19 ENCOUNTER — Other Ambulatory Visit: Payer: Self-pay | Admitting: Endocrinology

## 2016-11-03 DIAGNOSIS — C61 Malignant neoplasm of prostate: Secondary | ICD-10-CM | POA: Diagnosis not present

## 2016-11-10 ENCOUNTER — Other Ambulatory Visit: Payer: Self-pay | Admitting: Endocrinology

## 2016-11-10 DIAGNOSIS — R350 Frequency of micturition: Secondary | ICD-10-CM | POA: Diagnosis not present

## 2016-11-10 DIAGNOSIS — N5201 Erectile dysfunction due to arterial insufficiency: Secondary | ICD-10-CM | POA: Diagnosis not present

## 2016-11-10 DIAGNOSIS — N281 Cyst of kidney, acquired: Secondary | ICD-10-CM | POA: Diagnosis not present

## 2016-11-10 DIAGNOSIS — C61 Malignant neoplasm of prostate: Secondary | ICD-10-CM | POA: Diagnosis not present

## 2016-11-10 NOTE — Telephone Encounter (Signed)
Please refill x 3 mos Ov is due 

## 2016-11-15 ENCOUNTER — Other Ambulatory Visit: Payer: Self-pay | Admitting: Endocrinology

## 2016-11-23 ENCOUNTER — Other Ambulatory Visit: Payer: Self-pay | Admitting: Endocrinology

## 2016-11-23 NOTE — Telephone Encounter (Signed)
Okay to refill??  No showed last appointment, no other appointments since 2017.  Thank you!

## 2016-11-23 NOTE — Telephone Encounter (Signed)
Please refill x 3 mos Ov is due 

## 2017-01-16 ENCOUNTER — Other Ambulatory Visit: Payer: Self-pay | Admitting: Endocrinology

## 2017-01-17 ENCOUNTER — Other Ambulatory Visit: Payer: Self-pay

## 2017-01-17 MED ORDER — BD SWAB SINGLE USE REGULAR PADS: MEDICATED_PAD | 0 refills | Status: AC

## 2017-01-17 MED ORDER — HYDROCHLOROTHIAZIDE 12.5 MG PO TABS: 12.5000 mg | ORAL_TABLET | Freq: Every day | ORAL | 0 refills | Status: DC

## 2017-03-15 ENCOUNTER — Encounter: Payer: Self-pay | Admitting: Endocrinology

## 2017-03-15 ENCOUNTER — Telehealth: Payer: Self-pay | Admitting: Endocrinology

## 2017-03-15 ENCOUNTER — Other Ambulatory Visit: Payer: Self-pay

## 2017-03-15 ENCOUNTER — Ambulatory Visit (INDEPENDENT_AMBULATORY_CARE_PROVIDER_SITE_OTHER): Payer: Commercial Managed Care - HMO | Admitting: Endocrinology

## 2017-03-15 VITALS — BP 170/98 | HR 97 | Wt 223.6 lb

## 2017-03-15 DIAGNOSIS — E213 Hyperparathyroidism, unspecified: Secondary | ICD-10-CM

## 2017-03-15 DIAGNOSIS — E559 Vitamin D deficiency, unspecified: Secondary | ICD-10-CM | POA: Diagnosis not present

## 2017-03-15 DIAGNOSIS — Z23 Encounter for immunization: Secondary | ICD-10-CM | POA: Diagnosis not present

## 2017-03-15 DIAGNOSIS — E1122 Type 2 diabetes mellitus with diabetic chronic kidney disease: Secondary | ICD-10-CM

## 2017-03-15 DIAGNOSIS — Z0000 Encounter for general adult medical examination without abnormal findings: Secondary | ICD-10-CM

## 2017-03-15 DIAGNOSIS — Z125 Encounter for screening for malignant neoplasm of prostate: Secondary | ICD-10-CM | POA: Diagnosis not present

## 2017-03-15 DIAGNOSIS — Z794 Long term (current) use of insulin: Secondary | ICD-10-CM | POA: Diagnosis not present

## 2017-03-15 LAB — URINALYSIS, ROUTINE W REFLEX MICROSCOPIC
BILIRUBIN URINE: NEGATIVE
Ketones, ur: NEGATIVE
LEUKOCYTES UA: NEGATIVE
NITRITE: NEGATIVE
Specific Gravity, Urine: 1.025 (ref 1.000–1.030)
Total Protein, Urine: NEGATIVE
Urine Glucose: NEGATIVE
Urobilinogen, UA: 0.2 (ref 0.0–1.0)
pH: 5.5 (ref 5.0–8.0)

## 2017-03-15 LAB — BASIC METABOLIC PANEL
BUN: 21 mg/dL (ref 6–23)
CALCIUM: 9.8 mg/dL (ref 8.4–10.5)
CHLORIDE: 102 meq/L (ref 96–112)
CO2: 31 mEq/L (ref 19–32)
CREATININE: 1.7 mg/dL — AB (ref 0.40–1.50)
GFR: 50.54 mL/min — ABNORMAL LOW (ref >60.00)
Glucose, Bld: 111 mg/dL — ABNORMAL HIGH (ref 70–99)
POTASSIUM: 4.2 meq/L (ref 3.5–5.1)
SODIUM: 139 meq/L (ref 135–145)

## 2017-03-15 LAB — HEPATIC FUNCTION PANEL
ALK PHOS: 66 U/L (ref 39–117)
ALT: 21 U/L (ref 0–53)
AST: 19 U/L (ref 0–37)
Albumin: 4.5 g/dL (ref 3.5–5.2)
BILIRUBIN DIRECT: 0.1 mg/dL (ref 0.0–0.3)
TOTAL PROTEIN: 6.7 g/dL (ref 6.0–8.3)
Total Bilirubin: 0.6 mg/dL (ref 0.2–1.2)

## 2017-03-15 LAB — CBC WITH DIFFERENTIAL/PLATELET
BASOS ABS: 0 10*3/uL (ref 0.0–0.1)
Basophils Relative: 0.6 % (ref 0.0–3.0)
EOS PCT: 2.9 % (ref 0.0–5.0)
Eosinophils Absolute: 0.2 10*3/uL (ref 0.0–0.7)
HEMATOCRIT: 44.4 % (ref 39.0–52.0)
Hemoglobin: 14.8 g/dL (ref 13.0–17.0)
LYMPHS ABS: 1.9 10*3/uL (ref 0.7–4.0)
LYMPHS PCT: 28.6 % (ref 12.0–46.0)
MCHC: 33.2 g/dL (ref 30.0–36.0)
MCV: 93.3 fl (ref 78.0–100.0)
MONOS PCT: 8.1 % (ref 3.0–12.0)
Monocytes Absolute: 0.5 10*3/uL (ref 0.1–1.0)
NEUTROS ABS: 4 10*3/uL (ref 1.4–7.7)
Neutrophils Relative %: 59.8 % (ref 43.0–77.0)
PLATELETS: 298 10*3/uL (ref 150.0–400.0)
RBC: 4.76 Mil/uL (ref 4.22–5.81)
RDW: 14.2 % (ref 11.5–15.5)
WBC: 6.8 10*3/uL (ref 4.0–10.5)

## 2017-03-15 LAB — LIPID PANEL
Cholesterol: 136 mg/dL (ref 0–200)
HDL: 38.3 mg/dL — AB (ref >39.00)
LDL Cholesterol: 64 mg/dL (ref 0–99)
NonHDL: 97.42
TRIGLYCERIDES: 168 mg/dL — AB (ref 0.0–149.0)
Total CHOL/HDL Ratio: 4
VLDL: 33.6 mg/dL (ref 0.0–40.0)

## 2017-03-15 LAB — PSA: PSA: 0.23 ng/mL (ref 0.10–4.00)

## 2017-03-15 LAB — POCT GLYCOSYLATED HEMOGLOBIN (HGB A1C): HEMOGLOBIN A1C: 8

## 2017-03-15 LAB — VITAMIN D 25 HYDROXY (VIT D DEFICIENCY, FRACTURES): VITD: 50.16 ng/mL (ref 30.00–100.00)

## 2017-03-15 LAB — MICROALBUMIN / CREATININE URINE RATIO
Creatinine,U: 167.8 mg/dL
MICROALB/CREAT RATIO: 0.9 mg/g (ref 0.0–30.0)
Microalb, Ur: 1.5 mg/dL (ref 0.0–1.9)

## 2017-03-15 LAB — TSH: TSH: 1.67 u[IU]/mL (ref 0.35–4.50)

## 2017-03-15 MED ORDER — HYDRALAZINE HCL 10 MG PO TABS: 10.0000 mg | ORAL_TABLET | Freq: Three times a day (TID) | ORAL | 0 refills | Status: DC

## 2017-03-15 MED ORDER — ISOSORBIDE DINITRATE 5 MG PO TABS: 5.0000 mg | ORAL_TABLET | Freq: Three times a day (TID) | ORAL | 11 refills | Status: DC

## 2017-03-15 MED ORDER — INSULIN REGULAR HUMAN 100 UNIT/ML IJ SOLN: 25.0000 [IU] | Freq: Two times a day (BID) | INTRAMUSCULAR | 11 refills | Status: DC

## 2017-03-15 MED ORDER — INSULIN NPH (HUMAN) (ISOPHANE) 100 UNIT/ML ~~LOC~~ SUSP: 40.0000 [IU] | Freq: Every day | SUBCUTANEOUS | 11 refills | Status: DC

## 2017-03-15 NOTE — Telephone Encounter (Signed)
rx says tid, but pt says he takes just 1/day.  He says he'll increase to tid.

## 2017-03-15 NOTE — Patient Instructions (Addendum)
Please increase the NPH to 40 units at bedtime, and:  Please continue the same regular insulin.  check your blood sugar twice a day.  vary the time of day when you check, between before the 3 meals, and at bedtime.  also check if you have symptoms of your blood sugar being too high or too low.  please keep a record of the readings and bring it to your next appointment here (or you can bring the meter itself).  You can write it on any piece of paper.  please call us sooner if your blood sugar goes below 70, or if you have a lot of readings over 200. Please consider these measures for your health:  minimize alcohol.  Do not use tobacco products.  Have a colonoscopy at least every 10 years from age 89.  Keep firearms safely stored.  Always use seat belts.  have working smoke alarms in your home.  See an eye doctor and dentist regularly.  Never drive under the influence of alcohol or drugs (including prescription drugs).   please let me know what your wishes would be, if artificial life support measures should become necessary.  It is critically important to prevent falling down (keep floor areas well-lit, dry, and free of loose objects.  If you have a cane, walker, or wheelchair, you should use it, even for short trips around the house.  Wear flat-soled shoes.  Also, try not to rush) I have sent a prescription to your pharmacy, to add another pill, to take along with the hydralazine Please come back for a regular physical appointment in 2 months.

## 2017-03-15 NOTE — Telephone Encounter (Signed)
Ok, please d/c isosorbide. Please advise pt to increase hydralazine to 3 times per day

## 2017-03-15 NOTE — Telephone Encounter (Signed)
Pharmacy called in reference to patient taking isosorbide dinitrate (ISORDIL) 5 MG tablet and  Sildenafil 20 mg together. Pharmacy stated these medications are not good together. Please call pharmacy and advise.

## 2017-03-15 NOTE — Progress Notes (Signed)
we discussed code status.  pt requests full code, but would not want to be started or maintained on artificial life-support measures if there was not a reasonable chance of recovery 

## 2017-03-15 NOTE — Telephone Encounter (Signed)
Called patient & notified him to take the hydralizine 3x daily.

## 2017-03-15 NOTE — Progress Notes (Signed)
Subjective:    Patient ID: John Fry, male    DOB: [birth year], 76 y.o.   MRN: [id number]  HPI Pt returns for f/u of diabetes mellitus: DM type: 1 Dx'ed: 0258 Complications: none.   Therapy: insulin since dx DKA: only at dx.   Severe hypoglycemia: never.  Pancreatitis: never.   Other:  he takes human insulin, due to cost; he takes multiple daily injections.   Interval history: no cbg record, but states cbg's are highest fasting, but he does not check at HS.  He takes reg, 25 units, twice a day (just before breakfast and supper), and NPH, 30 units qhs.  He takes hydralazine just qd Past Medical History:  Diagnosis Date  . Diabetes mellitus without complication ([place])   . DYSLIPIDEMIA 03/05/2007  . HERNIA 03/05/2007  . HYPERTENSION 03/05/2007  . 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 RENAL INSUFFICIENCY 05/22/2008  . S/P radiation therapy 04/14/2014 through 06/11/2014                                                      Prostate 7800 cGy in 40 sessions, seminal vesicles 5600 cGy in 40 sessions                        . SHOULDER PAIN, RIGHT 05/08/2008  . 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 FOOT SURGERY  1999   Right Foot Bunion Repair  . Lower Arterial  05/06/2003  . PROSTATE BIOPSY  07/23/13   gleason 7, volume 53 ml  . remove fatty tumor Left 1995   thigh    Social History   Social History  . Marital status: Married    Spouse name: N/A  . Number of children: N/A  . Years of education: N/A   Occupational History  .      Retired   Social History Main Topics  . Smoking status: Former Smoker    Packs/day: 0.50    Years: 20.00    Quit date: 06/20/2006  . Smokeless tobacco: Never Used  . Alcohol use No  . Drug use: No  . Sexual activity: Not on file   Other Topics Concern  . Not on file   Social History Narrative  . No narrative on file    Current Outpatient Prescriptions  on File Prior to Visit  Medication Sig Dispense Refill  . Alcohol Swabs (B-D SINGLE USE SWABS REGULAR) PADS USE  DAILY 200 each 0  . atorvastatin (LIPITOR) 40 MG tablet TAKE 1 TABLET EVERY DAY 90 tablet 0  . hydrochlorothiazide (HYDRODIURIL) 12.5 MG tablet Take 1 tablet (12.5 mg total) by mouth daily. 90 tablet 0  . TRUE METRIX BLOOD GLUCOSE TEST test strip CHECK BLOOD SUGAR TWICE DAILY  ( APPOINTMENT IS NEEDED  FOR  FURTHER  REFILLS) 2002 each 4  . TRUEPLUS LANCETS 30G MISC USE  TO CHECK BLOOD SUGAR TWICE DAILY 200 each 0   No current facility-administered medications on file prior to visit.     Allergies  Allergen Reactions  . Zestril [Lisinopril]     angioedema    Family History  Problem Relation Age of Onset  . Cancer Father 31       prostate  .  Kidney Stones Brother   . Diabetes Sister   . Hypertension Neg Hx   . Colon cancer Neg Hx     BP (!) 170/98   Pulse 97   Wt 223 lb 9.6 oz (101.4 kg)   SpO2 (!) 62%   BMI 34.00 kg/m   Review of Systems He denies hypoglycemia.     Objective:   Physical Exam VITAL SIGNS:  See vs page GENERAL: no distress Pulses: foot pulses are intact bilaterally.   MSK: no deformity of the feet or ankles.  CV: no edema of the legs or ankles Skin:  no ulcer on the feet or ankles.  normal color and temp on the feet and ankles Neuro: sensation is intact to touch on the feet and ankles.    Lab Results  Component Value Date   HGBA1C 8.0 03/15/2017   Lab Results  Component Value Date   CREATININE 1.70 (H) 03/15/2017   BUN 21 03/15/2017   NA 139 03/15/2017   K 4.2 03/15/2017   CL 102 03/15/2017   CO2 31 03/15/2017      Assessment & Plan:  Type 1 DM: he needs increased rx HTN: therapy limited by noncompliance.  i'll do the best i can. Renal insuff: worse: we'll follow Subjective:   Patient here for Medicare annual wellness visit and management of other chronic and acute problems.     Risk factors: advanced age    50 of  Physicians Providing Medical Care to Patient:  See "snapshot"   Activities of Daily Living: In your present state of health, do you have any difficulty performing the following activities (lives alone--widowed 2017):  Preparing food and eating?: No  Bathing yourself: No  Getting dressed: No  Using the toilet:No  Moving around from place to place: No  In the past year have you fallen or had a near fall?:No    Home Safety: Has smoke detector and wears seat belts. No firearms.   Diet and Exercise  Current exercise habits: pt says good Dietary issues discussed: pt reports a fairly healthy diet   Depression Screen  Q1: Over the past two weeks, have you felt down, depressed or hopeless? no  Q2: Over the past two weeks, have you felt little interest or pleasure in doing things? no   The following portions of the patient's history were reviewed and updated as appropriate: allergies, current medications, past family history, past medical history, past social history, past surgical history and problem list.   Review of Systems  Denies hearing loss, and visual loss Objective:   Vision:  Advertising account executive, so he declines VA today.  Hearing: grossly normal.  Body mass index:  See vs page.  Msk: pt easily and quickly performs "get-up-and-go" from a sitting position.  Cognitive Impairment Assessment: cognition, memory and judgment appear normal.  remembers 1/3 at 5 minutes (? effort).  excellent recall.  can easily read and write a sentence.  alert and oriented x 3.    Assessment:   Medicare wellness utd on preventive parameters.    Plan:   During the course of the visit the patient was educated and counseled about appropriate screening and preventive services including:        Fall prevention is offered   Diabetes care is performed Nutrition counseling is offered  Vaccines are updated as needed  Patient Instructions (the written plan) was given to the patient.

## 2017-03-15 NOTE — Telephone Encounter (Signed)
Called pharmacy to d/c isosorbide & change hydralazine to 3x daily. However, from the prescription it looks like he already takes hydralazine 3x daily?

## 2017-03-16 LAB — PTH, INTACT AND CALCIUM
CALCIUM: 9.5 mg/dL (ref 8.6–10.3)
PTH: 87 pg/mL — ABNORMAL HIGH (ref 14–64)

## 2017-03-17 ENCOUNTER — Other Ambulatory Visit: Payer: Self-pay | Admitting: Internal Medicine

## 2017-03-24 ENCOUNTER — Other Ambulatory Visit: Payer: Self-pay

## 2017-03-24 MED ORDER — INSULIN REGULAR HUMAN 100 UNIT/ML IJ SOLN: INTRAMUSCULAR | 11 refills | Status: DC

## 2017-05-17 ENCOUNTER — Encounter: Payer: Self-pay | Admitting: Endocrinology

## 2017-05-17 ENCOUNTER — Ambulatory Visit (INDEPENDENT_AMBULATORY_CARE_PROVIDER_SITE_OTHER): Payer: Medicare HMO | Admitting: Endocrinology

## 2017-05-17 VITALS — BP 170/104 | HR 63 | Wt 223.8 lb

## 2017-05-17 DIAGNOSIS — E162 Hypoglycemia, unspecified: Secondary | ICD-10-CM

## 2017-05-17 DIAGNOSIS — E1122 Type 2 diabetes mellitus with diabetic chronic kidney disease: Secondary | ICD-10-CM

## 2017-05-17 DIAGNOSIS — Z794 Long term (current) use of insulin: Secondary | ICD-10-CM | POA: Diagnosis not present

## 2017-05-17 DIAGNOSIS — I10 Essential (primary) hypertension: Secondary | ICD-10-CM

## 2017-05-17 LAB — POCT GLYCOSYLATED HEMOGLOBIN (HGB A1C): Hemoglobin A1C: 7.2

## 2017-05-17 MED ORDER — HYDRALAZINE HCL 25 MG PO TABS: 25.0000 mg | ORAL_TABLET | Freq: Three times a day (TID) | ORAL | 3 refills | Status: DC

## 2017-05-17 MED ORDER — HYDRALAZINE HCL 25 MG PO TABS: 25.0000 mg | ORAL_TABLET | Freq: Three times a day (TID) | ORAL | 11 refills | Status: DC

## 2017-05-17 NOTE — Progress Notes (Signed)
Subjective:    Patient ID: John Fry, male    DOB: Nov 12, 1940, 76 y.o.   MRN: [id number]  HPI Pt returns for f/u of diabetes mellitus: DM type: 1 Dx'ed: 0601 Complications: none.   Therapy: insulin since dx DKA: only at dx.   Severe hypoglycemia: never.  Pancreatitis: never.   Other:  he takes human insulin, due to cost; he takes multiple daily injections.  Interval history: no cbg record, but states cbg's are well-controlled.  He seldom has hypoglycemia, and these episodes are mild.  There is otherwise no trend throughout the day.   He seldom misses BP meds.  Past Medical History:  Diagnosis Date  . Diabetes mellitus without complication ([place])   . DYSLIPIDEMIA 03/05/2007  . HERNIA 03/05/2007  . HYPERTENSION 03/05/2007  . 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 RENAL INSUFFICIENCY 05/22/2008  . S/P radiation therapy 04/14/2014 through 06/11/2014                                                      Prostate 7800 cGy in 40 sessions, seminal vesicles 5600 cGy in 40 sessions                        . SHOULDER PAIN, RIGHT 05/08/2008  . 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 FOOT SURGERY  1999   Right Foot Bunion Repair  . Lower Arterial  05/06/2003  . PROSTATE BIOPSY  07/23/13   gleason 7, volume 53 ml  . remove fatty tumor Left 1995   thigh    Social History   Socioeconomic History  . Marital status: Married    Spouse name: Not on file  . Number of children: Not on file  . Years of education: Not on file  . Highest education level: Not on file  Social Needs  . Financial resource strain: Not on file  . Food insecurity - worry: Not on file  . Food insecurity - inability: Not on file  . Transportation needs - medical: Not on file  . Transportation needs - non-medical: Not on file  Occupational History    Comment: Retired  Tobacco Use  . Smoking status: Former Smoker    Packs/day:  0.50    Years: 20.00    Pack years: 10.00    Last attempt to quit: 06/20/2006    Years since quitting: 10.9  . Smokeless tobacco: Never Used  Substance and Sexual Activity  . Alcohol use: No    Alcohol/week: 0.0 oz  . Drug use: No  . Sexual activity: Not on file  Other Topics Concern  . Not on file  Social History Narrative  . Not on file    Current Outpatient Medications on File Prior to Visit  Medication Sig Dispense Refill  . Alcohol Swabs (B-D SINGLE USE SWABS REGULAR) PADS USE  DAILY 200 each 0  . atorvastatin (LIPITOR) 40 MG tablet TAKE 1 TABLET EVERY DAY (NEED MD APPOINTMENT) 90 tablet 0  . hydrochlorothiazide (HYDRODIURIL) 12.5 MG tablet Take 1 tablet (12.5 mg total) by mouth daily. 90 tablet 0  . insulin NPH Human (NOVOLIN N) 100 UNIT/ML injection Inject 0.4 mLs (40 Units total) into the skin at bedtime. 20 mL 11  .  insulin regular (HUMULIN R) 100 units/mL injection Inject 0.25 mLs (25 Units total) into the skin 2 (two) times daily before a meal. 20 mL 11  . insulin regular (NOVOLIN R RELION) 100 units/mL injection Inject 0.25 MLS (25 units total) into the skin two times daily before a meal. 10 mL 11  . isosorbide dinitrate (ISORDIL) 5 MG tablet Take 1 tablet (5 mg total) by mouth 3 (three) times daily. 90 tablet 11  . TRUE METRIX BLOOD GLUCOSE TEST test strip CHECK BLOOD SUGAR TWICE DAILY  ( APPOINTMENT IS NEEDED  FOR  FURTHER  REFILLS) 2002 each 4  . TRUEPLUS LANCETS 30G MISC USE  TO CHECK BLOOD SUGAR TWICE DAILY 200 each 0   No current facility-administered medications on file prior to visit.     Allergies  Allergen Reactions  . Zestril [Lisinopril]     angioedema    Family History  Problem Relation Age of Onset  . Cancer Father 21       prostate  . Kidney Stones Brother   . Diabetes Sister   . Hypertension Neg Hx   . Colon cancer Neg Hx     BP (!) 170/104 (BP Location: Left Arm, Patient Position: Sitting, Cuff Size: Normal)   Pulse 63   Wt 223 lb 12.8 oz  (101.5 kg)   SpO2 96%   BMI 34.03 kg/m   Review of Systems Denies LOC    Objective:   Physical Exam VITAL SIGNS:  See vs page GENERAL: no distress Pulses: dorsalis pedis intact bilat.   MSK: no deformity of the feet CV: no leg edema.  Skin:  no ulcer on the feet.  normal color and temp on the feet. Neuro: sensation is intact to touch on the feet.   Lab Results  Component Value Date   HGBA1C 7.2 05/17/2017       Assessment & Plan:  Insulin-requiring type 2 DM: well-controlled.  Hypoglycemia: this is limiting agressiveness of glycemic control.  HTN: he needs increased rx.    Patient Instructions  Please continue the same insulins.  I have sent a prescription to your pharmacy, to increase the hydralazine.  Please come back for a blood pressure recheck in 2 weeks.  check your blood sugar twice a day.  vary the time of day when you check, between before the 3 meals, and at bedtime.  also check if you have symptoms of your blood sugar being too high or too low.  please keep a record of the readings and bring it to your next appointment here (or you can bring the meter itself).  You can write it on any piece of paper.  please call us sooner if your blood sugar goes below 70, or if you have a lot of readings over 200.  I have sent a prescription to your pharmacy, to add another pill, to take along with the hydralazine.  Please come back for a regular physical appointment in 3 months.

## 2017-05-17 NOTE — Patient Instructions (Addendum)
Please continue the same insulins.  I have sent a prescription to your pharmacy, to increase the hydralazine.  Please come back for a blood pressure recheck in 2 weeks.  check your blood sugar twice a day.  vary the time of day when you check, between before the 3 meals, and at bedtime.  also check if you have symptoms of your blood sugar being too high or too low.  please keep a record of the readings and bring it to your next appointment here (or you can bring the meter itself).  You can write it on any piece of paper.  please call us sooner if your blood sugar goes below 70, or if you have a lot of readings over 200.  I have sent a prescription to your pharmacy, to add another pill, to take along with the hydralazine.  Please come back for a regular physical appointment in 3 months.

## 2017-05-23 ENCOUNTER — Other Ambulatory Visit: Payer: Self-pay | Admitting: Endocrinology

## 2017-05-31 ENCOUNTER — Encounter: Payer: Self-pay | Admitting: Endocrinology

## 2017-05-31 ENCOUNTER — Ambulatory Visit (INDEPENDENT_AMBULATORY_CARE_PROVIDER_SITE_OTHER): Payer: Medicare HMO | Admitting: Endocrinology

## 2017-05-31 VITALS — BP 216/118 | HR 70 | Wt 222.2 lb

## 2017-05-31 DIAGNOSIS — I10 Essential (primary) hypertension: Secondary | ICD-10-CM | POA: Diagnosis not present

## 2017-05-31 MED ORDER — AMLODIPINE BESYLATE 5 MG PO TABS: 5.0000 mg | ORAL_TABLET | Freq: Every day | ORAL | 3 refills | Status: DC

## 2017-05-31 NOTE — Progress Notes (Signed)
   Subjective:    Patient ID: John Fry, male    DOB: 14-May-1941, 76 y.o.   MRN: [id number]  HPI Pt returns for f/u of HTN.  rx is limited by intolerance to ACEI, and by renal failure.  pt states he feels well in general.  He says he sometimes misses his meds.    Review of Systems Denies sob    Objective:   Physical Exam VITAL SIGNS:  See vs page.  GENERAL: no distress.  LUNGS:  Clear to auscultation HEART:  Regular rate and rhythm without murmurs noted. Normal S1,S2.   Ext: no leg edema.  Skin: not diaphoretic.  Pulses: no carotid bruit PSYCH: Does not appear anxious.   Lab Results  Component Value Date   CREATININE 1.70 (H) 03/15/2017   BUN 21 03/15/2017   NA 139 03/15/2017   K 4.2 03/15/2017   CL 102 03/15/2017   CO2 31 03/15/2017      Assessment & Plan:  HTN: worse Noncompliance with cbg recording: I'll work around this as best I can.  We'll add a qd med.  We may need to change 2 other meds to bidil, despite the cost.  Renal insuff.  This limits rx options.    Patient Instructions  I have sent a prescription to your pharmacy, to add "amlodipine."  Please come back for a follow-up appointment in 1 week.

## 2017-05-31 NOTE — Patient Instructions (Addendum)
I have sent a prescription to your pharmacy, to add "amlodipine."  Please come back for a follow-up appointment in 1 week.

## 2017-06-06 ENCOUNTER — Ambulatory Visit (INDEPENDENT_AMBULATORY_CARE_PROVIDER_SITE_OTHER): Payer: Medicare HMO | Admitting: Endocrinology

## 2017-06-06 ENCOUNTER — Encounter: Payer: Self-pay | Admitting: Endocrinology

## 2017-06-06 VITALS — BP 198/100 | HR 73 | Ht 67.0 in | Wt 224.0 lb

## 2017-06-06 DIAGNOSIS — I10 Essential (primary) hypertension: Secondary | ICD-10-CM | POA: Diagnosis not present

## 2017-06-06 MED ORDER — ISOSORBIDE DINITRATE 10 MG PO TABS: 10.0000 mg | ORAL_TABLET | Freq: Three times a day (TID) | ORAL | 3 refills | Status: DC

## 2017-06-06 MED ORDER — HYDRALAZINE HCL 50 MG PO TABS: 50.0000 mg | ORAL_TABLET | Freq: Three times a day (TID) | ORAL | 3 refills | Status: DC

## 2017-06-06 NOTE — Progress Notes (Signed)
Subjective:    Patient ID: John Fry, male    DOB: [birth year], 76 y.o.   MRN: [id number]  HPI Pt returns for f/u of HTN.  rx is limited by intolerance to ACEI, and by renal failure.  pt states he feels well in general.  He says he never misses his meds.  Past Medical History:  Diagnosis Date  . Diabetes mellitus without complication ([place])   . DYSLIPIDEMIA 03/05/2007  . HERNIA 03/05/2007  . HYPERTENSION 03/05/2007  . 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 RENAL INSUFFICIENCY 05/22/2008  . S/P radiation therapy 04/14/2014 through 06/11/2014                                                      Prostate 7800 cGy in 40 sessions, seminal vesicles 5600 cGy in 40 sessions                        . SHOULDER PAIN, RIGHT 05/08/2008  . 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 FOOT SURGERY  1999   Right Foot Bunion Repair  . Lower Arterial  05/06/2003  . PROSTATE BIOPSY  07/23/13   gleason 7, volume 53 ml  . remove fatty tumor Left 1995   thigh    Social History   Socioeconomic History  . Marital status: Married    Spouse name: Not on file  . Number of children: Not on file  . Years of education: Not on file  . Highest education level: Not on file  Social Needs  . Financial resource strain: Not on file  . Food insecurity - worry: Not on file  . Food insecurity - inability: Not on file  . Transportation needs - medical: Not on file  . Transportation needs - non-medical: Not on file  Occupational History    Comment: Retired  Tobacco Use  . Smoking status: Former Smoker    Packs/day: 0.50    Years: 20.00    Pack years: 10.00    Last attempt to quit: 06/20/2006    Years since quitting: 10.9  . Smokeless tobacco: Never Used  Substance and Sexual Activity  . Alcohol use: No    Alcohol/week: 0.0 oz  . Drug use: No  . Sexual activity: Not on file  Other Topics Concern  . Not on file  Social  History Narrative  . Not on file    Current Outpatient Medications on File Prior to Visit  Medication Sig Dispense Refill  . Alcohol Swabs (B-D SINGLE USE SWABS REGULAR) PADS USE  DAILY 200 each 0  . amLODipine (NORVASC) 5 MG tablet Take 1 tablet (5 mg total) by mouth daily. 90 tablet 3  . atorvastatin (LIPITOR) 40 MG tablet TAKE 1 TABLET EVERY DAY (NEED MD APPOINTMENT) 90 tablet 0  . hydrochlorothiazide (HYDRODIURIL) 12.5 MG tablet Take 1 tablet (12.5 mg total) by mouth daily. 90 tablet 0  . insulin NPH Human (NOVOLIN N) 100 UNIT/ML injection Inject 0.4 mLs (40 Units total) into the skin at bedtime. 20 mL 11  . insulin regular (HUMULIN R) 100 units/mL injection Inject 0.25 mLs (25 Units total) into the skin 2 (two) times daily before a meal. 20 mL 11  . insulin regular (  NOVOLIN R RELION) 100 units/mL injection Inject 0.25 MLS (25 units total) into the skin two times daily before a meal. 10 mL 11  . TRUE METRIX BLOOD GLUCOSE TEST test strip CHECK BLOOD SUGAR TWICE DAILY  ( APPOINTMENT IS NEEDED  FOR  FURTHER  REFILLS) 2002 each 4  . TRUEPLUS LANCETS 30G MISC USE  TO CHECK BLOOD SUGAR TWICE DAILY 200 each 0   No current facility-administered medications on file prior to visit.     Allergies  Allergen Reactions  . Zestril [Lisinopril]     angioedema    Family History  Problem Relation Age of Onset  . Cancer Father 2       prostate  . Kidney Stones Brother   . Diabetes Sister   . Hypertension Neg Hx   . Colon cancer Neg Hx     BP (!) 198/100   Pulse 73   Ht 5\' 7"  (1.702 m)   Wt 224 lb (101.6 kg)   SpO2 96%   BMI 35.08 kg/m   Review of Systems Denies sob and headache.     Objective:   Physical Exam VITAL SIGNS:  See vs page GENERAL: no distress Ext: no leg edema  Lab Results  Component Value Date   CREATININE 1.70 (H) 03/15/2017   BUN 21 03/15/2017   NA 139 03/15/2017   K 4.2 03/15/2017   CL 102 03/15/2017   CO2 31 03/15/2017      Assessment & Plan:  HTN:  he needs increased rx   Patient Instructions  Here are prescriptions, to double 2 of your meds.  You can use up the current ones by taking 2 at a time.  Please come back for a follow-up appointment in 3 weeks.

## 2017-06-06 NOTE — Patient Instructions (Addendum)
Here are prescriptions, to double 2 of your meds.  You can use up the current ones by taking 2 at a time.  Please come back for a follow-up appointment in 3 weeks.

## 2017-07-03 ENCOUNTER — Encounter: Payer: Self-pay | Admitting: Endocrinology

## 2017-07-03 ENCOUNTER — Ambulatory Visit (INDEPENDENT_AMBULATORY_CARE_PROVIDER_SITE_OTHER): Payer: Medicare HMO | Admitting: Endocrinology

## 2017-07-03 VITALS — BP 165/84 | HR 75 | Wt 225.6 lb

## 2017-07-03 DIAGNOSIS — I10 Essential (primary) hypertension: Secondary | ICD-10-CM

## 2017-07-03 MED ORDER — ATORVASTATIN CALCIUM 40 MG PO TABS: 40.0000 mg | ORAL_TABLET | Freq: Every day | ORAL | 3 refills | Status: DC

## 2017-07-03 MED ORDER — ISOSORBIDE DINITRATE 20 MG PO TABS: 20.0000 mg | ORAL_TABLET | Freq: Three times a day (TID) | ORAL | 3 refills | Status: DC

## 2017-07-03 NOTE — Patient Instructions (Addendum)
I have sent a prescription to your pharmacy, to increase the isosorbide.  You can use up the current ones by taking 2 at a time.  Please come back for a follow-up appointment in 1 month.

## 2017-07-03 NOTE — Progress Notes (Signed)
Subjective:    Patient ID: John Fry, male    DOB: 1940-11-27, 77 y.o.   MRN: [id number]  HPI Pt returns for f/u of HTN.  rx is limited by intolerance to ACEI, low-normal HR, and by renal failure.  pt states he feels well in general.  He says he never misses his meds.  Past Medical History:  Diagnosis Date  . Diabetes mellitus without complication ([place])   . DYSLIPIDEMIA 03/05/2007  . HERNIA 03/05/2007  . HYPERTENSION 03/05/2007  . 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 RENAL INSUFFICIENCY 05/22/2008  . S/P radiation therapy 04/14/2014 through 06/11/2014                                                      Prostate 7800 cGy in 40 sessions, seminal vesicles 5600 cGy in 40 sessions                        . SHOULDER PAIN, RIGHT 05/08/2008  . 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 FOOT SURGERY  1999   Right Foot Bunion Repair  . Lower Arterial  05/06/2003  . PROSTATE BIOPSY  07/23/13   gleason 7, volume 53 ml  . remove fatty tumor Left 1995   thigh    Social History   Socioeconomic History  . Marital status: Married    Spouse name: Not on file  . Number of children: Not on file  . Years of education: Not on file  . Highest education level: Not on file  Social Needs  . Financial resource strain: Not on file  . Food insecurity - worry: Not on file  . Food insecurity - inability: Not on file  . Transportation needs - medical: Not on file  . Transportation needs - non-medical: Not on file  Occupational History    Comment: Retired  Tobacco Use  . Smoking status: Former Smoker    Packs/day: 0.50    Years: 20.00    Pack years: 10.00    Last attempt to quit: 06/20/2006    Years since quitting: 11.0  . Smokeless tobacco: Never Used  Substance and Sexual Activity  . Alcohol use: No    Alcohol/week: 0.0 oz  . Drug use: No  . Sexual activity: Not on file  Other Topics Concern  . Not on file   Social History Narrative  . Not on file    Current Outpatient Medications on File Prior to Visit  Medication Sig Dispense Refill  . Alcohol Swabs (B-D SINGLE USE SWABS REGULAR) PADS USE  DAILY 200 each 0  . amLODipine (NORVASC) 5 MG tablet Take 1 tablet (5 mg total) by mouth daily. 90 tablet 3  . hydrALAZINE (APRESOLINE) 50 MG tablet Take 1 tablet (50 mg total) by mouth 3 (three) times daily. 270 tablet 3  . hydrochlorothiazide (HYDRODIURIL) 12.5 MG tablet Take 1 tablet (12.5 mg total) by mouth daily. 90 tablet 0  . insulin NPH Human (NOVOLIN N) 100 UNIT/ML injection Inject 0.4 mLs (40 Units total) into the skin at bedtime. 20 mL 11  . insulin regular (HUMULIN R) 100 units/mL injection Inject 0.25 mLs (25 Units total) into the skin 2 (two) times daily before a meal. 20  mL 11  . insulin regular (NOVOLIN R RELION) 100 units/mL injection Inject 0.25 MLS (25 units total) into the skin two times daily before a meal. 10 mL 11  . TRUE METRIX BLOOD GLUCOSE TEST test strip CHECK BLOOD SUGAR TWICE DAILY  ( APPOINTMENT IS NEEDED  FOR  FURTHER  REFILLS) 2002 each 4  . TRUEPLUS LANCETS 30G MISC USE  TO CHECK BLOOD SUGAR TWICE DAILY 200 each 0   No current facility-administered medications on file prior to visit.     Allergies  Allergen Reactions  . Zestril [Lisinopril]     angioedema    Family History  Problem Relation Age of Onset  . Cancer Father 40       prostate  . Kidney Stones Brother   . Diabetes Sister   . Hypertension Neg Hx   . Colon cancer Neg Hx     BP (!) 165/84 (BP Location: Left Arm, Cuff Size: Normal)   Pulse 75   Wt 225 lb 9.6 oz (102.3 kg)   SpO2 97%   BMI 35.33 kg/m    Review of Systems Denies sob.     Objective:   Physical Exam VITAL SIGNS:  See vs page GENERAL: no distress Ext: no leg edema.        Assessment & Plan:  HTN: he needs increased rx.   Patient Instructions  I have sent a prescription to your pharmacy, to increase the isosorbide.  You can  use up the current ones by taking 2 at a time.  Please come back for a follow-up appointment in 1 month.

## 2017-07-24 ENCOUNTER — Other Ambulatory Visit: Payer: Self-pay | Admitting: Endocrinology

## 2017-08-02 ENCOUNTER — Telehealth: Payer: Self-pay

## 2017-08-02 NOTE — Telephone Encounter (Signed)
please call patient, and ask him how much he is taking, thanks.

## 2017-08-02 NOTE — Telephone Encounter (Signed)
I called patient but was unable to leave message because no VM was set up.

## 2017-08-02 NOTE — Telephone Encounter (Signed)
Received fax that pt stated he was taking 2 Amlodipine tablets daily, I do not see documented where that would be the case. Please advise

## 2017-08-03 DIAGNOSIS — H43813 Vitreous degeneration, bilateral: Secondary | ICD-10-CM | POA: Diagnosis not present

## 2017-08-03 DIAGNOSIS — E119 Type 2 diabetes mellitus without complications: Secondary | ICD-10-CM | POA: Diagnosis not present

## 2017-08-03 DIAGNOSIS — H35033 Hypertensive retinopathy, bilateral: Secondary | ICD-10-CM | POA: Diagnosis not present

## 2017-08-04 ENCOUNTER — Encounter: Payer: Self-pay | Admitting: Endocrinology

## 2017-08-04 ENCOUNTER — Ambulatory Visit (INDEPENDENT_AMBULATORY_CARE_PROVIDER_SITE_OTHER): Payer: Medicare HMO | Admitting: Endocrinology

## 2017-08-04 VITALS — BP 182/98 | HR 69 | Wt 224.2 lb

## 2017-08-04 DIAGNOSIS — E1122 Type 2 diabetes mellitus with diabetic chronic kidney disease: Secondary | ICD-10-CM

## 2017-08-04 DIAGNOSIS — Z794 Long term (current) use of insulin: Secondary | ICD-10-CM | POA: Diagnosis not present

## 2017-08-04 LAB — POCT GLYCOSYLATED HEMOGLOBIN (HGB A1C): HEMOGLOBIN A1C: 7.8

## 2017-08-04 MED ORDER — HYDROCHLOROTHIAZIDE 25 MG PO TABS: 25.0000 mg | ORAL_TABLET | Freq: Every day | ORAL | 3 refills | Status: DC

## 2017-08-04 NOTE — Progress Notes (Signed)
Subjective:    Patient ID: John Fry, male    DOB: Apr 19, 1941, 77 y.o.   MRN: [id number]  HPI Pt returns for f/u of diabetes mellitus: DM type: 1 Dx'ed: 8341 Complications: renal insuff  Therapy: insulin since dx DKA: only at dx.   Severe hypoglycemia: never.  Pancreatitis: never.   Other:  he takes human insulin, due to cost; he takes multiple daily injections.  Interval history: no cbg record, but states cbg's are well-controlled.  There is no trend throughout the day. Past Medical History:  Diagnosis Date  . Diabetes mellitus without complication ([place])   . DYSLIPIDEMIA 03/05/2007  . HERNIA 03/05/2007  . HYPERTENSION 03/05/2007  . 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 RENAL INSUFFICIENCY 05/22/2008  . S/P radiation therapy 04/14/2014 through 06/11/2014                                                      Prostate 7800 cGy in 40 sessions, seminal vesicles 5600 cGy in 40 sessions                        . SHOULDER PAIN, RIGHT 05/08/2008  . 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 FOOT SURGERY  1999   Right Foot Bunion Repair  . Lower Arterial  05/06/2003  . PROSTATE BIOPSY  07/23/13   gleason 7, volume 53 ml  . remove fatty tumor Left 1995   thigh    Social History   Socioeconomic History  . Marital status: Married    Spouse name: Not on file  . Number of children: Not on file  . Years of education: Not on file  . Highest education level: Not on file  Social Needs  . Financial resource strain: Not on file  . Food insecurity - worry: Not on file  . Food insecurity - inability: Not on file  . Transportation needs - medical: Not on file  . Transportation needs - non-medical: Not on file  Occupational History    Comment: Retired  Tobacco Use  . Smoking status: Former Smoker    Packs/day: 0.50    Years: 20.00    Pack years: 10.00    Last attempt to quit: 06/20/2006    Years since  quitting: 11.1  . Smokeless tobacco: Never Used  Substance and Sexual Activity  . Alcohol use: No    Alcohol/week: 0.0 oz  . Drug use: No  . Sexual activity: Not on file  Other Topics Concern  . Not on file  Social History Narrative  . Not on file    Current Outpatient Medications on File Prior to Visit  Medication Sig Dispense Refill  . Alcohol Swabs (B-D SINGLE USE SWABS REGULAR) PADS USE  DAILY 200 each 0  . amLODipine (NORVASC) 5 MG tablet Take 1 tablet (5 mg total) by mouth daily. 90 tablet 3  . atorvastatin (LIPITOR) 40 MG tablet TAKE 1 TABLET EVERY DAY (NEED MD APPOINTMENT) 90 tablet 0  . hydrALAZINE (APRESOLINE) 50 MG tablet Take 1 tablet (50 mg total) by mouth 3 (three) times daily. 270 tablet 3  . insulin NPH Human (NOVOLIN N) 100 UNIT/ML injection Inject 0.4 mLs (40 Units total) into the skin at bedtime.  20 mL 11  . insulin regular (HUMULIN R) 100 units/mL injection Inject 0.25 mLs (25 Units total) into the skin 2 (two) times daily before a meal. 20 mL 11  . insulin regular (NOVOLIN R RELION) 100 units/mL injection Inject 0.25 MLS (25 units total) into the skin two times daily before a meal. 10 mL 11  . isosorbide dinitrate (ISORDIL) 20 MG tablet Take 1 tablet (20 mg total) by mouth 3 (three) times daily. 270 tablet 3  . TRUE METRIX BLOOD GLUCOSE TEST test strip CHECK BLOOD SUGAR TWICE DAILY  ( APPOINTMENT IS NEEDED  FOR  FURTHER  REFILLS) 2002 each 4  . TRUEPLUS LANCETS 30G MISC USE  TO CHECK BLOOD SUGAR TWICE DAILY 200 each 0   No current facility-administered medications on file prior to visit.     Allergies  Allergen Reactions  . Zestril [Lisinopril]     angioedema    Family History  Problem Relation Age of Onset  . Cancer Father 13       prostate  . Kidney Stones Brother   . Diabetes Sister   . Hypertension Neg Hx   . Colon cancer Neg Hx     BP (!) 182/98 (BP Location: Left Arm, Patient Position: Sitting, Cuff Size: Normal)   Pulse 69   Wt 224 lb 3.2 oz  (101.7 kg)   SpO2 97%   BMI 35.11 kg/m    Review of Systems He denies hypoglycemia    Objective:   Physical Exam VITAL SIGNS:  See vs page GENERAL: no distress Pulses: dorsalis pedis intact bilat.   MSK: no deformity of the feet CV: trace bilat leg edema Skin:  no ulcer on the feet.  normal color and temp on the feet.  Neuro: sensation is intact to touch on the feet.  There is bilateral onychomycosis of the toenails.   Lab Results  Component Value Date   HGBA1C 7.8 08/04/2017   Lab Results  Component Value Date   CREATININE 1.70 (H) 03/15/2017   BUN 21 03/15/2017   NA 139 03/15/2017   K 4.2 03/15/2017   CL 102 03/15/2017   CO2 31 03/15/2017      Assessment & Plan:  HTN: worse Renal insuff: this limits HTN rx options.  Insulin-requiring type 2 DM: this is the best control this pt should aim for, given this regimen, which does match insulin to her changing needs throughout the day  Patient Instructions  I have sent a prescription to your pharmacy, to increase the HCTZ.  You can use up the current ones by taking 2 at a time.  Please continue the same other medications.  check your blood sugar twice a day.  vary the time of day when you check, between before the 3 meals, and at bedtime.  also check if you have symptoms of your blood sugar being too high or too low.  please keep a record of the readings and bring it to your next appointment here (or you can bring the meter itself).  You can write it on any piece of paper.  please call us sooner if your blood sugar goes below 70, or if you have a lot of readings over 200. Please come back for a follow-up appointment in 1 month.

## 2017-08-04 NOTE — Patient Instructions (Addendum)
I have sent a prescription to your pharmacy, to increase the HCTZ.  You can use up the current ones by taking 2 at a time.  Please continue the same other medications.  check your blood sugar twice a day.  vary the time of day when you check, between before the 3 meals, and at bedtime.  also check if you have symptoms of your blood sugar being too high or too low.  please keep a record of the readings and bring it to your next appointment here (or you can bring the meter itself).  You can write it on any piece of paper.  please call us sooner if your blood sugar goes below 70, or if you have a lot of readings over 200. Please come back for a follow-up appointment in 1 month.

## 2017-08-16 ENCOUNTER — Ambulatory Visit: Payer: Medicare HMO | Admitting: Endocrinology

## 2017-09-01 ENCOUNTER — Ambulatory Visit (INDEPENDENT_AMBULATORY_CARE_PROVIDER_SITE_OTHER): Payer: Medicare HMO | Admitting: Endocrinology

## 2017-09-01 ENCOUNTER — Encounter: Payer: Self-pay | Admitting: Endocrinology

## 2017-09-01 VITALS — BP 182/110 | HR 66 | Wt 224.6 lb

## 2017-09-01 DIAGNOSIS — I10 Essential (primary) hypertension: Secondary | ICD-10-CM | POA: Diagnosis not present

## 2017-09-01 MED ORDER — AMLODIPINE BESYLATE 10 MG PO TABS: 10.0000 mg | ORAL_TABLET | Freq: Every day | ORAL | 3 refills | Status: DC

## 2017-09-01 NOTE — Patient Instructions (Addendum)
I have sent a prescription to your pharmacy, to increase the amlodipine.  You can use up the current ones by taking 2 at a time.  check your blood sugar twice a day.  vary the time of day when you check, between before the 3 meals, and at bedtime.  also check if you have symptoms of your blood sugar being too high or too low.  please keep a record of the readings and bring it to your next appointment here (or you can bring the meter itself).  You can write it on any piece of paper.  please call us sooner if your blood sugar goes below 70, or if you have a lot of readings over 200.  Please come back for a follow-up appointment in 1 month.

## 2017-09-01 NOTE — Progress Notes (Signed)
Subjective:    Patient ID: John Fry, male    DOB: Feb 24, 1941, 77 y.o.   MRN: [id number]  HPI Pt returns for f/u of HTN.  he takes meds as rx'ed.  pt states he feels well in general.  Past Medical History:  Diagnosis Date  . Diabetes mellitus without complication ([place])   . DYSLIPIDEMIA 03/05/2007  . HERNIA 03/05/2007  . HYPERTENSION 03/05/2007  . 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 RENAL INSUFFICIENCY 05/22/2008  . S/P radiation therapy 04/14/2014 through 06/11/2014                                                      Prostate 7800 cGy in 40 sessions, seminal vesicles 5600 cGy in 40 sessions                        . SHOULDER PAIN, RIGHT 05/08/2008  . 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 FOOT SURGERY  1999   Right Foot Bunion Repair  . Lower Arterial  05/06/2003  . PROSTATE BIOPSY  07/23/13   gleason 7, volume 53 ml  . remove fatty tumor Left 1995   thigh    Social History   Socioeconomic History  . Marital status: Married    Spouse name: Not on file  . Number of children: Not on file  . Years of education: Not on file  . Highest education level: Not on file  Social Needs  . Financial resource strain: Not on file  . Food insecurity - worry: Not on file  . Food insecurity - inability: Not on file  . Transportation needs - medical: Not on file  . Transportation needs - non-medical: Not on file  Occupational History    Comment: Retired  Tobacco Use  . Smoking status: Former Smoker    Packs/day: 0.50    Years: 20.00    Pack years: 10.00    Last attempt to quit: 06/20/2006    Years since quitting: 11.2  . Smokeless tobacco: Never Used  Substance and Sexual Activity  . Alcohol use: No    Alcohol/week: 0.0 oz  . Drug use: No  . Sexual activity: Not on file  Other Topics Concern  . Not on file  Social History Narrative  . Not on file    Current Outpatient Medications on  File Prior to Visit  Medication Sig Dispense Refill  . Alcohol Swabs (B-D SINGLE USE SWABS REGULAR) PADS USE  DAILY 200 each 0  . atorvastatin (LIPITOR) 40 MG tablet TAKE 1 TABLET EVERY DAY (NEED MD APPOINTMENT) 90 tablet 0  . hydrALAZINE (APRESOLINE) 50 MG tablet Take 1 tablet (50 mg total) by mouth 3 (three) times daily. 270 tablet 3  . hydrochlorothiazide (HYDRODIURIL) 25 MG tablet Take 1 tablet (25 mg total) by mouth daily. 90 tablet 3  . insulin NPH Human (NOVOLIN N) 100 UNIT/ML injection Inject 0.4 mLs (40 Units total) into the skin at bedtime. 20 mL 11  . insulin regular (HUMULIN R) 100 units/mL injection Inject 0.25 mLs (25 Units total) into the skin 2 (two) times daily before a meal. 20 mL 11  . isosorbide dinitrate (ISORDIL) 20 MG tablet Take 1 tablet (20 mg total) by  mouth 3 (three) times daily. 270 tablet 3  . TRUE METRIX BLOOD GLUCOSE TEST test strip CHECK BLOOD SUGAR TWICE DAILY  ( APPOINTMENT IS NEEDED  FOR  FURTHER  REFILLS) 2002 each 4  . TRUEPLUS LANCETS 30G MISC USE  TO CHECK BLOOD SUGAR TWICE DAILY 200 each 0   No current facility-administered medications on file prior to visit.     Allergies  Allergen Reactions  . Zestril [Lisinopril]     angioedema    Family History  Problem Relation Age of Onset  . Cancer Father 32       prostate  . Kidney Stones Brother   . Diabetes Sister   . Hypertension Neg Hx   . Colon cancer Neg Hx     BP (!) 182/110 (BP Location: Left Arm, Patient Position: Sitting, Cuff Size: Normal)   Pulse 66   Wt 224 lb 9.6 oz (101.9 kg)   SpO2 95%   BMI 35.18 kg/m    Review of Systems Denies sob.       Objective:   Physical Exam VITAL SIGNS:  See vs page GENERAL: no distress Ext: no leg edema   Lab Results  Component Value Date   CREATININE 1.70 (H) 03/15/2017   BUN 21 03/15/2017   NA 139 03/15/2017   K 4.2 03/15/2017   CL 102 03/15/2017   CO2 31 03/15/2017       Assessment & Plan:  HTN: he needs increased rx Renal  insuff: this limits rx options.   Patient Instructions  I have sent a prescription to your pharmacy, to increase the amlodipine.  You can use up the current ones by taking 2 at a time.  check your blood sugar twice a day.  vary the time of day when you check, between before the 3 meals, and at bedtime.  also check if you have symptoms of your blood sugar being too high or too low.  please keep a record of the readings and bring it to your next appointment here (or you can bring the meter itself).  You can write it on any piece of paper.  please call us sooner if your blood sugar goes below 70, or if you have a lot of readings over 200.  Please come back for a follow-up appointment in 1 month.

## 2017-09-25 ENCOUNTER — Other Ambulatory Visit: Payer: Self-pay | Admitting: Endocrinology

## 2017-10-02 ENCOUNTER — Ambulatory Visit (INDEPENDENT_AMBULATORY_CARE_PROVIDER_SITE_OTHER): Payer: Medicare HMO | Admitting: Endocrinology

## 2017-10-02 ENCOUNTER — Encounter: Payer: Self-pay | Admitting: Endocrinology

## 2017-10-02 VITALS — BP 134/76 | HR 89 | Temp 99.3°F | Ht 67.0 in | Wt 224.0 lb

## 2017-10-02 DIAGNOSIS — E119 Type 2 diabetes mellitus without complications: Secondary | ICD-10-CM | POA: Diagnosis not present

## 2017-10-02 LAB — POCT GLYCOSYLATED HEMOGLOBIN (HGB A1C): Hemoglobin A1C: 8.3

## 2017-10-02 MED ORDER — INSULIN NPH (HUMAN) (ISOPHANE) 100 UNIT/ML ~~LOC~~ SUSP: 45.0000 [IU] | Freq: Every day | SUBCUTANEOUS | 11 refills | Status: DC

## 2017-10-02 NOTE — Patient Instructions (Addendum)
Please increase the NPH insulin to 45 units at bedtime, and:  Please continue the same medications for blood pressure.   check your blood sugar twice a day.  vary the time of day when you check, between before the 3 meals, and at bedtime.  also check if you have symptoms of your blood sugar being too high or too low.  please keep a record of the readings and bring it to your next appointment here (or you can bring the meter itself).  You can write it on any piece of paper.  please call us sooner if your blood sugar goes below 70, or if you have a lot of readings over 200.  Please come back for a follow-up appointment in 3 months.

## 2017-10-02 NOTE — Progress Notes (Signed)
Subjective:    Patient ID: John Fry, male    DOB: 05/15/1941, 77 y.o.   MRN: [id number]  HPI Pt returns for f/u of diabetes mellitus: DM type: 1 Dx'ed: 1660 Complications: renal insuff  Therapy: insulin since dx DKA: only at dx.   Severe hypoglycemia: never.  Pancreatitis: never.   Other:  he takes human insulin, due to cost; he takes multiple daily injections.  Interval history: no cbg record, but states cbg's vary from 90-140.  He says cbg is highest fasting (higher than at HS)  Pt says BP at home is 110/80.   Past Medical History:  Diagnosis Date  . Diabetes mellitus without complication ([place])   . DYSLIPIDEMIA 03/05/2007  . HERNIA 03/05/2007  . HYPERTENSION 03/05/2007  . 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 RENAL INSUFFICIENCY 05/22/2008  . S/P radiation therapy 04/14/2014 through 06/11/2014                                                      Prostate 7800 cGy in 40 sessions, seminal vesicles 5600 cGy in 40 sessions                        . SHOULDER PAIN, RIGHT 05/08/2008  . 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 FOOT SURGERY  1999   Right Foot Bunion Repair  . Lower Arterial  05/06/2003  . PROSTATE BIOPSY  07/23/13   gleason 7, volume 53 ml  . remove fatty tumor Left 1995   thigh    Social History   Socioeconomic History  . Marital status: Married    Spouse name: Not on file  . Number of children: Not on file  . Years of education: Not on file  . Highest education level: Not on file  Occupational History    Comment: Retired  Scientific laboratory technician  . Financial resource strain: Not on file  . Food insecurity:    Worry: Not on file    Inability: Not on file  . Transportation needs:    Medical: Not on file    Non-medical: Not on file  Tobacco Use  . Smoking status: Former Smoker    Packs/day: 0.50    Years: 20.00    Pack years: 10.00    Last attempt to quit: 06/20/2006   Years since quitting: 11.2  . Smokeless tobacco: Never Used  Substance and Sexual Activity  . Alcohol use: No    Alcohol/week: 0.0 oz  . Drug use: No  . Sexual activity: Not on file  Lifestyle  . Physical activity:    Days per week: Not on file    Minutes per session: Not on file  . Stress: Not on file  Relationships  . Social connections:    Talks on phone: Not on file    Gets together: Not on file    Attends religious service: Not on file    Active member of club or organization: Not on file    Attends meetings of clubs or organizations: Not on file    Relationship status: Not on file  . Intimate partner violence:    Fear of current or ex partner: Not on file    Emotionally abused: Not on  file    Physically abused: Not on file    Forced sexual activity: Not on file  Other Topics Concern  . Not on file  Social History Narrative  . Not on file    Current Outpatient Medications on File Prior to Visit  Medication Sig Dispense Refill  . Alcohol Swabs (B-D SINGLE USE SWABS REGULAR) PADS USE  DAILY 200 each 0  . amLODipine (NORVASC) 10 MG tablet Take 1 tablet (10 mg total) by mouth daily. 90 tablet 3  . atorvastatin (LIPITOR) 40 MG tablet TAKE 1 TABLET EVERY DAY (NEED MD APPOINTMENT) 90 tablet 0  . hydrALAZINE (APRESOLINE) 50 MG tablet Take 1 tablet (50 mg total) by mouth 3 (three) times daily. 270 tablet 3  . hydrochlorothiazide (HYDRODIURIL) 25 MG tablet Take 1 tablet (25 mg total) by mouth daily. 90 tablet 3  . insulin regular (HUMULIN R) 100 units/mL injection Inject 0.25 mLs (25 Units total) into the skin 2 (two) times daily before a meal. 20 mL 11  . isosorbide dinitrate (ISORDIL) 20 MG tablet Take 1 tablet (20 mg total) by mouth 3 (three) times daily. 270 tablet 3  . TRUE METRIX BLOOD GLUCOSE TEST test strip CHECK BLOOD SUGAR TWICE DAILY  ( APPOINTMENT IS NEEDED  FOR  FURTHER  REFILLS) 2002 each 4  . TRUEPLUS LANCETS 30G MISC USE  TO CHECK BLOOD SUGAR TWICE DAILY 200 each 0    No current facility-administered medications on file prior to visit.     Allergies  Allergen Reactions  . Zestril [Lisinopril]     angioedema    Family History  Problem Relation Age of Onset  . Cancer Father 23       prostate  . Kidney Stones Brother   . Diabetes Sister   . Hypertension Neg Hx   . Colon cancer Neg Hx     BP 134/76 (BP Location: Left Arm, Patient Position: Sitting, Cuff Size: Normal)   Pulse 89   Temp 99.3 F (37.4 C) (Oral)   Ht 5\' 7"  (1.702 m)   Wt 224 lb (101.6 kg)   SpO2 98%   BMI 35.08 kg/m    Review of Systems He denies hypoglycemia.     Objective:   Physical Exam VITAL SIGNS:  See vs page GENERAL: no distress Pulses: dorsalis pedis intact bilat.   MSK: no deformity of the feet CV: no leg edema Skin:  no ulcer on the feet.  normal color and temp on the feet. Neuro: sensation is intact to touch on the feet. Ext: There is bilateral onychomycosis of the toenails  Lab Results  Component Value Date   CREATININE 1.70 (H) 03/15/2017   BUN 21 03/15/2017   NA 139 03/15/2017   K 4.2 03/15/2017   CL 102 03/15/2017   CO2 31 03/15/2017    Lab Results  Component Value Date   HGBA1C 8.3 10/02/2017      Assessment & Plan:  HTN: pt says there is situational component, so we'll continue same rx for now. Renal failure: this limits rx options for BP and DM. Insulin-requiring type 2 DM: he needs increased rx.  Patient Instructions  Please increase the NPH insulin to 45 units at bedtime, and:  Please continue the same medications for blood pressure.   check your blood sugar twice a day.  vary the time of day when you check, between before the 3 meals, and at bedtime.  also check if you have symptoms of your blood sugar being too  high or too low.  please keep a record of the readings and bring it to your next appointment here (or you can bring the meter itself).  You can write it on any piece of paper.  please call us sooner if your blood sugar  goes below 70, or if you have a lot of readings over 200.  Please come back for a follow-up appointment in 3 months.

## 2017-11-09 DIAGNOSIS — C61 Malignant neoplasm of prostate: Secondary | ICD-10-CM | POA: Diagnosis not present

## 2017-11-16 DIAGNOSIS — C61 Malignant neoplasm of prostate: Secondary | ICD-10-CM | POA: Diagnosis not present

## 2017-11-16 DIAGNOSIS — N5201 Erectile dysfunction due to arterial insufficiency: Secondary | ICD-10-CM | POA: Diagnosis not present

## 2017-11-16 DIAGNOSIS — N189 Chronic kidney disease, unspecified: Secondary | ICD-10-CM | POA: Diagnosis not present

## 2017-11-27 ENCOUNTER — Other Ambulatory Visit: Payer: Self-pay | Admitting: Endocrinology

## 2018-01-04 ENCOUNTER — Ambulatory Visit (INDEPENDENT_AMBULATORY_CARE_PROVIDER_SITE_OTHER): Payer: Medicare HMO | Admitting: Endocrinology

## 2018-01-04 ENCOUNTER — Encounter: Payer: Self-pay | Admitting: Endocrinology

## 2018-01-04 VITALS — BP 149/91 | HR 78 | Wt 223.6 lb

## 2018-01-04 DIAGNOSIS — E119 Type 2 diabetes mellitus without complications: Secondary | ICD-10-CM | POA: Diagnosis not present

## 2018-01-04 LAB — POCT GLYCOSYLATED HEMOGLOBIN (HGB A1C): HEMOGLOBIN A1C: 7.8 % — AB (ref 4.0–5.6)

## 2018-01-04 NOTE — Progress Notes (Signed)
Subjective:    Patient ID: John Fry, male    DOB: 07-17-1940, 77 y.o.   MRN: [id number]  HPI Pt returns for f/u of diabetes mellitus: DM type: 1 Dx'ed: 1517 Complications: renal insuff  Therapy: insulin since dx DKA: only at dx.   Severe hypoglycemia: never.  Pancreatitis: never.   Other:  he takes human insulin, due to cost; he takes multiple daily injections.  Interval history: no cbg record, but states cbg's are well-controlled, but variable.  pt states he feels well in general.   Pt says BP at home is normal.  He says he never misses the BP meds.  Past Medical History:  Diagnosis Date  . Diabetes mellitus without complication ([place])   . DYSLIPIDEMIA 03/05/2007  . HERNIA 03/05/2007  . HYPERTENSION 03/05/2007  . 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 RENAL INSUFFICIENCY 05/22/2008  . S/P radiation therapy 04/14/2014 through 06/11/2014                                                      Prostate 7800 cGy in 40 sessions, seminal vesicles 5600 cGy in 40 sessions                        . SHOULDER PAIN, RIGHT 05/08/2008  . 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 FOOT SURGERY  1999   Right Foot Bunion Repair  . Lower Arterial  05/06/2003  . PROSTATE BIOPSY  07/23/13   gleason 7, volume 53 ml  . remove fatty tumor Left 1995   thigh    Social History   Socioeconomic History  . Marital status: Married    Spouse name: Not on file  . Number of children: Not on file  . Years of education: Not on file  . Highest education level: Not on file  Occupational History    Comment: Retired  Scientific laboratory technician  . Financial resource strain: Not on file  . Food insecurity:    Worry: Not on file    Inability: Not on file  . Transportation needs:    Medical: Not on file    Non-medical: Not on file  Tobacco Use  . Smoking status: Former Smoker    Packs/day: 0.50    Years: 20.00    Pack years: 10.00      Last attempt to quit: 06/20/2006    Years since quitting: 11.5  . Smokeless tobacco: Never Used  Substance and Sexual Activity  . Alcohol use: No    Alcohol/week: 0.0 oz  . Drug use: No  . Sexual activity: Not on file  Lifestyle  . Physical activity:    Days per week: Not on file    Minutes per session: Not on file  . Stress: Not on file  Relationships  . Social connections:    Talks on phone: Not on file    Gets together: Not on file    Attends religious service: Not on file    Active member of club or organization: Not on file    Attends meetings of clubs or organizations: Not on file    Relationship status: Not on file  . Intimate partner violence:    Fear of current or ex partner:  Not on file    Emotionally abused: Not on file    Physically abused: Not on file    Forced sexual activity: Not on file  Other Topics Concern  . Not on file  Social History Narrative  . Not on file    Current Outpatient Medications on File Prior to Visit  Medication Sig Dispense Refill  . Alcohol Swabs (B-D SINGLE USE SWABS REGULAR) PADS USE  DAILY 200 each 0  . amLODipine (NORVASC) 10 MG tablet Take 1 tablet (10 mg total) by mouth daily. 90 tablet 3  . atorvastatin (LIPITOR) 40 MG tablet TAKE 1 TABLET EVERY DAY (NEED MD APPOINTMENT) 90 tablet 0  . hydrALAZINE (APRESOLINE) 50 MG tablet Take 1 tablet (50 mg total) by mouth 3 (three) times daily. 270 tablet 3  . hydrochlorothiazide (HYDRODIURIL) 25 MG tablet Take 1 tablet (25 mg total) by mouth daily. 90 tablet 3  . insulin NPH Human (NOVOLIN N) 100 UNIT/ML injection Inject 0.45 mLs (45 Units total) into the skin at bedtime. 20 mL 11  . insulin regular (HUMULIN R) 100 units/mL injection Inject 0.25 mLs (25 Units total) into the skin 2 (two) times daily before a meal. 20 mL 11  . isosorbide dinitrate (ISORDIL) 20 MG tablet Take 1 tablet (20 mg total) by mouth 3 (three) times daily. 270 tablet 3  . TRUE METRIX BLOOD GLUCOSE TEST test strip USE TO  CHECK BLOOD SUGAR TWICE DAILY  ( APPOINTMENT IS NEEDED  FOR  FURTHER  REFILLS) 200 each 4  . TRUEPLUS LANCETS 30G MISC USE  TO CHECK BLOOD SUGAR TWICE DAILY 200 each 0   No current facility-administered medications on file prior to visit.     Allergies  Allergen Reactions  . Zestril [Lisinopril]     angioedema    Family History  Problem Relation Age of Onset  . Cancer Father 85       prostate  . Kidney Stones Brother   . Diabetes Sister   . Hypertension Neg Hx   . Colon cancer Neg Hx     BP (!) 149/91 (BP Location: Left Arm, Cuff Size: Normal)   Pulse 78   Wt 223 lb 9.6 oz (101.4 kg)   SpO2 95%   BMI 35.02 kg/m    Review of Systems He denies hypoglycemia.      Objective:   Physical Exam VITAL SIGNS:  See vs page GENERAL: no distress Pulses: dorsalis pedis intact bilat.   MSK: no deformity of the feet.   CV: no leg edema.   Skin:  no ulcer on the feet.  normal color and temp on the feet.   Neuro: sensation is intact to touch on the feet.     Lab Results  Component Value Date   HGBA1C 7.8 (A) 01/04/2018       Assessment & Plan:  HTN: with apparent situational component Type 1 DM: this is the best control this pt should aim for, given variable cbg's  Patient Instructions  Please continue the same medications  check your blood sugar twice a day.  vary the time of day when you check, between before the 3 meals, and at bedtime.  also check if you have symptoms of your blood sugar being too high or too low.  please keep a record of the readings and bring it to your next appointment here (or you can bring the meter itself).  You can write it on any piece of paper.  please call us sooner  if your blood sugar goes below 70, or if you have a lot of readings over 200.  Please come back for a follow-up appointment in 3 months.

## 2018-01-04 NOTE — Patient Instructions (Addendum)
Please continue the same medications.   check your blood sugar twice a day.  vary the time of day when you check, between before the 3 meals, and at bedtime.  also check if you have symptoms of your blood sugar being too high or too low.  please keep a record of the readings and bring it to your next appointment here (or you can bring the meter itself).  You can write it on any piece of paper.  please call us sooner if your blood sugar goes below 70, or if you have a lot of readings over 200.   Please come back for a follow-up appointment in 3 months.    

## 2018-01-31 ENCOUNTER — Other Ambulatory Visit: Payer: Self-pay | Admitting: Endocrinology

## 2018-03-06 ENCOUNTER — Other Ambulatory Visit: Payer: Self-pay | Admitting: Endocrinology

## 2018-03-07 ENCOUNTER — Other Ambulatory Visit: Payer: Self-pay

## 2018-03-07 MED ORDER — HYDRALAZINE HCL 50 MG PO TABS: 50.0000 mg | ORAL_TABLET | Freq: Three times a day (TID) | ORAL | 3 refills | Status: DC

## 2018-03-07 NOTE — Telephone Encounter (Signed)
This was changed to 50mg /deactivate 25mg /thx dmf

## 2018-04-06 ENCOUNTER — Ambulatory Visit (INDEPENDENT_AMBULATORY_CARE_PROVIDER_SITE_OTHER): Payer: Medicare HMO | Admitting: Endocrinology

## 2018-04-06 ENCOUNTER — Encounter: Payer: Self-pay | Admitting: Endocrinology

## 2018-04-06 VITALS — BP 140/70 | HR 63 | Resp 95 | Ht 67.0 in | Wt 223.8 lb

## 2018-04-06 DIAGNOSIS — N184 Chronic kidney disease, stage 4 (severe): Secondary | ICD-10-CM

## 2018-04-06 DIAGNOSIS — N19 Unspecified kidney failure: Secondary | ICD-10-CM | POA: Insufficient documentation

## 2018-04-06 DIAGNOSIS — R972 Elevated prostate specific antigen [PSA]: Secondary | ICD-10-CM | POA: Diagnosis not present

## 2018-04-06 DIAGNOSIS — E785 Hyperlipidemia, unspecified: Secondary | ICD-10-CM

## 2018-04-06 DIAGNOSIS — E213 Hyperparathyroidism, unspecified: Secondary | ICD-10-CM

## 2018-04-06 DIAGNOSIS — E119 Type 2 diabetes mellitus without complications: Secondary | ICD-10-CM

## 2018-04-06 DIAGNOSIS — N259 Disorder resulting from impaired renal tubular function, unspecified: Secondary | ICD-10-CM | POA: Diagnosis not present

## 2018-04-06 DIAGNOSIS — E559 Vitamin D deficiency, unspecified: Secondary | ICD-10-CM | POA: Diagnosis not present

## 2018-04-06 DIAGNOSIS — Z23 Encounter for immunization: Secondary | ICD-10-CM | POA: Diagnosis not present

## 2018-04-06 DIAGNOSIS — Z0000 Encounter for general adult medical examination without abnormal findings: Secondary | ICD-10-CM | POA: Diagnosis not present

## 2018-04-06 LAB — URINALYSIS, ROUTINE W REFLEX MICROSCOPIC
Bilirubin Urine: NEGATIVE
HGB URINE DIPSTICK: NEGATIVE
KETONES UR: NEGATIVE
LEUKOCYTES UA: NEGATIVE
Nitrite: NEGATIVE
Specific Gravity, Urine: 1.025 (ref 1.000–1.030)
TOTAL PROTEIN, URINE-UPE24: NEGATIVE
URINE GLUCOSE: NEGATIVE
UROBILINOGEN UA: 0.2 (ref 0.0–1.0)
pH: 5.5 (ref 5.0–8.0)

## 2018-04-06 LAB — BASIC METABOLIC PANEL
BUN: 28 mg/dL — AB (ref 6–23)
CHLORIDE: 103 meq/L (ref 96–112)
CO2: 28 mEq/L (ref 19–32)
Calcium: 10.4 mg/dL (ref 8.4–10.5)
Creatinine, Ser: 2 mg/dL — ABNORMAL HIGH (ref 0.40–1.50)
GFR: 41.78 mL/min — AB (ref >60.00)
GLUCOSE: 150 mg/dL — AB (ref 70–99)
POTASSIUM: 4 meq/L (ref 3.5–5.1)
SODIUM: 139 meq/L (ref 135–145)

## 2018-04-06 LAB — LIPID PANEL
CHOLESTEROL: 126 mg/dL (ref 0–200)
HDL: 40.5 mg/dL (ref >39.00)
LDL CALC: 58 mg/dL (ref 0–99)
NonHDL: 85.62
TRIGLYCERIDES: 137 mg/dL (ref 0.0–149.0)
Total CHOL/HDL Ratio: 3
VLDL: 27.4 mg/dL (ref 0.0–40.0)

## 2018-04-06 LAB — MICROALBUMIN / CREATININE URINE RATIO
Creatinine,U: 140.6 mg/dL
MICROALB UR: 2.1 mg/dL — AB (ref 0.0–1.9)
MICROALB/CREAT RATIO: 1.5 mg/g (ref 0.0–30.0)

## 2018-04-06 LAB — CBC WITH DIFFERENTIAL/PLATELET
BASOS PCT: 0.7 % (ref 0.0–3.0)
Basophils Absolute: 0.1 10*3/uL (ref 0.0–0.1)
EOS PCT: 3.6 % (ref 0.0–5.0)
Eosinophils Absolute: 0.3 10*3/uL (ref 0.0–0.7)
HCT: 43.8 % (ref 39.0–52.0)
Hemoglobin: 14.9 g/dL (ref 13.0–17.0)
LYMPHS ABS: 1.7 10*3/uL (ref 0.7–4.0)
Lymphocytes Relative: 23.6 % (ref 12.0–46.0)
MCHC: 34 g/dL (ref 30.0–36.0)
MCV: 93.2 fl (ref 78.0–100.0)
MONO ABS: 0.6 10*3/uL (ref 0.1–1.0)
Monocytes Relative: 8.4 % (ref 3.0–12.0)
NEUTROS PCT: 63.7 % (ref 43.0–77.0)
Neutro Abs: 4.7 10*3/uL (ref 1.4–7.7)
Platelets: 228 10*3/uL (ref 150.0–400.0)
RBC: 4.7 Mil/uL (ref 4.22–5.81)
RDW: 14.5 % (ref 11.5–15.5)
WBC: 7.4 10*3/uL (ref 4.0–10.5)

## 2018-04-06 LAB — POCT GLYCOSYLATED HEMOGLOBIN (HGB A1C): Hemoglobin A1C: 7.6 % — AB (ref 4.0–5.6)

## 2018-04-06 LAB — HEPATIC FUNCTION PANEL
ALBUMIN: 4.8 g/dL (ref 3.5–5.2)
ALT: 26 U/L (ref 0–53)
AST: 28 U/L (ref 0–37)
Alkaline Phosphatase: 61 U/L (ref 39–117)
BILIRUBIN TOTAL: 0.7 mg/dL (ref 0.2–1.2)
Bilirubin, Direct: 0.1 mg/dL (ref 0.0–0.3)
Total Protein: 7.7 g/dL (ref 6.0–8.3)

## 2018-04-06 LAB — VITAMIN D 25 HYDROXY (VIT D DEFICIENCY, FRACTURES): VITD: 57.14 ng/mL (ref 30.00–100.00)

## 2018-04-06 LAB — URIC ACID: Uric Acid, Serum: 7.7 mg/dL (ref 4.0–7.8)

## 2018-04-06 LAB — TSH: TSH: 1.68 u[IU]/mL (ref 0.35–4.50)

## 2018-04-06 NOTE — Progress Notes (Signed)
Subjective:    Patient ID: John Fry, male    DOB: Dec 22, 1940, 77 y.o.   MRN: [id number]  HPI Pt is here for regular wellness examination, and is feeling pretty well in general, and says chronic med probs are stable, except as noted below Past Medical History:  Diagnosis Date  . Diabetes mellitus without complication ([place])   . DYSLIPIDEMIA 03/05/2007  . HERNIA 03/05/2007  . HYPERTENSION 03/05/2007  . Prostate cancer ([place]) 07/23/13   Gleason 7, volume 53 ml  . RENAL INSUFFICIENCY 05/22/2008  . S/P radiation therapy 04/14/2014 through 06/11/2014                                                      Prostate 7800 cGy in 40 sessions, seminal vesicles 5600 cGy in 40 sessions                        . SHOULDER PAIN, RIGHT 05/08/2008  . 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 FOOT SURGERY  1999   Right Foot Bunion Repair  . Lower Arterial  05/06/2003  . PROSTATE BIOPSY  07/23/13   gleason 7, volume 53 ml  . remove fatty tumor Left 1995   thigh    Social History   Socioeconomic History  . Marital status: Married    Spouse name: Not on file  . Number of children: Not on file  . Years of education: Not on file  . Highest education level: Not on file  Occupational History    Comment: Retired  Scientific laboratory technician  . Financial resource strain: Not on file  . Food insecurity:    Worry: Not on file    Inability: Not on file  . Transportation needs:    Medical: Not on file    Non-medical: Not on file  Tobacco Use  . Smoking status: Former Smoker    Packs/day: 0.50    Years: 20.00    Pack years: 10.00    Last attempt to quit: 06/20/2006    Years since quitting: 11.8  . Smokeless tobacco: Never Used  Substance and Sexual Activity  . Alcohol use: No    Alcohol/week: 0.0 standard drinks  . Drug use: No  . Sexual activity: Not on file  Lifestyle  . Physical activity:    Days per week: Not on file    Minutes per session:  Not on file  . Stress: Not on file  Relationships  . Social connections:    Talks on phone: Not on file    Gets together: Not on file    Attends religious service: Not on file    Active member of club or organization: Not on file    Attends meetings of clubs or organizations: Not on file    Relationship status: Not on file  . Intimate partner violence:    Fear of current or ex partner: Not on file    Emotionally abused: Not on file    Physically abused: Not on file    Forced sexual activity: Not on file  Other Topics Concern  . Not on file  Social History Narrative  . Not on file    Current Outpatient Medications on File Prior to Visit  Medication Sig Dispense Refill  .  Alcohol Swabs (B-D SINGLE USE SWABS REGULAR) PADS USE  DAILY 200 each 0  . amLODipine (NORVASC) 10 MG tablet Take 1 tablet (10 mg total) by mouth daily. 90 tablet 3  . atorvastatin (LIPITOR) 40 MG tablet TAKE 1 TABLET EVERY DAY (NEED MD APPOINTMENT) 90 tablet 0  . hydrALAZINE (APRESOLINE) 50 MG tablet Take 1 tablet (50 mg total) by mouth 3 (three) times daily. 270 tablet 3  . hydrochlorothiazide (HYDRODIURIL) 25 MG tablet Take 1 tablet (25 mg total) by mouth daily. 90 tablet 3  . insulin NPH Human (NOVOLIN N) 100 UNIT/ML injection Inject 0.45 mLs (45 Units total) into the skin at bedtime. 20 mL 11  . insulin regular (HUMULIN R) 100 units/mL injection Inject 0.25 mLs (25 Units total) into the skin 2 (two) times daily before a meal. 20 mL 11  . isosorbide dinitrate (ISORDIL) 20 MG tablet Take 1 tablet (20 mg total) by mouth 3 (three) times daily. 270 tablet 3  . TRUE METRIX BLOOD GLUCOSE TEST test strip USE TO CHECK BLOOD SUGAR TWICE DAILY  ( APPOINTMENT IS NEEDED  FOR  FURTHER  REFILLS) 200 each 4  . TRUEPLUS LANCETS 30G MISC USE  TO CHECK BLOOD SUGAR TWICE DAILY 200 each 0   No current facility-administered medications on file prior to visit.     Allergies  Allergen Reactions  . Zestril [Lisinopril]      angioedema    Family History  Problem Relation Age of Onset  . Cancer Father 26       prostate  . Kidney Stones Brother   . Diabetes Sister   . Hypertension Neg Hx   . Colon cancer Neg Hx     BP 140/70 (BP Location: Left Arm, Patient Position: Sitting, Cuff Size: Large)   Pulse 63   Resp (!) 95   Ht 5\' 7"  (1.702 m)   Wt 223 lb 12.8 oz (101.5 kg)   BMI 35.05 kg/m     Review of Systems Denies fever, fatigue, diplopia, earache, chest pain, sob, back pain, anxiety, cold intolerance, BRBPR, hematuria, syncope, numbness, allergy sxs, easy bruising, and rash.     Objective:   Physical Exam VS: see vs page GEN: no distress HEAD: head: no deformity eyes: no periorbital swelling, no proptosis external nose and ears are normal mouth: no lesion seen NECK: supple, thyroid is not enlarged CHEST WALL: no deformity LUNGS: clear to auscultation CV: reg rate and rhythm, no murmur ABD: abdomen is soft, nontender.  no hepatosplenomegaly.  not distended.  no hernia MUSCULOSKELETAL: muscle bulk and strength are grossly normal.  no obvious joint swelling.  gait is normal and steady EXTEMITIES: no deformity.  no edema PULSES: no carotid bruit NEURO:  cn 2-12 grossly intact.   readily moves all 4's.  sensation is intact to touch on all 4's SKIN:  Normal texture and temperature.  No rash or suspicious lesion is visible.   NODES:  None palpable at the neck PSYCH: alert, well-oriented.  Does not appear anxious nor depressed.  I personally reviewed electrocardiogram tracing (today): Indication: DM Impression: NSR.  Poss old AMI.  No hypertrophy.  NS-TWA Compared  2017: no significant change      Assessment & Plan:  Wellness visit today, with problems stable, except as noted.   SEPARATE EVALUATION FOLLOWS--EACH PROBLEM HERE IS NEW, NOT RESPONDING TO TREATMENT, OR POSES SIGNIFICANT RISK TO THE PATIENT'S HEALTH: HISTORY OF THE PRESENT ILLNESS: Pt returns for f/u of diabetes mellitus: DM  type: 1  Dx'ed: 9833 Complications: renal insuff  Therapy: insulin since dx DKA: only at dx.   Severe hypoglycemia: never.  Pancreatitis: never.   Other:  he takes human insulin, due to cost; he takes multiple daily injections.  Interval history:he says cbg's are well-controlled.  He takes insulin as rx'ed PAST MEDICAL HISTORY Past Medical History:  Diagnosis Date  . Diabetes mellitus without complication ([place])   . DYSLIPIDEMIA 03/05/2007  . HERNIA 03/05/2007  . HYPERTENSION 03/05/2007  . 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 RENAL INSUFFICIENCY 05/22/2008  . S/P radiation therapy 04/14/2014 through 06/11/2014                                                      Prostate 7800 cGy in 40 sessions, seminal vesicles 5600 cGy in 40 sessions                        . SHOULDER PAIN, RIGHT 05/08/2008  . 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 FOOT SURGERY  1999   Right Foot Bunion Repair  . Lower Arterial  05/06/2003  . PROSTATE BIOPSY  07/23/13   gleason 7, volume 53 ml  . remove fatty tumor Left 1995   thigh    Social History   Socioeconomic History  . Marital status: Married    Spouse name: Not on file  . Number of children: Not on file  . Years of education: Not on file  . Highest education level: Not on file  Occupational History    Comment: Retired  Scientific laboratory technician  . Financial resource strain: Not on file  . Food insecurity:    Worry: Not on file    Inability: Not on file  . Transportation needs:    Medical: Not on file    Non-medical: Not on file  Tobacco Use  . Smoking status: Former Smoker    Packs/day: 0.50    Years: 20.00    Pack years: 10.00    Last attempt to quit: 06/20/2006    Years since quitting: 11.8  . Smokeless tobacco: Never Used  Substance and Sexual Activity  . Alcohol use: No    Alcohol/week: 0.0 standard drinks  . Drug use: No  . Sexual activity: Not on file  Lifestyle    . Physical activity:    Days per week: Not on file    Minutes per session: Not on file  . Stress: Not on file  Relationships  . Social connections:    Talks on phone: Not on file    Gets together: Not on file    Attends religious service: Not on file    Active member of club or organization: Not on file    Attends meetings of clubs or organizations: Not on file    Relationship status: Not on file  . Intimate partner violence:    Fear of current or ex partner: Not on file    Emotionally abused: Not on file    Physically abused: Not on file    Forced sexual activity: Not on file  Other Topics Concern  . Not on file  Social History Narrative  . Not on file    Current Outpatient Medications on File Prior to Visit  Medication Sig Dispense Refill  . Alcohol Swabs (B-D SINGLE USE SWABS REGULAR) PADS USE  DAILY 200 each 0  . amLODipine (NORVASC) 10 MG tablet Take 1 tablet (10 mg total) by mouth daily. 90 tablet 3  . atorvastatin (LIPITOR) 40 MG tablet TAKE 1 TABLET EVERY DAY (NEED MD APPOINTMENT) 90 tablet 0  . hydrALAZINE (APRESOLINE) 50 MG tablet Take 1 tablet (50 mg total) by mouth 3 (three) times daily. 270 tablet 3  . hydrochlorothiazide (HYDRODIURIL) 25 MG tablet Take 1 tablet (25 mg total) by mouth daily. 90 tablet 3  . insulin NPH Human (NOVOLIN N) 100 UNIT/ML injection Inject 0.45 mLs (45 Units total) into the skin at bedtime. 20 mL 11  . insulin regular (HUMULIN R) 100 units/mL injection Inject 0.25 mLs (25 Units total) into the skin 2 (two) times daily before a meal. 20 mL 11  . isosorbide dinitrate (ISORDIL) 20 MG tablet Take 1 tablet (20 mg total) by mouth 3 (three) times daily. 270 tablet 3  . TRUE METRIX BLOOD GLUCOSE TEST test strip USE TO CHECK BLOOD SUGAR TWICE DAILY  ( APPOINTMENT IS NEEDED  FOR  FURTHER  REFILLS) 200 each 4  . TRUEPLUS LANCETS 30G MISC USE  TO CHECK BLOOD SUGAR TWICE DAILY 200 each 0   No current facility-administered medications on file prior to  visit.     Allergies  Allergen Reactions  . Zestril [Lisinopril]     angioedema    Family History  Problem Relation Age of Onset  . Cancer Father 52       prostate  . Kidney Stones Brother   . Diabetes Sister   . Hypertension Neg Hx   . Colon cancer Neg Hx     BP 140/70 (BP Location: Left Arm, Patient Position: Sitting, Cuff Size: Large)   Pulse 63   Resp (!) 95   Ht 5\' 7"  (1.702 m)   Wt 223 lb 12.8 oz (101.5 kg)   BMI 35.05 kg/m   REVIEW OF SYSTEMS: He denies hypoglycemia PHYSICAL EXAMINATION: VITAL SIGNS:  See vs page.  GENERAL: no distress.  Pulses: dorsalis pedis intact bilat.   MSK: no deformity of the feet, except for a few overlapping toes.  There is bilateral onychomycosis of the toenails.  CV: no leg edema Skin:  no ulcer on the feet, but the skin is dry.  normal color and temp on the feet. Neuro: sensation is intact to touch on the feet  LAB/XRAY RESULTS: Lab Results  Component Value Date   HGBA1C 7.6 (A) 04/06/2018    IMPRESSION: HTN: still suboptimal control, but improved.  Insulin-requiring type 2 DM: this is the best control this pt should aim for, given advanced age Dyslipidemia: reheck today Secondary hyperparathyroidism: recheck today. PLAN:  Please continue the same medications for blood pressure for now. Please continue the same insulin  Subjective:   Patient here for Medicare annual wellness visit and management of other chronic and acute problems.     Risk factors: advanced age    31 of Physicians Providing Medical Care to Patient:  See "snapshot"   Activities of Daily Living: In your present state of health, do you have any difficulty performing the following activities (lives alone)?:  Preparing food and eating?: No  Bathing yourself: No  Getting dressed: No  Using the toilet:No  Moving around from place to place: No  In the past year have you fallen or had a near fall?:No    Home Safety: Has smoke  detector and wears seat  belts. No firearms.   Opioid Use: none   Diet and Exercise  Current exercise habits: pt says good Dietary issues discussed: pt reports a somewhat healthy diet   Depression Screen  Q1: Over the past two weeks, have you felt down, depressed or hopeless?no  Q2: Over the past two weeks, have you felt little interest or pleasure in doing things? no   The following portions of the patient's history were reviewed and updated as appropriate: allergies, current medications, past family history, past medical history, past social history, past surgical history and problem list.   Review of Systems  Denies hearing loss, and visual loss Objective:   Vision:  See [facility] done today Hearing: grossly normal Body mass index:  See vs page Msk: pt easily and quickly performs "get-up-and-go" from a sitting position.   Cognitive Impairment Assessment: cognition, memory and judgment appear normal.  remembers 3/3 at 5 minutes.  excellent recall.  can easily read and write a sentence.  alert and oriented x 3   Assessment:   Medicare wellness utd on preventive parameters    Plan:   During the course of the visit the patient was educated and counseled about appropriate screening and preventive services including:        Fall prevention is advised today  Diabetes screening is UTD Nutrition counseling is offered today  advanced directives/end of life addressed today:  see healthcare directives hyperlink  Vaccines are updated as needed  Patient Instructions (the written plan) was given to the patient.

## 2018-04-06 NOTE — Progress Notes (Signed)
we discussed code status.  pt requests full code, but would not want to be started or maintained on artificial life-support measures if there was not a reasonable chance of recovery 

## 2018-04-06 NOTE — Patient Instructions (Addendum)
Please make an appointment to go back to see Dr Johnsie Cancel. Please continue the same medications. Please consider these measures for your health:  minimize alcohol.  Do not use tobacco products.  Have a colonoscopy at least every 10 years from age 77.  Keep firearms safely stored.  Always use seat belts.  have working smoke alarms in your home.  See an eye doctor and dentist regularly.  Never drive under the influence of alcohol or drugs (including prescription drugs).  It is critically important to prevent falling down (keep floor areas well-lit, dry, and free of loose objects.  If you have a cane, walker, or wheelchair, you should use it, even for short trips around the house.  Wear flat-soled shoes.  Also, try not to rush). good diet and exercise significantly improve the control of your diabetes.  please let me know if you wish to be referred to a dietician.  high blood sugar is very risky to your health.  you should see an eye doctor and dentist every year.  It is very important to get all recommended vaccinations.  blood tests are requested for you today.  We'll let you know about the results. Please come back for a follow-up appointment in 3 months.

## 2018-04-09 LAB — PTH, INTACT AND CALCIUM
Calcium: 10.4 mg/dL — ABNORMAL HIGH (ref 8.6–10.3)
PTH: 63 pg/mL (ref 14–64)

## 2018-04-10 ENCOUNTER — Telehealth: Payer: Self-pay

## 2018-04-10 ENCOUNTER — Other Ambulatory Visit: Payer: Self-pay | Admitting: Endocrinology

## 2018-04-10 NOTE — Telephone Encounter (Signed)
-----   Message from Renato Shin, MD sent at 04/06/2018  6:34 PM EDT ----- please call patient: Kidneys are slightly worse.  Please see a kidney specialist.  you will receive a phone call, about a day and time for an appointment

## 2018-04-10 NOTE — Telephone Encounter (Signed)
Called pt to inform of lab results and of new referral. Unable to LVM d/t no VM. Will continue efforts to reach pt.

## 2018-04-11 NOTE — Telephone Encounter (Signed)
Second attempt to reach pt. Pt made aware of lab results and referral. Verbalized acceptance and understanding.

## 2018-05-04 ENCOUNTER — Other Ambulatory Visit: Payer: Self-pay | Admitting: Endocrinology

## 2018-05-28 ENCOUNTER — Other Ambulatory Visit: Payer: Self-pay | Admitting: Endocrinology

## 2018-05-29 DIAGNOSIS — N184 Chronic kidney disease, stage 4 (severe): Secondary | ICD-10-CM | POA: Diagnosis not present

## 2018-05-29 DIAGNOSIS — N183 Chronic kidney disease, stage 3 (moderate): Secondary | ICD-10-CM | POA: Diagnosis not present

## 2018-05-29 DIAGNOSIS — I129 Hypertensive chronic kidney disease with stage 1 through stage 4 chronic kidney disease, or unspecified chronic kidney disease: Secondary | ICD-10-CM | POA: Diagnosis not present

## 2018-05-29 DIAGNOSIS — E669 Obesity, unspecified: Secondary | ICD-10-CM | POA: Diagnosis not present

## 2018-05-31 ENCOUNTER — Encounter: Payer: Self-pay | Admitting: Family Medicine

## 2018-05-31 ENCOUNTER — Ambulatory Visit (INDEPENDENT_AMBULATORY_CARE_PROVIDER_SITE_OTHER): Payer: Medicare HMO | Admitting: Family Medicine

## 2018-05-31 DIAGNOSIS — N189 Chronic kidney disease, unspecified: Secondary | ICD-10-CM | POA: Insufficient documentation

## 2018-05-31 DIAGNOSIS — N183 Chronic kidney disease, stage 3 unspecified: Secondary | ICD-10-CM

## 2018-05-31 DIAGNOSIS — I10 Essential (primary) hypertension: Secondary | ICD-10-CM

## 2018-05-31 DIAGNOSIS — Z794 Long term (current) use of insulin: Secondary | ICD-10-CM

## 2018-05-31 DIAGNOSIS — E1122 Type 2 diabetes mellitus with diabetic chronic kidney disease: Secondary | ICD-10-CM | POA: Diagnosis not present

## 2018-05-31 DIAGNOSIS — C61 Malignant neoplasm of prostate: Secondary | ICD-10-CM | POA: Diagnosis not present

## 2018-05-31 NOTE — Assessment & Plan Note (Signed)
-  Stable, continues to follow with urology.

## 2018-05-31 NOTE — Progress Notes (Signed)
John Fry - 77 y.o. male MRN [id number]  Date of birth: December 26, 1940  Subjective Chief Complaint  Patient presents with  . Establish Care    Denies changes in his health-has been followed by Dr. Loanne Drilling    HPI John Fry is a 77 y.o. male with history of T2DM, HTN, CKD and prostate cancer here today to establish with new PCP.   He plans to continue to follow with Dr. Loanne Drilling for mgmt of diabetes.  He was also recently seen by nephrology for CKD.  He reports that they are monitoring his kidney function for now.    He reports he is compliant with current BP medications.  BP is elevated today but reports this is typical for when he comes to the clinic and tends to improve by the end of his appt.  He denies anginal symptoms, headache, vision changes or shortness of breath.   He continues to follow with urology for history of prostate cancer.  He denies significant LUT's at this time.    He had flu vaccine this year and is up to date on pneumonia vaccine.   ROS:  A comprehensive ROS was completed and negative except as noted per HPI   Allergies  Allergen Reactions  . Zestril [Lisinopril]     angioedema    Past Medical History:  Diagnosis Date  . Diabetes mellitus without complication ([place])   . DYSLIPIDEMIA 03/05/2007  . HERNIA 03/05/2007  . HYPERTENSION 03/05/2007  . 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 RENAL INSUFFICIENCY 05/22/2008  . S/P radiation therapy 04/14/2014 through 06/11/2014                                                      Prostate 7800 cGy in 40 sessions, seminal vesicles 5600 cGy in 40 sessions                        . SHOULDER PAIN, RIGHT 05/08/2008  . 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 FOOT SURGERY  1999   Right Foot Bunion Repair  . Lower Arterial  05/06/2003  . PROSTATE BIOPSY  07/23/13   gleason 7, volume 53 ml  . remove fatty tumor Left 1995   thigh     Social History   Socioeconomic History  . Marital status: Married    Spouse name: Not on file  . Number of children: Not on file  . Years of education: Not on file  . Highest education level: Not on file  Occupational History    Comment: Retired  Scientific laboratory technician  . Financial resource strain: Not on file  . Food insecurity:    Worry: Not on file    Inability: Not on file  . Transportation needs:    Medical: Not on file    Non-medical: Not on file  Tobacco Use  . Smoking status: Former Smoker    Packs/day: 0.50    Years: 20.00    Pack years: 10.00    Last attempt to quit: 06/20/2006    Years since quitting: 11.9  . Smokeless tobacco: Never Used  Substance and Sexual Activity  . Alcohol use: No    Alcohol/week: 0.0 standard drinks  . Drug use: No  .  Sexual activity: Not on file  Lifestyle  . Physical activity:    Days per week: Not on file    Minutes per session: Not on file  . Stress: Not on file  Relationships  . Social connections:    Talks on phone: Not on file    Gets together: Not on file    Attends religious service: Not on file    Active member of club or organization: Not on file    Attends meetings of clubs or organizations: Not on file    Relationship status: Not on file  Other Topics Concern  . Not on file  Social History Narrative  . Not on file    Family History  Problem Relation Age of Onset  . Cancer Father 28       prostate  . Kidney Stones Brother   . Diabetes Sister   . Hypertension Neg Hx   . Colon cancer Neg Hx     Health Maintenance  Topic Date Due  . OPHTHALMOLOGY EXAM  01/18/2018  . COLONOSCOPY  09/30/2018  . HEMOGLOBIN A1C  10/06/2018  . FOOT EXAM  01/05/2019  . TETANUS/TDAP  07/10/2024  . INFLUENZA VACCINE  Completed  . PNA vac Low Risk Adult  Completed     ----------------------------------------------------------------------------------------------------------------------------------------------------------------------------------------------------------------- Physical Exam BP (!) 148/76   Pulse 74   Temp 97.9 F (36.6 C) (Oral)   Ht 5\' 7"  (1.702 m)   Wt 224 lb (101.6 kg)   SpO2 100%   BMI 35.08 kg/m   Physical Exam Constitutional:      Appearance: Normal appearance.  HENT:     Head: Normocephalic and atraumatic.     Mouth/Throat:     Mouth: Mucous membranes are moist.  Eyes:     General: No scleral icterus. Neck:     Musculoskeletal: Neck supple.  Cardiovascular:     Rate and Rhythm: Normal rate and regular rhythm.     Heart sounds: Normal heart sounds.  Pulmonary:     Effort: Pulmonary effort is normal.     Breath sounds: Normal breath sounds.  Musculoskeletal:        General: No swelling.  Lymphadenopathy:     Cervical: No cervical adenopathy.  Skin:    General: Skin is warm and dry.     Findings: No rash.  Neurological:     General: No focal deficit present.     Mental Status: He is alert.  Psychiatric:        Mood and Affect: Mood normal.        Behavior: Behavior normal.     ------------------------------------------------------------------------------------------------------------------------------------------------------------------------------------------------------------------- Assessment and Plan  HTN (hypertension) -BP elevated initially, improved some on recheck.  He will continue current medications.   Diabetes -He will continue to follow with endocrinology for mgmt of his diabetes.   Prostate cancer -Stable, continues to follow with urology.   CKD (chronic kidney disease) -Followed by nephrology.  Labs completed last week, still awaiting results.

## 2018-05-31 NOTE — Patient Instructions (Signed)
-  Great to meet you today! -Continue current medications and I will see you back in 6 months.

## 2018-05-31 NOTE — Assessment & Plan Note (Signed)
-  BP elevated initially, improved some on recheck.  He will continue current medications.

## 2018-05-31 NOTE — Assessment & Plan Note (Signed)
-  Followed by nephrology.  Labs completed last week, still awaiting results.

## 2018-05-31 NOTE — Assessment & Plan Note (Signed)
-  He will continue to follow with endocrinology for mgmt of his diabetes.

## 2018-06-01 ENCOUNTER — Other Ambulatory Visit: Payer: Self-pay | Admitting: Nephrology

## 2018-06-01 DIAGNOSIS — N183 Chronic kidney disease, stage 3 unspecified: Secondary | ICD-10-CM

## 2018-06-05 ENCOUNTER — Ambulatory Visit
Admission: RE | Admit: 2018-06-05 | Discharge: 2018-06-05 | Disposition: A | Discharge status: Home / Self Care | Payer: Medicare HMO | Source: Ambulatory Visit | Attending: Nephrology | Admitting: Nephrology

## 2018-06-05 DIAGNOSIS — N183 Chronic kidney disease, stage 3 unspecified: Secondary | ICD-10-CM

## 2018-06-11 ENCOUNTER — Other Ambulatory Visit: Payer: Self-pay | Admitting: Nephrology

## 2018-06-11 DIAGNOSIS — N281 Cyst of kidney, acquired: Secondary | ICD-10-CM

## 2018-06-22 ENCOUNTER — Other Ambulatory Visit: Payer: Self-pay | Admitting: Endocrinology

## 2018-06-23 ENCOUNTER — Ambulatory Visit
Admission: RE | Admit: 2018-06-23 | Discharge: 2018-06-23 | Disposition: A | Discharge status: Home / Self Care | Payer: Medicare HMO | Source: Ambulatory Visit | Attending: Nephrology | Admitting: Nephrology

## 2018-06-23 DIAGNOSIS — K8020 Calculus of gallbladder without cholecystitis without obstruction: Secondary | ICD-10-CM | POA: Diagnosis not present

## 2018-06-23 DIAGNOSIS — N281 Cyst of kidney, acquired: Secondary | ICD-10-CM | POA: Diagnosis not present

## 2018-06-23 DIAGNOSIS — K7689 Other specified diseases of liver: Secondary | ICD-10-CM | POA: Diagnosis not present

## 2018-07-03 ENCOUNTER — Other Ambulatory Visit: Payer: Self-pay | Admitting: *Deleted

## 2018-07-03 MED ORDER — ATORVASTATIN CALCIUM 40 MG PO TABS: ORAL_TABLET | ORAL | 0 refills | Status: DC

## 2018-07-10 ENCOUNTER — Encounter: Payer: Self-pay | Admitting: Endocrinology

## 2018-07-10 ENCOUNTER — Ambulatory Visit (INDEPENDENT_AMBULATORY_CARE_PROVIDER_SITE_OTHER): Payer: Medicare HMO | Admitting: Endocrinology

## 2018-07-10 VITALS — BP 126/70 | HR 78 | Ht 67.0 in | Wt 223.4 lb

## 2018-07-10 DIAGNOSIS — E119 Type 2 diabetes mellitus without complications: Secondary | ICD-10-CM | POA: Diagnosis not present

## 2018-07-10 DIAGNOSIS — E162 Hypoglycemia, unspecified: Secondary | ICD-10-CM

## 2018-07-10 LAB — POCT GLYCOSYLATED HEMOGLOBIN (HGB A1C): HEMOGLOBIN A1C: 7.7 % — AB (ref 4.0–5.6)

## 2018-07-10 MED ORDER — INSULIN NPH (HUMAN) (ISOPHANE) 100 UNIT/ML ~~LOC~~ SUSP: 30.0000 [IU] | Freq: Every day | SUBCUTANEOUS | 11 refills | Status: DC

## 2018-07-10 NOTE — Patient Instructions (Signed)
Please continue the same insulins (including NPH, 30 units at bedtime). check your blood sugar twice a day.  vary the time of day when you check, between before the 3 meals, and at bedtime.  also check if you have symptoms of your blood sugar being too high or too low.  please keep a record of the readings and bring it to your next appointment here (or you can bring the meter itself).  You can write it on any piece of paper.  please call us sooner if your blood sugar goes below 70, or if you have a lot of readings over 200.  Please come back for a follow-up appointment in 3 months.

## 2018-07-10 NOTE — Progress Notes (Signed)
Subjective:    Patient ID: John Fry, male    DOB: [birth year], 78 y.o.   MRN: [id number]  HPI Pt returns for f/u of diabetes mellitus: DM type: 1 Dx'ed: 5885 Complications: renal insuff  Therapy: insulin since dx DKA: only at dx.   Severe hypoglycemia: never.  Pancreatitis: never.   Other:  he takes human insulin, due to cost; he takes multiple daily injections.  Interval history: pt says he never misses the insulin.  he says cbg's are well-controlled.  He takes insulin as rx'ed.  He had to reduce HS insulin to 30 units, due to nocturnal hypoglycemia.  Past Medical History:  Diagnosis Date  . Diabetes mellitus without complication ([place])   . DYSLIPIDEMIA 03/05/2007  . HERNIA 03/05/2007  . HYPERTENSION 03/05/2007  . 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 RENAL INSUFFICIENCY 05/22/2008  . S/P radiation therapy 04/14/2014 through 06/11/2014                                                      Prostate 7800 cGy in 40 sessions, seminal vesicles 5600 cGy in 40 sessions                        . SHOULDER PAIN, RIGHT 05/08/2008  . 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 FOOT SURGERY  1999   Right Foot Bunion Repair  . Lower Arterial  05/06/2003  . PROSTATE BIOPSY  07/23/13   gleason 7, volume 53 ml  . remove fatty tumor Left 1995   thigh    Social History   Socioeconomic History  . Marital status: Married    Spouse name: Not on file  . Number of children: Not on file  . Years of education: Not on file  . Highest education level: Not on file  Occupational History    Comment: Retired  Scientific laboratory technician  . Financial resource strain: Not on file  . Food insecurity:    Worry: Not on file    Inability: Not on file  . Transportation needs:    Medical: Not on file    Non-medical: Not on file  Tobacco Use  . Smoking status: Former Smoker    Packs/day: 0.50    Years: 20.00    Pack years: 10.00    Last attempt to quit: 06/20/2006    Years since quitting: 12.0  . Smokeless tobacco: Never Used  Substance and Sexual Activity  . Alcohol use: No    Alcohol/week: 0.0 standard drinks  . Drug use: No  . Sexual activity: Not on file  Lifestyle  . Physical activity:    Days per week: Not on file    Minutes per session: Not on file  . Stress: Not on file  Relationships  . Social connections:    Talks on phone: Not on file    Gets together: Not on file    Attends religious service: Not on file    Active member of club or organization: Not on file    Attends meetings of clubs or organizations: Not on file    Relationship status: Not on file  . Intimate partner violence:    Fear of current or ex partner: Not on file  Emotionally abused: Not on file    Physically abused: Not on file    Forced sexual activity: Not on file  Other Topics Concern  . Not on file  Social History Narrative  . Not on file    Current Outpatient Medications on File Prior to Visit  Medication Sig Dispense Refill  . Alcohol Swabs (B-D SINGLE USE SWABS REGULAR) PADS USE  DAILY 200 each 0  . amLODipine (NORVASC) 10 MG tablet Take 1 tablet (10 mg total) by mouth daily. 90 tablet 3  . atorvastatin (LIPITOR) 40 MG tablet TAKE 1 TABLET EVERY DAY 90 tablet 0  . hydrALAZINE (APRESOLINE) 50 MG tablet Take 1 tablet (50 mg total) by mouth 3 (three) times daily. 270 tablet 3  . hydrochlorothiazide (HYDRODIURIL) 25 MG tablet Take 1 tablet (25 mg total) by mouth daily. 90 tablet 3  . insulin regular (HUMULIN R) 100 units/mL injection Inject 0.25 mLs (25 Units total) into the skin 2 (two) times daily before a meal. 20 mL 11  . TRUE METRIX BLOOD GLUCOSE TEST test strip USE TO CHECK BLOOD SUGAR TWICE DAILY  ( APPOINTMENT IS NEEDED  FOR  FURTHER  REFILLS) 200 each 4  . TRUEPLUS LANCETS 30G MISC USE  TO CHECK BLOOD SUGAR TWICE DAILY 200 each 0   No current facility-administered medications on file prior to visit.      Allergies  Allergen Reactions  . Zestril [Lisinopril]     angioedema    Family History  Problem Relation Age of Onset  . Cancer Father 20       prostate  . Kidney Stones Brother   . Diabetes Sister   . Hypertension Neg Hx   . Colon cancer Neg Hx     BP 126/70 (BP Location: Left Arm, Patient Position: Sitting, Cuff Size: Large)   Pulse 78   Ht 5\' 7"  (1.702 m)   Wt 223 lb 6.4 oz (101.3 kg)   SpO2 95%   BMI 34.99 kg/m    Review of Systems Denies LOC    Objective:   Physical Exam VITAL SIGNS:  See vs page.  GENERAL: no distress.  Pulses: dorsalis pedis intact bilat.   MSK: no deformity of the feet, except for a few overlapping toes.  There is bilateral onychomycosis of the toenails.  CV: no leg edema Skin:  no ulcer on the feet, but the skin is dry and scaly.  normal color and temp on the feet. Neuro: sensation is intact to touch on the feet.    A1c=7.7%     Assessment & Plan:  Type 1 DM: this is the best control this pt should aim for, given variable cbg's Hypoglycemia: this limits aggressiveness of glycemic control.   Advanced age.  In this context, he is not a candidate for more aggressive glycemic control.    Patient Instructions  Please continue the same insulins (including NPH, 30 units at bedtime). check your blood sugar twice a day.  vary the time of day when you check, between before the 3 meals, and at bedtime.  also check if you have symptoms of your blood sugar being too high or too low.  please keep a record of the readings and bring it to your next appointment here (or you can bring the meter itself).  You can write it on any piece of paper.  please call us sooner if your blood sugar goes below 70, or if you have a lot of readings over 200.  Please  come back for a follow-up appointment in 3 months.

## 2018-08-28 DIAGNOSIS — N183 Chronic kidney disease, stage 3 (moderate): Secondary | ICD-10-CM | POA: Diagnosis not present

## 2018-09-05 DIAGNOSIS — I129 Hypertensive chronic kidney disease with stage 1 through stage 4 chronic kidney disease, or unspecified chronic kidney disease: Secondary | ICD-10-CM | POA: Diagnosis not present

## 2018-09-05 DIAGNOSIS — E559 Vitamin D deficiency, unspecified: Secondary | ICD-10-CM | POA: Diagnosis not present

## 2018-09-05 DIAGNOSIS — E669 Obesity, unspecified: Secondary | ICD-10-CM | POA: Diagnosis not present

## 2018-09-05 DIAGNOSIS — N183 Chronic kidney disease, stage 3 (moderate): Secondary | ICD-10-CM | POA: Diagnosis not present

## 2018-09-05 DIAGNOSIS — N281 Cyst of kidney, acquired: Secondary | ICD-10-CM | POA: Diagnosis not present

## 2018-09-08 ENCOUNTER — Other Ambulatory Visit: Payer: Self-pay | Admitting: Family Medicine

## 2018-10-09 ENCOUNTER — Ambulatory Visit: Payer: Medicare HMO | Admitting: Endocrinology

## 2018-10-11 ENCOUNTER — Other Ambulatory Visit: Payer: Self-pay | Admitting: Endocrinology

## 2018-10-29 ENCOUNTER — Encounter: Payer: Self-pay | Admitting: Internal Medicine

## 2018-11-06 ENCOUNTER — Encounter: Payer: Self-pay | Admitting: Endocrinology

## 2018-11-06 ENCOUNTER — Ambulatory Visit (INDEPENDENT_AMBULATORY_CARE_PROVIDER_SITE_OTHER): Payer: Medicare HMO | Admitting: Endocrinology

## 2018-11-06 ENCOUNTER — Other Ambulatory Visit: Payer: Self-pay

## 2018-11-06 DIAGNOSIS — N184 Chronic kidney disease, stage 4 (severe): Secondary | ICD-10-CM | POA: Diagnosis not present

## 2018-11-06 DIAGNOSIS — E1022 Type 1 diabetes mellitus with diabetic chronic kidney disease: Secondary | ICD-10-CM

## 2018-11-06 NOTE — Patient Instructions (Addendum)
Please come in to have the A1c checked check your blood sugar twice a day.  vary the time of day when you check, between before the 3 meals, and at bedtime.  also check if you have symptoms of your blood sugar being too high or too low.  please keep a record of the readings and bring it to your next appointment here (or you can bring the meter itself).  You can write it on any piece of paper.  please call us sooner if your blood sugar goes below 70, or if you have a lot of readings over 200.  Please come back for a follow-up appointment in 3 months.

## 2018-11-06 NOTE — Progress Notes (Signed)
Subjective:    Patient ID: John Fry, male    DOB: 1940-10-30, 78 y.o.   MRN: [id number]  HPI  telehealth visit today via doxy video visit.  Alternatives to telehealth are presented to this patient, and the patient agrees to the telehealth visit.   Pt is advised of the cost of the visit, and agrees to this, also.   Patient is at home, and I am at the office.   Persons attending the telehealth visit: the patient and I.  Pt returns for f/u of diabetes mellitus: DM type: 1 Dx'ed: 5397 Complications: renal insuff  Therapy: insulin since dx DKA: only at dx.   Severe hypoglycemia: never.  Pancreatitis: never.   Other:  he takes human insulin, due to cost; he takes multiple daily injections.   Interval history: pt says he never misses the insulin.  he says cbg's vary from 97-115.  There is no trend throughout the day, except it is lowest in the afternoon.  pt states he feels well in general.   Past Medical History:  Diagnosis Date  . Diabetes mellitus without complication ([place])   . DYSLIPIDEMIA 03/05/2007  . HERNIA 03/05/2007  . HYPERTENSION 03/05/2007  . 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 RENAL INSUFFICIENCY 05/22/2008  . S/P radiation therapy 04/14/2014 through 06/11/2014                                                      Prostate 7800 cGy in 40 sessions, seminal vesicles 5600 cGy in 40 sessions                        . SHOULDER PAIN, RIGHT 05/08/2008  . 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 FOOT SURGERY  1999   Right Foot Bunion Repair  . Lower Arterial  05/06/2003  . PROSTATE BIOPSY  07/23/13   gleason 7, volume 53 ml  . remove fatty tumor Left 1995   thigh    Social History   Socioeconomic History  . Marital status: Married    Spouse name: Not on file  . Number of children: Not on file  . Years of education: Not on file  . Highest education level: Not on file  Occupational  History    Comment: Retired  Scientific laboratory technician  . Financial resource strain: Not on file  . Food insecurity:    Worry: Not on file    Inability: Not on file  . Transportation needs:    Medical: Not on file    Non-medical: Not on file  Tobacco Use  . Smoking status: Former Smoker    Packs/day: 0.50    Years: 20.00    Pack years: 10.00    Last attempt to quit: 06/20/2006    Years since quitting: 12.3  . Smokeless tobacco: Never Used  Substance and Sexual Activity  . Alcohol use: No    Alcohol/week: 0.0 standard drinks  . Drug use: No  . Sexual activity: Not on file  Lifestyle  . Physical activity:    Days per week: Not on file    Minutes per session: Not on file  . Stress: Not on file  Relationships  . Social connections:    Talks  on phone: Not on file    Gets together: Not on file    Attends religious service: Not on file    Active member of club or organization: Not on file    Attends meetings of clubs or organizations: Not on file    Relationship status: Not on file  . Intimate partner violence:    Fear of current or ex partner: Not on file    Emotionally abused: Not on file    Physically abused: Not on file    Forced sexual activity: Not on file  Other Topics Concern  . Not on file  Social History Narrative  . Not on file    Current Outpatient Medications on File Prior to Visit  Medication Sig Dispense Refill  . Alcohol Swabs (B-D SINGLE USE SWABS REGULAR) PADS USE  DAILY 200 each 0  . amLODipine (NORVASC) 10 MG tablet Take 1 tablet by mouth once daily 90 tablet 0  . atorvastatin (LIPITOR) 40 MG tablet TAKE 1 TABLET EVERY DAY 90 tablet 1  . hydrALAZINE (APRESOLINE) 50 MG tablet Take 1 tablet (50 mg total) by mouth 3 (three) times daily. 270 tablet 3  . hydrochlorothiazide (HYDRODIURIL) 25 MG tablet Take 1 tablet by mouth once daily 90 tablet 0  . insulin NPH Human (NOVOLIN N) 100 UNIT/ML injection Inject 0.3 mLs (30 Units total) into the skin at bedtime. 20 mL 11  .  insulin regular (HUMULIN R) 100 units/mL injection Inject 0.25 mLs (25 Units total) into the skin 2 (two) times daily before a meal. 20 mL 11  . TRUE METRIX BLOOD GLUCOSE TEST test strip USE TO CHECK BLOOD SUGAR TWICE DAILY  ( APPOINTMENT IS NEEDED  FOR  FURTHER  REFILLS) 200 each 4  . TRUEPLUS LANCETS 30G MISC USE  TO CHECK BLOOD SUGAR TWICE DAILY 200 each 0   No current facility-administered medications on file prior to visit.     Allergies  Allergen Reactions  . Zestril [Lisinopril]     angioedema    Family History  Problem Relation Age of Onset  . Cancer Father 32       prostate  . Kidney Stones Brother   . Diabetes Sister   . Hypertension Neg Hx   . Colon cancer Neg Hx     There were no vitals taken for this visit.   Review of Systems He denies hypoglycemia.      Objective:   Physical Exam    Lab Results  Component Value Date   CREATININE 2.00 (H) 04/06/2018   BUN 28 (H) 04/06/2018   NA 139 04/06/2018   K 4.0 04/06/2018   CL 103 04/06/2018   CO2 28 04/06/2018   Lab Results  Component Value Date   HGBA1C 7.2 (A) 11/07/2018       Assessment & Plan:  Type 1 DM: well-controlled Renal failure: in this setting, he needs most of his insulin at mealtime  Patient Instructions  Please come in to have the A1c checked check your blood sugar twice a day.  vary the time of day when you check, between before the 3 meals, and at bedtime.  also check if you have symptoms of your blood sugar being too high or too low.  please keep a record of the readings and bring it to your next appointment here (or you can bring the meter itself).  You can write it on any piece of paper.  please call us sooner if your blood sugar goes below  70, or if you have a lot of readings over 200.  Please come back for a follow-up appointment in 3 months.

## 2018-11-07 ENCOUNTER — Other Ambulatory Visit: Payer: Medicare HMO

## 2018-11-07 ENCOUNTER — Ambulatory Visit (INDEPENDENT_AMBULATORY_CARE_PROVIDER_SITE_OTHER): Payer: Medicare HMO

## 2018-11-07 ENCOUNTER — Other Ambulatory Visit: Payer: Self-pay

## 2018-11-07 DIAGNOSIS — E119 Type 2 diabetes mellitus without complications: Secondary | ICD-10-CM

## 2018-11-07 LAB — POCT GLYCOSYLATED HEMOGLOBIN (HGB A1C): Hemoglobin A1C: 7.2 % — AB (ref 4.0–5.6)

## 2018-11-07 NOTE — Progress Notes (Signed)
Pt presents today for nurse visit for completion of A1C. 

## 2018-11-13 ENCOUNTER — Telehealth: Payer: Self-pay

## 2018-11-13 DIAGNOSIS — C61 Malignant neoplasm of prostate: Secondary | ICD-10-CM | POA: Diagnosis not present

## 2018-11-13 NOTE — Telephone Encounter (Signed)
LOV 11/06/18. Per Dr. Loanne Drilling, f/u in 3 mo. Called to schedule f/u appt. Unable to reach d/t no answer.

## 2018-11-23 ENCOUNTER — Other Ambulatory Visit: Payer: Self-pay | Admitting: Endocrinology

## 2018-11-30 ENCOUNTER — Encounter: Payer: Self-pay | Admitting: Family Medicine

## 2018-11-30 ENCOUNTER — Ambulatory Visit (INDEPENDENT_AMBULATORY_CARE_PROVIDER_SITE_OTHER): Payer: Medicare HMO | Admitting: Family Medicine

## 2018-11-30 DIAGNOSIS — N184 Chronic kidney disease, stage 4 (severe): Secondary | ICD-10-CM

## 2018-11-30 DIAGNOSIS — E785 Hyperlipidemia, unspecified: Secondary | ICD-10-CM

## 2018-11-30 DIAGNOSIS — I10 Essential (primary) hypertension: Secondary | ICD-10-CM | POA: Diagnosis not present

## 2018-11-30 DIAGNOSIS — E1022 Type 1 diabetes mellitus with diabetic chronic kidney disease: Secondary | ICD-10-CM

## 2018-11-30 MED ORDER — HYDROCHLOROTHIAZIDE 12.5 MG PO TABS: 12.5000 mg | ORAL_TABLET | Freq: Every day | ORAL | 3 refills | Status: DC

## 2018-11-30 NOTE — Progress Notes (Signed)
John Fry - 78 y.o. male MRN [id number]  Date of birth: Jan 25, 1941   This visit type was conducted due to national recommendations for restrictions regarding the COVID-19 Pandemic (e.g. social distancing).  This format is felt to be most appropriate for this patient at this time.  All issues noted in this document were discussed and addressed.  No physical exam was performed (except for noted visual exam findings with Video Visits).  I discussed the limitations of evaluation and management by telemedicine and the availability of in person appointments. The patient expressed understanding and agreed to proceed.  I connected with@ on 11/30/18 at  9:00 AM EDT by a video enabled telemedicine application and verified that I am speaking with the correct person using two identifiers.  Interactive audio and video telecommunications were attempted between this provider and patient, however failed, due to patient having technical difficulties OR patient did not have access to video capability.  We continued and completed visit with audio only.     Patient Location: [place] [address]   Provider location:   Home office  Chief Complaint  Patient presents with  . Follow-up    6 mo F/U HTN     HPI  John Fry is a 78 y.o. male who presents via audio/video conferencing for a telehealth visit today.  He is following up today for HTN.  He also has a history of prostate cancer s/p radiation, T2DM and HLD.  He is followed by endocrinology for management of diabetes.  A1c last month had improved to 7.2%.  No episodes of hypoglycemia.   In regards to his blood pressure he reports that this has remained well controlled. He checks BP at local pharmacy.  He is compliant with medications of HCTZ, amlodipine and hydralazine. He denies symptoms of hypotension.  He has not had any anginal symptoms, headache or vision changes.   His HLD is managed with atorvastatin.   LDL of 58 in 03/2018.  Tolerating well, denies myalgias.     ROS:  A comprehensive ROS was completed and negative except as noted per HPI  Past Medical History:  Diagnosis Date  . Diabetes mellitus without complication ([place])   . DYSLIPIDEMIA 03/05/2007  . Helicobacter pylori (H. pylori) 09/25/2013  . HERNIA 03/05/2007  . HYPERTENSION 03/05/2007  . 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 RENAL INSUFFICIENCY 05/22/2008  . S/P radiation therapy 04/14/2014 through 06/11/2014                                                      Prostate 7800 cGy in 40 sessions, seminal vesicles 5600 cGy in 40 sessions                        . SHOULDER PAIN, RIGHT 05/08/2008  . 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 FOOT SURGERY  1999   Right Foot Bunion Repair  . Lower Arterial  05/06/2003  . PROSTATE BIOPSY  07/23/13   gleason 7, volume 53 ml  . remove fatty tumor Left 1995   thigh    Family History  Problem Relation Age of Onset  . Cancer Father 52       prostate  .  Kidney Stones Brother   . Diabetes Sister   . Hypertension Neg Hx   . Colon cancer Neg Hx     Social History   Socioeconomic History  . Marital status: Married    Spouse name: Not on file  . Number of children: Not on file  . Years of education: Not on file  . Highest education level: Not on file  Occupational History    Comment: Retired  Scientific laboratory technician  . Financial resource strain: Not on file  . Food insecurity    Worry: Not on file    Inability: Not on file  . Transportation needs    Medical: Not on file    Non-medical: Not on file  Tobacco Use  . Smoking status: Former Smoker    Packs/day: 0.50    Years: 20.00    Pack years: 10.00    Quit date: 06/20/2006    Years since quitting: 12.4  . Smokeless tobacco: Never Used  Substance and Sexual Activity  . Alcohol use: No    Alcohol/week: 0.0 standard drinks  . Drug use: No  . Sexual  activity: Not on file  Lifestyle  . Physical activity    Days per week: Not on file    Minutes per session: Not on file  . Stress: Not on file  Relationships  . Social Herbalist on phone: Not on file    Gets together: Not on file    Attends religious service: Not on file    Active member of club or organization: Not on file    Attends meetings of clubs or organizations: Not on file    Relationship status: Not on file  . Intimate partner violence    Fear of current or ex partner: Not on file    Emotionally abused: Not on file    Physically abused: Not on file    Forced sexual activity: Not on file  Other Topics Concern  . Not on file  Social History Narrative  . Not on file     Current Outpatient Medications:  .  Alcohol Swabs (B-D SINGLE USE SWABS REGULAR) PADS, USE  DAILY, Disp: 200 each, Rfl: 0 .  amLODipine (NORVASC) 10 MG tablet, Take 1 tablet by mouth once daily, Disp: 90 tablet, Rfl: 0 .  atorvastatin (LIPITOR) 40 MG tablet, TAKE 1 TABLET EVERY DAY, Disp: 90 tablet, Rfl: 1 .  hydrALAZINE (APRESOLINE) 50 MG tablet, Take 1 tablet (50 mg total) by mouth 3 (three) times daily., Disp: 270 tablet, Rfl: 3 .  insulin NPH Human (NOVOLIN N) 100 UNIT/ML injection, Inject 0.3 mLs (30 Units total) into the skin at bedtime., Disp: 20 mL, Rfl: 11 .  insulin regular (HUMULIN R) 100 units/mL injection, Inject 0.25 mLs (25 Units total) into the skin 2 (two) times daily before a meal., Disp: 20 mL, Rfl: 11 .  TRUE METRIX BLOOD GLUCOSE TEST test strip, CHECK BLOOD SUGAR TWICE DAILY  ( APPOINTMENT IS NEEDED  FOR  FURTHER  REFILLS), Disp: 200 each, Rfl: 3 .  TRUEPLUS LANCETS 30G MISC, USE  TO CHECK BLOOD SUGAR TWICE DAILY, Disp: 200 each, Rfl: 0 .  hydrochlorothiazide (HYDRODIURIL) 12.5 MG tablet, Take 1 tablet (12.5 mg total) by mouth daily., Disp: 90 tablet, Rfl: 3  EXAM:  VITALS per patient if applicable: Ht 5\' 7"  (1.702 m)   Wt 221 lb (100.2 kg)   BMI 34.61 kg/m   GENERAL:  alert, oriented, in no acute distress  PSYCH/NEURO:  pleasant and cooperative, no obvious depression or anxiety, speech and thought processing grossly intact  ASSESSMENT AND PLAN:  Discussed the following assessment and plan:  Diabetes -Followed by endocrinology, fairly well controlled for age.   HTN (hypertension) -Reports home readings have been good.  -Continue current medications, HCTZ renewed.  -F/u in 6 months or sooner if needed.   Dyslipidemia Tolerating atorvastatin well, continue.     20 minutes spent non face to face   I discussed the assessment and treatment plan with the patient. The patient was provided an opportunity to ask questions and all were answered. The patient agreed with the plan and demonstrated an understanding of the instructions.   The patient was advised to call back or seek an in-person evaluation if the symptoms worsen or if the condition fails to improve as anticipated.   Luetta Nutting, DO

## 2018-11-30 NOTE — Assessment & Plan Note (Signed)
-  Reports home readings have been good.  -Continue current medications, HCTZ renewed.  -F/u in 6 months or sooner if needed.

## 2018-11-30 NOTE — Assessment & Plan Note (Signed)
-  Followed by endocrinology, fairly well controlled for age.

## 2018-11-30 NOTE — Assessment & Plan Note (Signed)
Tolerating atorvastatin well, continue.

## 2018-12-15 ENCOUNTER — Other Ambulatory Visit: Payer: Self-pay | Admitting: Endocrinology

## 2018-12-15 NOTE — Telephone Encounter (Signed)
Please forward refill request to pt's new primary care provider.  

## 2019-01-21 ENCOUNTER — Other Ambulatory Visit: Payer: Self-pay | Admitting: Family Medicine

## 2019-01-29 ENCOUNTER — Other Ambulatory Visit: Payer: Self-pay | Admitting: Endocrinology

## 2019-01-29 NOTE — Telephone Encounter (Signed)
Please forward refill request to pt's new primary care provider.  

## 2019-01-29 NOTE — Telephone Encounter (Signed)
Please advise if you wish to manage and refill 

## 2019-01-30 NOTE — Telephone Encounter (Signed)
Per Dr. Ellison's request, I am forwarding you this refill request. Please review and refill if appropriate 

## 2019-02-01 ENCOUNTER — Other Ambulatory Visit: Payer: Self-pay

## 2019-02-01 DIAGNOSIS — R350 Frequency of micturition: Secondary | ICD-10-CM | POA: Diagnosis not present

## 2019-02-01 DIAGNOSIS — C61 Malignant neoplasm of prostate: Secondary | ICD-10-CM | POA: Diagnosis not present

## 2019-02-01 DIAGNOSIS — N189 Chronic kidney disease, unspecified: Secondary | ICD-10-CM | POA: Diagnosis not present

## 2019-02-01 DIAGNOSIS — N5201 Erectile dysfunction due to arterial insufficiency: Secondary | ICD-10-CM | POA: Diagnosis not present

## 2019-02-05 ENCOUNTER — Ambulatory Visit: Payer: Medicare HMO | Admitting: Endocrinology

## 2019-03-07 ENCOUNTER — Other Ambulatory Visit: Payer: Self-pay | Admitting: Endocrinology

## 2019-03-07 ENCOUNTER — Other Ambulatory Visit: Payer: Self-pay

## 2019-03-07 NOTE — Telephone Encounter (Signed)
Please forward refill request to pt's primary care provider.   

## 2019-03-08 ENCOUNTER — Encounter: Payer: Self-pay | Admitting: Endocrinology

## 2019-03-08 ENCOUNTER — Ambulatory Visit (INDEPENDENT_AMBULATORY_CARE_PROVIDER_SITE_OTHER): Payer: Medicare HMO | Admitting: Endocrinology

## 2019-03-08 VITALS — BP 144/68 | HR 73 | Ht 67.0 in | Wt 223.2 lb

## 2019-03-08 DIAGNOSIS — I10 Essential (primary) hypertension: Secondary | ICD-10-CM

## 2019-03-08 DIAGNOSIS — Z23 Encounter for immunization: Secondary | ICD-10-CM

## 2019-03-08 DIAGNOSIS — E1029 Type 1 diabetes mellitus with other diabetic kidney complication: Secondary | ICD-10-CM

## 2019-03-08 DIAGNOSIS — E119 Type 2 diabetes mellitus without complications: Secondary | ICD-10-CM

## 2019-03-08 DIAGNOSIS — R61 Generalized hyperhidrosis: Secondary | ICD-10-CM

## 2019-03-08 LAB — POCT GLYCOSYLATED HEMOGLOBIN (HGB A1C): Hemoglobin A1C: 7.4 % — AB (ref 4.0–5.6)

## 2019-03-08 NOTE — Patient Instructions (Addendum)
Your blood pressure is high today.  Please see your primary care provider soon, to have it rechecked.   check your blood sugar twice a day.  vary the time of day when you check, between before the 3 meals, and at bedtime.  also check if you have symptoms of your blood sugar being too high or too low.  please keep a record of the readings and bring it to your next appointment here (or you can bring the meter itself).  You can write it on any piece of paper.  please call us sooner if your blood sugar goes below 70, or if you have a lot of readings over 200.  Please continue the same insulins.   Please come back for a follow-up appointment in 4 months.

## 2019-03-08 NOTE — Telephone Encounter (Signed)
Per Dr. Ellison's request, I am forwarding you this refill request. Please review and refill if appropriate 

## 2019-03-08 NOTE — Progress Notes (Signed)
Subjective:    Patient ID: John Fry, male    DOB: March 31, 1941, 78 y.o.   MRN: [id number]  HPI Pt returns for f/u of diabetes mellitus: DM type: 1 Dx'ed: 3976 Complications: renal insuff  Therapy: insulin since dx DKA: only at dx.   Severe hypoglycemia: never.  Pancreatitis: never.   Other:  he takes human insulin, due to cost; he takes multiple daily injections.   Interval history: pt says he never misses the insulin.  no cbg record, but states cbg's vary from 84-190.  However, he has diaphoresis with activity.  Oral glucose helps.  pt states he feels well in general.   Past Medical History:  Diagnosis Date  . Diabetes mellitus without complication ([place])   . DYSLIPIDEMIA 03/05/2007  . Helicobacter pylori (H. pylori) 09/25/2013  . HERNIA 03/05/2007  . HYPERTENSION 03/05/2007  . 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 RENAL INSUFFICIENCY 05/22/2008  . S/P radiation therapy 04/14/2014 through 06/11/2014                                                      Prostate 7800 cGy in 40 sessions, seminal vesicles 5600 cGy in 40 sessions                        . SHOULDER PAIN, RIGHT 05/08/2008  . 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 FOOT SURGERY  1999   Right Foot Bunion Repair  . Lower Arterial  05/06/2003  . PROSTATE BIOPSY  07/23/13   gleason 7, volume 53 ml  . remove fatty tumor Left 1995   thigh    Social History   Socioeconomic History  . Marital status: Married    Spouse name: Not on file  . Number of children: Not on file  . Years of education: Not on file  . Highest education level: Not on file  Occupational History    Comment: Retired  Scientific laboratory technician  . Financial resource strain: Not on file  . Food insecurity    Worry: Not on file    Inability: Not on file  . Transportation needs    Medical: Not on file    Non-medical: Not on file  Tobacco Use  . Smoking status: Former Smoker     Packs/day: 0.50    Years: 20.00    Pack years: 10.00    Quit date: 06/20/2006    Years since quitting: 12.7  . Smokeless tobacco: Never Used  Substance and Sexual Activity  . Alcohol use: No    Alcohol/week: 0.0 standard drinks  . Drug use: No  . Sexual activity: Not on file  Lifestyle  . Physical activity    Days per week: Not on file    Minutes per session: Not on file  . Stress: Not on file  Relationships  . Social Herbalist on phone: Not on file    Gets together: Not on file    Attends religious service: Not on file    Active member of club or organization: Not on file    Attends meetings of clubs or organizations: Not on file    Relationship status: Not on file  . Intimate partner violence  Fear of current or ex partner: Not on file    Emotionally abused: Not on file    Physically abused: Not on file    Forced sexual activity: Not on file  Other Topics Concern  . Not on file  Social History Narrative  . Not on file    Current Outpatient Medications on File Prior to Visit  Medication Sig Dispense Refill  . Alcohol Swabs (B-D SINGLE USE SWABS REGULAR) PADS USE  DAILY 200 each 0  . amLODipine (NORVASC) 10 MG tablet Take 1 tablet by mouth once daily 90 tablet 0  . atorvastatin (LIPITOR) 40 MG tablet TAKE 1 TABLET EVERY DAY 90 tablet 1  . hydrALAZINE (APRESOLINE) 50 MG tablet Take 1 tablet (50 mg total) by mouth 3 (three) times daily. 270 tablet 3  . hydrochlorothiazide (HYDRODIURIL) 25 MG tablet Take 1 tablet by mouth once daily 90 tablet 0  . insulin NPH Human (NOVOLIN N) 100 UNIT/ML injection Inject 0.3 mLs (30 Units total) into the skin at bedtime. 20 mL 11  . insulin regular (HUMULIN R) 100 units/mL injection Inject 0.25 mLs (25 Units total) into the skin 2 (two) times daily before a meal. 20 mL 11  . TRUE METRIX BLOOD GLUCOSE TEST test strip CHECK BLOOD SUGAR TWICE DAILY  ( APPOINTMENT IS NEEDED  FOR  FURTHER  REFILLS) 200 each 3  . TRUEPLUS LANCETS  30G MISC USE  TO CHECK BLOOD SUGAR TWICE DAILY 200 each 0   No current facility-administered medications on file prior to visit.     Allergies  Allergen Reactions  . Zestril [Lisinopril]     angioedema    Family History  Problem Relation Age of Onset  . Cancer Father 41       prostate  . Kidney Stones Brother   . Diabetes Sister   . Hypertension Neg Hx   . Colon cancer Neg Hx     BP (!) 144/68 (BP Location: Left Arm, Patient Position: Sitting, Cuff Size: Large)   Pulse 73   Ht 5\' 7"  (1.702 m)   Wt 223 lb 3.2 oz (101.2 kg)   SpO2 97%   BMI 34.96 kg/m    Review of Systems He denies hypoglycemia.      Objective:   Physical Exam VITAL SIGNS:  See vs page GENERAL: no distress Pulses: dorsalis pedis intact bilat.   MSK: no deformity of the feet CV: trace bilat leg edema Skin:  no ulcer on the feet.  normal color and temp on the feet. Neuro: sensation is intact to touch on the feet Ext: there is bilateral onychomycosis of the toenails  Lab Results  Component Value Date   CREATININE 2.00 (H) 04/06/2018   BUN 28 (H) 04/06/2018   NA 139 04/06/2018   K 4.0 04/06/2018   CL 103 04/06/2018   CO2 28 04/06/2018    Lab Results  Component Value Date   HGBA1C 7.4 (A) 03/08/2019      Assessment & Plan:  HTN: is noted today Type 1 DM: this is the best control this pt should aim for, given advanced age and lack of cbg record Renal insuf:  in this setting, he needs mostly mealtime insulin Diaphoresis: this may represent hypoglycemia.    Patient Instructions  Your blood pressure is high today.  Please see your primary care provider soon, to have it rechecked.   check your blood sugar twice a day.  vary the time of day when you check, between before the  3 meals, and at bedtime.  also check if you have symptoms of your blood sugar being too high or too low.  please keep a record of the readings and bring it to your next appointment here (or you can bring the meter itself).   You can write it on any piece of paper.  please call us sooner if your blood sugar goes below 70, or if you have a lot of readings over 200.  Please continue the same insulins.   Please come back for a follow-up appointment in 4 months.

## 2019-03-14 DIAGNOSIS — E1129 Type 2 diabetes mellitus with other diabetic kidney complication: Secondary | ICD-10-CM | POA: Diagnosis not present

## 2019-03-14 DIAGNOSIS — N183 Chronic kidney disease, stage 3 (moderate): Secondary | ICD-10-CM | POA: Diagnosis not present

## 2019-03-18 DIAGNOSIS — E669 Obesity, unspecified: Secondary | ICD-10-CM | POA: Diagnosis not present

## 2019-03-18 DIAGNOSIS — N183 Chronic kidney disease, stage 3 (moderate): Secondary | ICD-10-CM | POA: Diagnosis not present

## 2019-03-18 DIAGNOSIS — N281 Cyst of kidney, acquired: Secondary | ICD-10-CM | POA: Diagnosis not present

## 2019-03-18 DIAGNOSIS — E559 Vitamin D deficiency, unspecified: Secondary | ICD-10-CM | POA: Diagnosis not present

## 2019-03-18 DIAGNOSIS — I129 Hypertensive chronic kidney disease with stage 1 through stage 4 chronic kidney disease, or unspecified chronic kidney disease: Secondary | ICD-10-CM | POA: Diagnosis not present

## 2019-04-24 DIAGNOSIS — H52223 Regular astigmatism, bilateral: Secondary | ICD-10-CM | POA: Diagnosis not present

## 2019-04-24 DIAGNOSIS — H35033 Hypertensive retinopathy, bilateral: Secondary | ICD-10-CM | POA: Diagnosis not present

## 2019-04-24 DIAGNOSIS — H5203 Hypermetropia, bilateral: Secondary | ICD-10-CM | POA: Diagnosis not present

## 2019-04-24 DIAGNOSIS — E119 Type 2 diabetes mellitus without complications: Secondary | ICD-10-CM | POA: Diagnosis not present

## 2019-04-24 LAB — HM DIABETES EYE EXAM

## 2019-06-03 ENCOUNTER — Other Ambulatory Visit: Payer: Self-pay | Admitting: Family Medicine

## 2019-06-12 ENCOUNTER — Other Ambulatory Visit: Payer: Self-pay | Admitting: Family Medicine

## 2019-06-12 NOTE — Telephone Encounter (Signed)
Last OV 11/30/18 LAST FILL 01/22/19  #90/1

## 2019-06-19 ENCOUNTER — Telehealth: Payer: Self-pay | Admitting: Family Medicine

## 2019-06-19 NOTE — Telephone Encounter (Unsigned)
Copied from [place] [phone]. Topic: Quick Communication - Rx Refill/Question >> Jun 19, 2019  5:57 PM Mcneil, Ja-Kwan wrote: Medication: hydrALAZINE (APRESOLINE) 50 MG tablet  Has the patient contacted their pharmacy? yes   Preferred Pharmacy (with phone number or street name): [facility], [place] Phone: [phone] Fax: [phone]  Agent: Please be advised that RX refills may take up to 3 business days. We ask that you follow-up with your pharmacy.

## 2019-06-20 MED ORDER — HYDRALAZINE HCL 50 MG PO TABS: 50.0000 mg | ORAL_TABLET | Freq: Three times a day (TID) | ORAL | 3 refills | Status: DC

## 2019-06-20 NOTE — Telephone Encounter (Signed)
Rx refilled as requested.  

## 2019-06-20 NOTE — Telephone Encounter (Signed)
Routed to wrong office 

## 2019-06-25 ENCOUNTER — Telehealth: Payer: Self-pay | Admitting: Family Medicine

## 2019-06-25 ENCOUNTER — Other Ambulatory Visit: Payer: Self-pay | Admitting: Family Medicine

## 2019-06-25 MED ORDER — HYDRALAZINE HCL 50 MG PO TABS: 50.0000 mg | ORAL_TABLET | Freq: Three times a day (TID) | ORAL | 0 refills | Status: DC

## 2019-06-25 NOTE — Telephone Encounter (Signed)
Pt called stating he will not be able to receive his mail order medication for hydralazine until tomorrow or Thursday. Pt ask if Dr. Zigmund Daniel can send over a 7 day supply to [facility] location until his medication can come in.

## 2019-06-25 NOTE — Telephone Encounter (Signed)
completed

## 2019-06-26 ENCOUNTER — Telehealth: Payer: Self-pay

## 2019-06-26 NOTE — Telephone Encounter (Signed)
Patient called in stating he has in a sinus infection and doesn't know what kind of medication he can take that doesn't interfere with blood pressure and diabetes mess up      Please call and advise

## 2019-06-26 NOTE — Telephone Encounter (Signed)
Called pt and informed of Dr. Cordelia Pen response below. Using closed-loop communication, pt verbalized complete acceptance and understanding of all information provided. No further questions nor concerns were voiced at this time.

## 2019-06-26 NOTE — Telephone Encounter (Signed)
Please accept rx from Dr Zigmund Daniel.  Don't worry about effect on glucose.

## 2019-06-26 NOTE — Telephone Encounter (Signed)
Please review and advise.

## 2019-07-08 ENCOUNTER — Other Ambulatory Visit: Payer: Self-pay

## 2019-07-10 ENCOUNTER — Ambulatory Visit (INDEPENDENT_AMBULATORY_CARE_PROVIDER_SITE_OTHER): Payer: Medicare HMO | Admitting: Endocrinology

## 2019-07-10 ENCOUNTER — Encounter: Payer: Self-pay | Admitting: Endocrinology

## 2019-07-10 VITALS — BP 140/80 | HR 100 | Ht 67.0 in | Wt 222.4 lb

## 2019-07-10 DIAGNOSIS — E119 Type 2 diabetes mellitus without complications: Secondary | ICD-10-CM

## 2019-07-10 DIAGNOSIS — M79671 Pain in right foot: Secondary | ICD-10-CM | POA: Insufficient documentation

## 2019-07-10 DIAGNOSIS — E1029 Type 1 diabetes mellitus with other diabetic kidney complication: Secondary | ICD-10-CM | POA: Diagnosis not present

## 2019-07-10 LAB — POCT GLYCOSYLATED HEMOGLOBIN (HGB A1C): Hemoglobin A1C: 8.4 % — AB (ref 4.0–5.6)

## 2019-07-10 MED ORDER — INSULIN NPH (HUMAN) (ISOPHANE) 100 UNIT/ML ~~LOC~~ SUSP: 35.0000 [IU] | Freq: Every day | SUBCUTANEOUS | 11 refills | Status: DC

## 2019-07-10 NOTE — Progress Notes (Signed)
Subjective:    Patient ID: John Fry, male    DOB: [birth year], 79 y.o.   MRN: [id number]  HPI Pt returns for f/u of diabetes mellitus: DM type: 1 Dx'ed: 5956 Complications: renal insuff  Therapy: insulin since dx DKA: only at dx.   Severe hypoglycemia: never.  Pancreatitis: never.   SDOH: he takes human insulin, due to cost.   Other: he takes multiple daily injections.   Interval history: pt says he never misses the insulin.  no cbg record, but states cbg's vary from 95-170.  It is in general highest fasting. pt states he feels well in general.   Past Medical History:  Diagnosis Date  . Diabetes mellitus without complication ([place])   . DYSLIPIDEMIA 03/05/2007  . Helicobacter pylori (H. pylori) 09/25/2013  . HERNIA 03/05/2007  . HYPERTENSION 03/05/2007  . 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 RENAL INSUFFICIENCY 05/22/2008  . S/P radiation therapy 04/14/2014 through 06/11/2014                                                      Prostate 7800 cGy in 40 sessions, seminal vesicles 5600 cGy in 40 sessions                        . SHOULDER PAIN, RIGHT 05/08/2008  . 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 FOOT SURGERY  1999   Right Foot Bunion Repair  . Lower Arterial  05/06/2003  . PROSTATE BIOPSY  07/23/13   gleason 7, volume 53 ml  . remove fatty tumor Left 1995   thigh    Social History   Socioeconomic History  . Marital status: Married    Spouse name: Not on file  . Number of children: Not on file  . Years of education: Not on file  . Highest education level: Not on file  Occupational History    Comment: Retired  Tobacco Use  . Smoking status: Former Smoker    Packs/day: 0.50    Years: 20.00    Pack years: 10.00    Quit date: 06/20/2006    Years since quitting: 13.0  . Smokeless tobacco: Never Used  Substance and Sexual Activity  . Alcohol use: No    Alcohol/week: 0.0 standard  drinks  . Drug use: No  . Sexual activity: Not on file  Other Topics Concern  . Not on file  Social History Narrative  . Not on file   Social Determinants of Health   Financial Resource Strain:   . Difficulty of Paying Living Expenses: Not on file  Food Insecurity:   . Worried About Charity fundraiser in the Last Year: Not on file  . Ran Out of Food in the Last Year: Not on file  Transportation Needs:   . Lack of Transportation (Medical): Not on file  . Lack of Transportation (Non-Medical): Not on file  Physical Activity:   . Days of Exercise per Week: Not on file  . Minutes of Exercise per Session: Not on file  Stress:   . Feeling of Stress : Not on file  Social Connections:   . Frequency of Communication with Friends and Family: Not on file  . Frequency of Social  Gatherings with Friends and Family: Not on file  . Attends Religious Services: Not on file  . Active Member of Clubs or Organizations: Not on file  . Attends Archivist Meetings: Not on file  . Marital Status: Not on file  Intimate Partner Violence:   . Fear of Current or Ex-Partner: Not on file  . Emotionally Abused: Not on file  . Physically Abused: Not on file  . Sexually Abused: Not on file    Current Outpatient Medications on File Prior to Visit  Medication Sig Dispense Refill  . Alcohol Swabs (B-D SINGLE USE SWABS REGULAR) PADS USE  DAILY 200 each 0  . amLODipine (NORVASC) 10 MG tablet Take 1 tablet by mouth once daily 90 tablet 0  . atorvastatin (LIPITOR) 40 MG tablet TAKE 1 TABLET EVERY DAY 90 tablet 1  . hydrochlorothiazide (HYDRODIURIL) 25 MG tablet Take 1 tablet by mouth once daily 90 tablet 0  . insulin regular (HUMULIN R) 100 units/mL injection Inject 0.25 mLs (25 Units total) into the skin 2 (two) times daily before a meal. 20 mL 11  . TRUE METRIX BLOOD GLUCOSE TEST test strip CHECK BLOOD SUGAR TWICE DAILY  ( APPOINTMENT IS NEEDED  FOR  FURTHER  REFILLS) 200 each 3  . TRUEPLUS LANCETS  30G MISC USE  TO CHECK BLOOD SUGAR TWICE DAILY 200 each 0  . hydrALAZINE (APRESOLINE) 50 MG tablet Take 1 tablet (50 mg total) by mouth 3 (three) times daily for 7 days. 21 tablet 0   No current facility-administered medications on file prior to visit.    Allergies  Allergen Reactions  . Zestril [Lisinopril]     angioedema    Family History  Problem Relation Age of Onset  . Cancer Father 84       prostate  . Kidney Stones Brother   . Diabetes Sister   . Hypertension Neg Hx   . Colon cancer Neg Hx     BP 140/80 (BP Location: Left Arm, Patient Position: Sitting, Cuff Size: Large)   Pulse 100   Ht 5\' 7"  (1.702 m)   Wt 222 lb 6.4 oz (100.9 kg)   SpO2 99%   BMI 34.83 kg/m    Review of Systems He denies hypoglycemia.  He has right foot pain (no injury).      Objective:   Physical Exam VITAL SIGNS:  See vs page GENERAL: no distress Pulses: dorsalis pedis intact bilat.   MSK: no deformity of the feet CV: no leg edema Skin:  no ulcer on the feet.  normal color and temp on the feet. Neuro: sensation is intact to touch on the feet.   Ext: there is bilateral onychomycosis of the toenails.    Lab Results  Component Value Date   HGBA1C 8.4 (A) 07/10/2019   Lab Results  Component Value Date   CREATININE 2.00 (H) 04/06/2018   BUN 28 (H) 04/06/2018   NA 139 04/06/2018   K 4.0 04/06/2018   CL 103 04/06/2018   CO2 28 04/06/2018       Assessment & Plan:  Type 1 DM, with renal insuff: worse.  advanced age: in this setting, we'll increase insulin slowly HTN: recheck next time.   Patient Instructions  Please increase the NPH insulin to 35 units. Please continue the same Reg insulin. check your blood sugar twice a day.  vary the time of day when you check, between before the 3 meals, and at bedtime.  also check if you have  symptoms of your blood sugar being too high or too low.  please keep a record of the readings and bring it to your next appointment here (or you can  bring the meter itself).  You can write it on any piece of paper.  please call us sooner if your blood sugar goes below 70, or if you have a lot of readings over 200. Please see a foot specialist.  you will receive a phone call, about a day and time for an appointment Please come back for a follow-up appointment in 2 months.

## 2019-07-10 NOTE — Patient Instructions (Addendum)
Please increase the NPH insulin to 35 units. Please continue the same Reg insulin. check your blood sugar twice a day.  vary the time of day when you check, between before the 3 meals, and at bedtime.  also check if you have symptoms of your blood sugar being too high or too low.  please keep a record of the readings and bring it to your next appointment here (or you can bring the meter itself).  You can write it on any piece of paper.  please call us sooner if your blood sugar goes below 70, or if you have a lot of readings over 200. Please see a foot specialist.  you will receive a phone call, about a day and time for an appointment Please come back for a follow-up appointment in 2 months.

## 2019-07-16 ENCOUNTER — Ambulatory Visit: Payer: Medicare HMO | Admitting: Sports Medicine

## 2019-07-16 ENCOUNTER — Other Ambulatory Visit: Payer: Self-pay | Admitting: Podiatry

## 2019-07-16 ENCOUNTER — Other Ambulatory Visit: Payer: Self-pay

## 2019-07-16 ENCOUNTER — Other Ambulatory Visit: Payer: Self-pay | Admitting: Sports Medicine

## 2019-07-16 ENCOUNTER — Encounter: Payer: Self-pay | Admitting: Sports Medicine

## 2019-07-16 ENCOUNTER — Ambulatory Visit (INDEPENDENT_AMBULATORY_CARE_PROVIDER_SITE_OTHER): Payer: Medicare HMO

## 2019-07-16 VITALS — BP 145/81 | HR 77 | Temp 97.3°F

## 2019-07-16 DIAGNOSIS — Q663 Other congenital varus deformities of feet, unspecified foot: Secondary | ICD-10-CM | POA: Diagnosis not present

## 2019-07-16 DIAGNOSIS — R208 Other disturbances of skin sensation: Secondary | ICD-10-CM

## 2019-07-16 DIAGNOSIS — M779 Enthesopathy, unspecified: Secondary | ICD-10-CM

## 2019-07-16 DIAGNOSIS — M19079 Primary osteoarthritis, unspecified ankle and foot: Secondary | ICD-10-CM

## 2019-07-16 DIAGNOSIS — R0989 Other specified symptoms and signs involving the circulatory and respiratory systems: Secondary | ICD-10-CM

## 2019-07-16 DIAGNOSIS — M19071 Primary osteoarthritis, right ankle and foot: Secondary | ICD-10-CM | POA: Diagnosis not present

## 2019-07-16 DIAGNOSIS — M778 Other enthesopathies, not elsewhere classified: Secondary | ICD-10-CM | POA: Diagnosis not present

## 2019-07-16 DIAGNOSIS — M79671 Pain in right foot: Secondary | ICD-10-CM

## 2019-07-16 NOTE — Progress Notes (Signed)
Subjective: John Fry is a 79 y.o. male patient who presents to office for evaluation of right foot pain.  Patient reports that it does not hurt however he is concerned more so about the little knot and how his foot is turning in it feels like his bone is pushing against the skin as well as he feels as if his entire limb is always cold.  Patient reports that he talk to his primary doctor about it who said it may have been a pinched nerve. Denies injury/trip/fall/sprain/any causative factors.   Review of Systems  All other systems reviewed and are negative.    Patient Active Problem List   Diagnosis Date Noted  . Foot pain, right 07/10/2019  . CKD (chronic kidney disease) 05/31/2018  . Knee pain 01/29/2015  . Vitamin D deficiency 01/29/2015  . Hyperparathyroidism ([place]) 10/09/2014  . Diabetes ([place]) 04/29/2014  . Prostate cancer ([place])   . Elevated PSA 04/04/2013  . VENTRICULAR HYPERTROPHY, LEFT 05/08/2008  . ABNORMAL ELECTROCARDIOGRAM 04/28/2008  . Dyslipidemia 03/05/2007  . HTN (hypertension) 03/05/2007  . Abdominal hernia 03/05/2007    Current Outpatient Medications on File Prior to Visit  Medication Sig Dispense Refill  . Alcohol Swabs (B-D SINGLE USE SWABS REGULAR) PADS USE  DAILY 200 each 0  . amLODipine (NORVASC) 10 MG tablet Take 1 tablet by mouth once daily 90 tablet 0  . atorvastatin (LIPITOR) 40 MG tablet TAKE 1 TABLET EVERY DAY 90 tablet 1  . hydrochlorothiazide (HYDRODIURIL) 25 MG tablet Take 1 tablet by mouth once daily 90 tablet 0  . insulin NPH Human (NOVOLIN N) 100 UNIT/ML injection Inject 0.35 mLs (35 Units total) into the skin at bedtime. 20 mL 11  . insulin regular (HUMULIN R) 100 units/mL injection Inject 0.25 mLs (25 Units total) into the skin 2 (two) times daily before a meal. 20 mL 11  . TRUE METRIX BLOOD GLUCOSE TEST test strip CHECK BLOOD SUGAR TWICE DAILY  ( APPOINTMENT IS NEEDED  FOR  FURTHER  REFILLS) 200 each 3  . TRUEPLUS LANCETS 30G MISC USE   TO CHECK BLOOD SUGAR TWICE DAILY 200 each 0  . hydrALAZINE (APRESOLINE) 50 MG tablet Take 1 tablet (50 mg total) by mouth 3 (three) times daily for 7 days. 21 tablet 0   No current facility-administered medications on file prior to visit.    Allergies  Allergen Reactions  . Zestril [Lisinopril]     angioedema    Objective:  General: Alert and oriented x3 in no acute distress  Dermatology: No open lesions bilateral lower extremities, no webspace macerations, no ecchymosis bilateral, all nails x 10 are well manicured.  Vascular: Dorsalis Pedis and Posterior Tibial pedal pulses palpable, Capillary Fill Time 3 seconds,(+) pedal hair growth bilateral, no edema bilateral lower extremities, Temperature gradient within normal limits.  Neurology: Johney Maine sensation intact via light touch bilateral.  Musculoskeletal: No reproducible tenderness bilateral however there is varus foot deformity noted right greater than left with prominence of fifth metatarsal base.  Subjective: Sensation is running up the leg right greater than left.  Strength within normal limits in all groups bilateral.   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  Assessment and Plan: Problem List Items Addressed This Visit    None    Visit Diagnoses    Right foot pain    -  Primary   Relevant Orders  DG Foot Complete Right   Capsulitis       Cold foot without peripheral vascular disease       Decreased pedal pulses       ABI screening normal   Varus deformity of foot       Arthritis of foot           -Complete examination performed -Xrays reviewed -Discussed treatment options -ABIs normal in office -Advised patient to continue with good supportive shoes may benefit from custom orthotic that would prevent him from supinating and help to control the varus foot deformity that he has;  office to call patient to discuss cost of orthotics -Recommend patient to follow-up with PCP regarding possible iron and thyroid issues that could be adding to the cold sensation that he is having on his lower extremity especially right -Patient to return to office as needed or sooner if condition worsens.  Landis Martins, DPM

## 2019-07-16 NOTE — Patient Instructions (Signed)
Recommend PCP to check: Iron and Thyroid

## 2019-08-30 ENCOUNTER — Ambulatory Visit (HOSPITAL_COMMUNITY)
Admission: EM | Admit: 2019-08-30 | Discharge: 2019-08-30 | Disposition: A | Discharge status: Home / Self Care | Payer: Medicare HMO

## 2019-08-30 ENCOUNTER — Other Ambulatory Visit: Payer: Self-pay

## 2019-08-30 ENCOUNTER — Encounter (HOSPITAL_COMMUNITY): Payer: Self-pay

## 2019-08-30 DIAGNOSIS — J019 Acute sinusitis, unspecified: Secondary | ICD-10-CM

## 2019-08-30 MED ORDER — METHYLPREDNISOLONE SODIUM SUCC 125 MG IJ SOLR
INTRAMUSCULAR | Status: AC
  Filled 2019-08-30: qty 2

## 2019-08-30 MED ORDER — METHYLPREDNISOLONE SODIUM SUCC 125 MG IJ SOLR
60.0000 mg | Freq: Once | INTRAMUSCULAR | Status: AC
  Administered 2019-08-30: 60 mg via INTRAMUSCULAR

## 2019-08-30 MED ORDER — FLUTICASONE PROPIONATE 50 MCG/ACT NA SUSP: 1.0000 | Freq: Every day | NASAL | 2 refills | Status: AC

## 2019-08-30 MED ORDER — METHYLPREDNISOLONE SODIUM SUCC 125 MG IJ SOLR: 60.0000 mg | Freq: Once | INTRAMUSCULAR | Status: DC

## 2019-08-30 MED ORDER — DEXAMETHASONE SODIUM PHOSPHATE 10 MG/ML IJ SOLN: 10.0000 mg | Freq: Once | INTRAMUSCULAR | Status: DC

## 2019-08-30 NOTE — Discharge Instructions (Addendum)
Steroid injection given here  Sent Flonase to the pharmacy.  Follow up as needed for continued or worsening symptoms

## 2019-08-30 NOTE — ED Provider Notes (Signed)
[facility]    CSN: [id number] Arrival date & time: 08/30/19  1300      History   Chief Complaint Chief Complaint  Patient presents with  . Sinus Problem    HPI John Fry is a 79 y.o. male.   Patient is a 78 year old male presents today with sinus congestion.  Symptoms have been constant for the past couple days.  He is taking Sudafed with minimal relief.  He is also been using nasal spray without much relief.  Denies any associated fever, chills, body aches, night sweats, cough.  He has had mild left frontal headache.  ROS per HPI      Past Medical History:  Diagnosis Date  . Diabetes mellitus without complication ([place])   . DYSLIPIDEMIA 03/05/2007  . Helicobacter pylori (H. pylori) 09/25/2013  . HERNIA 03/05/2007  . HYPERTENSION 03/05/2007  . 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 RENAL INSUFFICIENCY 05/22/2008  . S/P radiation therapy 04/14/2014 through 06/11/2014                                                      Prostate 7800 cGy in 40 sessions, seminal vesicles 5600 cGy in 40 sessions                        . SHOULDER PAIN, RIGHT 05/08/2008  . VENTRICULAR HYPERTROPHY, LEFT 05/08/2008    Patient Active Problem List   Diagnosis Date Noted  . Foot pain, right 07/10/2019  . CKD (chronic kidney disease) 05/31/2018  . Knee pain 01/29/2015  . Vitamin D deficiency 01/29/2015  . Hyperparathyroidism ([place]) 10/09/2014  . Diabetes ([place]) 04/29/2014  . Prostate cancer ([place])   . Elevated PSA 04/04/2013  . VENTRICULAR HYPERTROPHY, LEFT 05/08/2008  . ABNORMAL ELECTROCARDIOGRAM 04/28/2008  . Dyslipidemia 03/05/2007  . HTN (hypertension) 03/05/2007  . Abdominal hernia 03/05/2007    Past Surgical History:  Procedure Laterality Date  . FOOT SURGERY  1999   Right Foot Bunion Repair  . Lower Arterial  05/06/2003  . PROSTATE BIOPSY  07/23/13   gleason 7, volume 53 ml  . remove fatty tumor Left 1995   thigh       Home  Medications    Prior to Admission medications   Medication Sig Start Date End Date Taking? Authorizing Provider  Alcohol Swabs (B-D SINGLE USE SWABS REGULAR) PADS USE  DAILY 01/17/17   Renato Shin, MD  amLODipine (NORVASC) 10 MG tablet Take 1 tablet by mouth once daily 06/03/19   Luetta Nutting, DO  atorvastatin (LIPITOR) 40 MG tablet TAKE 1 TABLET EVERY DAY 06/12/19   Luetta Nutting, DO  fluticasone (FLONASE) 50 MCG/ACT nasal spray Place 1 spray into both nostrils daily. 08/30/19   Loura Halt A, NP  hydrALAZINE (APRESOLINE) 50 MG tablet Take 1 tablet (50 mg total) by mouth 3 (three) times daily for 7 days. 06/25/19 07/02/19  Luetta Nutting, DO  hydrochlorothiazide (HYDRODIURIL) 25 MG tablet Take 1 tablet by mouth once daily 06/03/19   Luetta Nutting, DO  insulin NPH Human (NOVOLIN N) 100 UNIT/ML injection Inject 0.35 mLs (35 Units total) into the skin at bedtime. 07/10/19   Renato Shin, MD  insulin regular (HUMULIN R) 100 units/mL injection Inject 0.25 mLs (25 Units total) into the skin 2 (  two) times daily before a meal. 03/15/17   Renato Shin, MD  sildenafil (VIAGRA) 100 MG tablet Take 100 mg by mouth daily as needed. 05/04/19   [provider]  TRUE METRIX BLOOD GLUCOSE TEST test strip CHECK BLOOD SUGAR TWICE DAILY  ( APPOINTMENT IS NEEDED  FOR  FURTHER  REFILLS) 11/24/18   Renato Shin, MD  TRUEPLUS LANCETS 30G MISC USE  TO CHECK BLOOD SUGAR TWICE DAILY 09/05/16   Renato Shin, MD    Family History Family History  Problem Relation Age of Onset  . Cancer Father 55       prostate  . Kidney Stones Brother   . Diabetes Sister   . Hypertension Neg Hx   . Colon cancer Neg Hx     Social History Social History   Tobacco Use  . Smoking status: Former Smoker    Packs/day: 0.50    Years: 20.00    Pack years: 10.00    Quit date: 06/20/2006    Years since quitting: 13.2  . Smokeless tobacco: Never Used  Substance Use Topics  . Alcohol use: No    Alcohol/week: 0.0 standard drinks   . Drug use: No     Allergies   Zestril [lisinopril]   Review of Systems Review of Systems   Physical Exam Triage Vital Signs ED Triage Vitals  Enc Vitals Group     BP 08/30/19 1332 137/67     Pulse Rate 08/30/19 1332 73     Resp 08/30/19 1332 19     Temp 08/30/19 1332 98.2 F (36.8 C)     Temp Source 08/30/19 1332 Oral     SpO2 08/30/19 1332 99 %     Weight 08/30/19 1330 222 lb (100.7 kg)     Height --      Head Circumference --      Peak Flow --      Pain Score 08/30/19 1330 3     Pain Loc --      Pain Edu? --      Excl. in [place]? --    No data found.  Updated Vital Signs BP 137/67 (BP Location: Right Arm)   Pulse 73   Temp 98.2 F (36.8 C) (Oral)   Resp 19   Wt 222 lb (100.7 kg)   SpO2 99%   BMI 34.77 kg/m   Visual Acuity Right Eye Distance:   Left Eye Distance:   Bilateral Distance:    Right Eye Near:   Left Eye Near:    Bilateral Near:     Physical Exam Vitals and nursing note reviewed.  Constitutional:      Appearance: Normal appearance.  HENT:     Head: Normocephalic and atraumatic.     Nose: Congestion present.     Comments: Left nasal turbinate swelling and erythema  Eyes:     Conjunctiva/sclera: Conjunctivae normal.  Pulmonary:     Effort: Pulmonary effort is normal.  Musculoskeletal:        General: Normal range of motion.     Cervical back: Normal range of motion.  Skin:    General: Skin is warm and dry.  Neurological:     Mental Status: He is alert.  Psychiatric:        Mood and Affect: Mood normal.      UC Treatments / Results  Labs (all labs ordered are listed, but only abnormal results are displayed) Labs Reviewed - No data to display  EKG   Radiology No  results found.  Procedures Procedures (including critical care time)  Medications Ordered in UC Medications  methylPREDNISolone sodium succinate (SOLU-MEDROL) 125 mg/2 mL injection 60 mg (60 mg Intramuscular Given 08/30/19 1415)    Initial Impression /  Assessment and Plan / UC Course  I have reviewed the triage vital signs and the nursing notes.  Pertinent labs & imaging results that were available during my care of the patient were reviewed by me and considered in my medical decision making (see chart for details).    Sinusitis-steroid injection given here in clinic for nasal swelling and inflammation Flonase nasal spray sent to pharmacy to use daily Follow up as needed for continued or worsening symptoms  Final Clinical Impressions(s) / UC Diagnoses   Final diagnoses:  Acute non-recurrent sinusitis, unspecified location     Discharge Instructions     Steroid injection given here  Sent Flonase to the pharmacy.  Follow up as needed for continued or worsening symptoms     ED Prescriptions    Medication Sig Dispense Auth. Provider   fluticasone (FLONASE) 50 MCG/ACT nasal spray Place 1 spray into both nostrils daily. 16 g Loura Halt A, NP     PDMP not reviewed this encounter.   Orvan July, NP 09/02/19 [phone]

## 2019-08-30 NOTE — ED Triage Notes (Signed)
Pt is here with sinus pressure that started Tuesday, pt has taken Sudafed to relieve discomfort.

## 2019-09-02 ENCOUNTER — Other Ambulatory Visit: Payer: Self-pay | Admitting: Endocrinology

## 2019-09-05 ENCOUNTER — Other Ambulatory Visit: Payer: Self-pay

## 2019-09-06 ENCOUNTER — Other Ambulatory Visit: Payer: Self-pay | Admitting: Family Medicine

## 2019-09-06 ENCOUNTER — Other Ambulatory Visit: Payer: Self-pay

## 2019-09-09 ENCOUNTER — Ambulatory Visit (INDEPENDENT_AMBULATORY_CARE_PROVIDER_SITE_OTHER): Payer: Medicare HMO | Admitting: Endocrinology

## 2019-09-09 ENCOUNTER — Other Ambulatory Visit: Payer: Self-pay

## 2019-09-09 ENCOUNTER — Encounter: Payer: Self-pay | Admitting: Endocrinology

## 2019-09-09 VITALS — BP 140/72 | HR 81 | Ht 67.0 in | Wt 224.0 lb

## 2019-09-09 DIAGNOSIS — E1029 Type 1 diabetes mellitus with other diabetic kidney complication: Secondary | ICD-10-CM

## 2019-09-09 DIAGNOSIS — E119 Type 2 diabetes mellitus without complications: Secondary | ICD-10-CM

## 2019-09-09 LAB — POCT GLYCOSYLATED HEMOGLOBIN (HGB A1C): Hemoglobin A1C: 8.1 % — AB (ref 4.0–5.6)

## 2019-09-09 MED ORDER — INSULIN NPH (HUMAN) (ISOPHANE) 100 UNIT/ML ~~LOC~~ SUSP: 40.0000 [IU] | Freq: Every day | SUBCUTANEOUS | 11 refills | Status: DC

## 2019-09-09 NOTE — Patient Instructions (Addendum)
Your blood pressure is high today.  Please see your primary care provider soon, to have it rechecked Please increase the NPH insulin to 40 units at bedtime. Please continue the same Reg insulin. check your blood sugar twice a day.  vary the time of day when you check, between before the 3 meals, and at bedtime.  also check if you have symptoms of your blood sugar being too high or too low.  please keep a record of the readings and bring it to your next appointment here (or you can bring the meter itself).  You can write it on any piece of paper.  please call us sooner if your blood sugar goes below 70, or if you have a lot of readings over 200.   Please come back for a follow-up appointment in 2 months.

## 2019-09-09 NOTE — Progress Notes (Signed)
Subjective:    Patient ID: John Fry, male    DOB: 05/24/1941, 79 y.o.   MRN: [id number]  HPI Pt returns for f/u of diabetes mellitus: DM type: 1 Dx'ed: 6962 Complications: renal insuff  Therapy: insulin since dx DKA: only at dx.   Severe hypoglycemia: never.  Pancreatitis: never.   SDOH: he takes human insulin, due to cost.   Other: he takes multiple daily injections.   Interval history: pt says he never misses the insulin.  no cbg record, but states cbg's vary from 94-170.  It is in general lowest in the afternoon, and highest fasting.  pt states he feels well in general.   Past Medical History:  Diagnosis Date  . Diabetes mellitus without complication ([place])   . DYSLIPIDEMIA 03/05/2007  . Helicobacter pylori (H. pylori) 09/25/2013  . HERNIA 03/05/2007  . HYPERTENSION 03/05/2007  . 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 RENAL INSUFFICIENCY 05/22/2008  . S/P radiation therapy 04/14/2014 through 06/11/2014                                                      Prostate 7800 cGy in 40 sessions, seminal vesicles 5600 cGy in 40 sessions                        . SHOULDER PAIN, RIGHT 05/08/2008  . 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 FOOT SURGERY  1999   Right Foot Bunion Repair  . Lower Arterial  05/06/2003  . PROSTATE BIOPSY  07/23/13   gleason 7, volume 53 ml  . remove fatty tumor Left 1995   thigh    Social History   Socioeconomic History  . Marital status: Married    Spouse name: Not on file  . Number of children: Not on file  . Years of education: Not on file  . Highest education level: Not on file  Occupational History    Comment: Retired  Tobacco Use  . Smoking status: Former Smoker    Packs/day: 0.50    Years: 20.00    Pack years: 10.00    Quit date: 06/20/2006    Years since quitting: 13.2  . Smokeless tobacco: Never Used  Substance and Sexual Activity  . Alcohol use: No      Alcohol/week: 0.0 standard drinks  . Drug use: No  . Sexual activity: Not Currently  Other Topics Concern  . Not on file  Social History Narrative  . Not on file   Social Determinants of Health   Financial Resource Strain:   . Difficulty of Paying Living Expenses:   Food Insecurity:   . Worried About Charity fundraiser in the Last Year:   . Arboriculturist in the Last Year:   Transportation Needs:   . Film/video editor (Medical):   Marland Kitchen Lack of Transportation (Non-Medical):   Physical Activity:   . Days of Exercise per Week:   . Minutes of Exercise per Session:   Stress:   . Feeling of Stress :   Social Connections:   . Frequency of Communication with Friends and Family:   . Frequency of Social Gatherings with Friends and Family:   . Attends Religious Services:   .  Active Member of Clubs or Organizations:   . Attends Archivist Meetings:   Marland Kitchen Marital Status:   Intimate Partner Violence:   . Fear of Current or Ex-Partner:   . Emotionally Abused:   Marland Kitchen Physically Abused:   . Sexually Abused:     Current Outpatient Medications on File Prior to Visit  Medication Sig Dispense Refill  . Alcohol Swabs (B-D SINGLE USE SWABS REGULAR) PADS USE  DAILY 200 each 0  . amLODipine (NORVASC) 10 MG tablet Take 1 tablet by mouth once daily 90 tablet 0  . atorvastatin (LIPITOR) 40 MG tablet TAKE 1 TABLET EVERY DAY 90 tablet 1  . fluticasone (FLONASE) 50 MCG/ACT nasal spray Place 1 spray into both nostrils daily. 16 g 2  . hydrochlorothiazide (HYDRODIURIL) 25 MG tablet Take 1 tablet by mouth once daily 90 tablet 0  . insulin regular (HUMULIN R) 100 units/mL injection Inject 0.25 mLs (25 Units total) into the skin 2 (two) times daily before a meal. 20 mL 11  . sildenafil (VIAGRA) 100 MG tablet Take 100 mg by mouth daily as needed.    . TRUE METRIX BLOOD GLUCOSE TEST test strip CHECK BLOOD SUGAR TWICE DAILY  ( APPOINTMENT IS NEEDED  FOR  FURTHER  REFILLS) 200 strip 0  . TRUEPLUS  LANCETS 30G MISC USE  TO CHECK BLOOD SUGAR TWICE DAILY 200 each 0  . hydrALAZINE (APRESOLINE) 50 MG tablet Take 1 tablet (50 mg total) by mouth 3 (three) times daily for 7 days. 21 tablet 0   No current facility-administered medications on file prior to visit.    Allergies  Allergen Reactions  . Zestril [Lisinopril]     angioedema    Family History  Problem Relation Age of Onset  . Cancer Father 47       prostate  . Kidney Stones Brother   . Diabetes Sister   . Hypertension Neg Hx   . Colon cancer Neg Hx     BP 140/72   Pulse 81   Ht 5\' 7"  (1.702 m)   Wt 224 lb (101.6 kg)   SpO2 98%   BMI 35.08 kg/m    Review of Systems He denies hypoglycemia    Objective:   Physical Exam VITAL SIGNS:  See vs page GENERAL: no distress Pulses: dorsalis pedis intact bilat.   MSK: no deformity of the feet CV: no leg edema Skin:  no ulcer on the feet.  normal color and temp on the feet. Neuro: sensation is intact to touch on the feet.  Ext: there is bilateral onychomycosis of the toenails.    Lab Results  Component Value Date   HGBA1C 8.1 (A) 09/09/2019        Assessment & Plan:  HTN: is noted today Type 1 DM: he needs increased rx. Renal insuff: in this setting, he needs most of his daily insulin at mealtimes.  Patient Instructions  Your blood pressure is high today.  Please see your primary care provider soon, to have it rechecked Please increase the NPH insulin to 40 units at bedtime. Please continue the same Reg insulin. check your blood sugar twice a day.  vary the time of day when you check, between before the 3 meals, and at bedtime.  also check if you have symptoms of your blood sugar being too high or too low.  please keep a record of the readings and bring it to your next appointment here (or you can bring the meter itself).  You  can write it on any piece of paper.  please call us sooner if your blood sugar goes below 70, or if you have a lot of readings over 200.     Please come back for a follow-up appointment in 2 months.

## 2019-09-11 ENCOUNTER — Ambulatory Visit (INDEPENDENT_AMBULATORY_CARE_PROVIDER_SITE_OTHER): Payer: Medicare HMO | Admitting: Family Medicine

## 2019-09-11 ENCOUNTER — Other Ambulatory Visit: Payer: Self-pay

## 2019-09-11 ENCOUNTER — Encounter: Payer: Self-pay | Admitting: Family Medicine

## 2019-09-11 VITALS — BP 128/70 | HR 70 | Temp 97.9°F | Ht 67.0 in | Wt 224.8 lb

## 2019-09-11 DIAGNOSIS — I10 Essential (primary) hypertension: Secondary | ICD-10-CM

## 2019-09-11 DIAGNOSIS — N1832 Chronic kidney disease, stage 3b: Secondary | ICD-10-CM | POA: Diagnosis not present

## 2019-09-11 DIAGNOSIS — E785 Hyperlipidemia, unspecified: Secondary | ICD-10-CM | POA: Diagnosis not present

## 2019-09-11 LAB — URINALYSIS, ROUTINE W REFLEX MICROSCOPIC
Bilirubin Urine: NEGATIVE
Hgb urine dipstick: NEGATIVE
Ketones, ur: NEGATIVE
Leukocytes,Ua: NEGATIVE
Nitrite: NEGATIVE
RBC / HPF: NONE SEEN (ref 0–?)
Specific Gravity, Urine: 1.025 (ref 1.000–1.030)
Total Protein, Urine: NEGATIVE
Urine Glucose: NEGATIVE
Urobilinogen, UA: 0.2 (ref 0.0–1.0)
pH: 5.5 (ref 5.0–8.0)

## 2019-09-11 LAB — LIPID PANEL
Cholesterol: 135 mg/dL (ref 0–200)
HDL: 40.6 mg/dL (ref >39.00)
LDL Cholesterol: 71 mg/dL (ref 0–99)
NonHDL: 94.37
Total CHOL/HDL Ratio: 3
Triglycerides: 116 mg/dL (ref 0.0–149.0)
VLDL: 23.2 mg/dL (ref 0.0–40.0)

## 2019-09-11 LAB — CBC
HCT: 40 % (ref 39.0–52.0)
Hemoglobin: 13.5 g/dL (ref 13.0–17.0)
MCHC: 33.8 g/dL (ref 30.0–36.0)
MCV: 94.2 fl (ref 78.0–100.0)
Platelets: 213 10*3/uL (ref 150.0–400.0)
RBC: 4.25 Mil/uL (ref 4.22–5.81)
RDW: 14.3 % (ref 11.5–15.5)
WBC: 7.5 10*3/uL (ref 4.0–10.5)

## 2019-09-11 LAB — COMPREHENSIVE METABOLIC PANEL WITH GFR
ALT: 25 U/L (ref 0–53)
AST: 24 U/L (ref 0–37)
Albumin: 4.4 g/dL (ref 3.5–5.2)
Alkaline Phosphatase: 63 U/L (ref 39–117)
BUN: 18 mg/dL (ref 6–23)
CO2: 28 meq/L (ref 19–32)
Calcium: 9.4 mg/dL (ref 8.4–10.5)
Chloride: 104 meq/L (ref 96–112)
Creatinine, Ser: 1.61 mg/dL — ABNORMAL HIGH (ref 0.40–1.50)
GFR: 50.3 mL/min — ABNORMAL LOW
Glucose, Bld: 122 mg/dL — ABNORMAL HIGH (ref 70–99)
Potassium: 4.3 meq/L (ref 3.5–5.1)
Sodium: 139 meq/L (ref 135–145)
Total Bilirubin: 0.6 mg/dL (ref 0.2–1.2)
Total Protein: 6.8 g/dL (ref 6.0–8.3)

## 2019-09-11 NOTE — Progress Notes (Signed)
Established Patient Office Visit  Subjective:  Patient ID: John Fry, male    DOB: December 17, 1940  Age: 79 y.o. MRN: [id number]  CC:  Chief Complaint  Patient presents with  . Transitions Of Care    TOC from Dr. Zigmund Daniel, refill on medications no concerns.     HPI John Fry presents for follow-up of his hypertension, elevated cholesterol chronic kidney disease.  Type 2 diabetes being followed by endocrinology with a hemoglobin A1c measured 8.12 days ago.  Patient did see the ophthalmologist a few months ago and tells me there was no diabetic retinopathy.  He did also see the nephrologist.  Blood pressure has been well controlled on his current regimen.  Continues to take atorvastatin for his cholesterol.  He lives alone.  He has 2 nieces that are nurses in the hospital.  He is close friends with a sister-in-law.  He did have his first Covid vaccine a few weeks ago.  Past Medical History:  Diagnosis Date  . Diabetes mellitus without complication ([place])   . DYSLIPIDEMIA 03/05/2007  . Helicobacter pylori (H. pylori) 09/25/2013  . HERNIA 03/05/2007  . HYPERTENSION 03/05/2007  . 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 RENAL INSUFFICIENCY 05/22/2008  . S/P radiation therapy 04/14/2014 through 06/11/2014                                                      Prostate 7800 cGy in 40 sessions, seminal vesicles 5600 cGy in 40 sessions                        . SHOULDER PAIN, RIGHT 05/08/2008  . 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 FOOT SURGERY  1999   Right Foot Bunion Repair  . Lower Arterial  05/06/2003  . PROSTATE BIOPSY  07/23/13   gleason 7, volume 53 ml  . remove fatty tumor Left 1995   thigh    Family History  Problem Relation Age of Onset  . Cancer Father 49       prostate  . Kidney Stones Brother   . Diabetes Sister   . Hypertension Neg Hx   . Colon cancer Neg Hx     Social  History   Socioeconomic History  . Marital status: Married    Spouse name: Not on file  . Number of children: Not on file  . Years of education: Not on file  . Highest education level: Not on file  Occupational History    Comment: Retired  Tobacco Use  . Smoking status: Former Smoker    Packs/day: 0.50    Years: 20.00    Pack years: 10.00    Quit date: 06/20/2006    Years since quitting: 13.2  . Smokeless tobacco: Never Used  Substance and Sexual Activity  . Alcohol use: No    Alcohol/week: 0.0 standard drinks  . Drug use: No  . Sexual activity: Not Currently  Other Topics Concern  . Not on file  Social History Narrative  . Not on file   Social Determinants of Health   Financial Resource Strain:   . Difficulty of Paying Living Expenses:   Food Insecurity:   . Worried About Charity fundraiser  in the Last Year:   . [place] in the Last Year:   Transportation Needs:   . Film/video editor (Medical):   Marland Kitchen Lack of Transportation (Non-Medical):   Physical Activity:   . Days of Exercise per Week:   . Minutes of Exercise per Session:   Stress:   . Feeling of Stress :   Social Connections:   . Frequency of Communication with Friends and Family:   . Frequency of Social Gatherings with Friends and Family:   . Attends Religious Services:   . Active Member of Clubs or Organizations:   . Attends Archivist Meetings:   Marland Kitchen Marital Status:   Intimate Partner Violence:   . Fear of Current or Ex-Partner:   . Emotionally Abused:   Marland Kitchen Physically Abused:   . Sexually Abused:     Outpatient Medications Prior to Visit  Medication Sig Dispense Refill  . Alcohol Swabs (B-D SINGLE USE SWABS REGULAR) PADS USE  DAILY 200 each 0  . amLODipine (NORVASC) 10 MG tablet Take 1 tablet by mouth once daily 90 tablet 0  . atorvastatin (LIPITOR) 40 MG tablet TAKE 1 TABLET EVERY DAY 90 tablet 1  . fluticasone (FLONASE) 50 MCG/ACT nasal spray Place 1 spray into both nostrils  daily. 16 g 2  . hydrochlorothiazide (HYDRODIURIL) 25 MG tablet Take 1 tablet by mouth once daily 90 tablet 0  . insulin NPH Human (NOVOLIN N) 100 UNIT/ML injection Inject 0.4 mLs (40 Units total) into the skin at bedtime. 20 mL 11  . insulin regular (HUMULIN R) 100 units/mL injection Inject 0.25 mLs (25 Units total) into the skin 2 (two) times daily before a meal. 20 mL 11  . sildenafil (VIAGRA) 100 MG tablet Take 100 mg by mouth daily as needed.    . TRUE METRIX BLOOD GLUCOSE TEST test strip CHECK BLOOD SUGAR TWICE DAILY  ( APPOINTMENT IS NEEDED  FOR  FURTHER  REFILLS) 200 strip 0  . TRUEPLUS LANCETS 30G MISC USE  TO CHECK BLOOD SUGAR TWICE DAILY 200 each 0  . hydrALAZINE (APRESOLINE) 50 MG tablet Take 1 tablet (50 mg total) by mouth 3 (three) times daily for 7 days. 21 tablet 0   No facility-administered medications prior to visit.    Allergies  Allergen Reactions  . Zestril [Lisinopril]     angioedema    ROS Review of Systems  Constitutional: Negative.   HENT: Negative.   Eyes: Negative for photophobia and visual disturbance.  Respiratory: Negative.   Cardiovascular: Negative.   Gastrointestinal: Negative.   Endocrine: Negative for polyphagia and polyuria.  Genitourinary: Negative.   Musculoskeletal: Negative for gait problem and joint swelling.  Neurological: Negative for tremors and speech difficulty.  Hematological: Does not bruise/bleed easily.  Psychiatric/Behavioral: Negative.       Objective:    Physical Exam  Constitutional: He is oriented to person, place, and time. He appears well-developed and well-nourished. No distress.  HENT:  Head: Normocephalic and atraumatic.  Right Ear: External ear normal.  Left Ear: External ear normal.  Eyes: Conjunctivae are normal. Right eye exhibits no discharge. Left eye exhibits no discharge. No scleral icterus.  Neck: No JVD present. No tracheal deviation present.  Cardiovascular: Normal rate, regular rhythm and normal heart  sounds.  Pulmonary/Chest: Effort normal and breath sounds normal. No stridor.  Abdominal: Bowel sounds are normal.  Neurological: He is alert and oriented to person, place, and time.  Skin: He is not diaphoretic.  Psychiatric: He  has a normal mood and affect. His behavior is normal.    BP 128/70   Pulse 70   Temp 97.9 F (36.6 C) (Tympanic)   Ht 5\' 7"  (1.702 m)   Wt 224 lb 12.8 oz (102 kg)   SpO2 98%   BMI 35.21 kg/m  Wt Readings from Last 3 Encounters:  09/11/19 224 lb 12.8 oz (102 kg)  09/09/19 224 lb (101.6 kg)  08/30/19 222 lb (100.7 kg)     Health Maintenance Due  Topic Date Due  . COLONOSCOPY  09/30/2018  . URINE MICROALBUMIN  04/07/2019  . FOOT EXAM  07/11/2019    There are no preventive care reminders to display for this patient.  Lab Results  Component Value Date   TSH 1.68 04/06/2018   Lab Results  Component Value Date   WBC 7.4 04/06/2018   HGB 14.9 04/06/2018   HCT 43.8 04/06/2018   MCV 93.2 04/06/2018   PLT 228.0 04/06/2018   Lab Results  Component Value Date   NA 139 04/06/2018   K 4.0 04/06/2018   CO2 28 04/06/2018   GLUCOSE 150 (H) 04/06/2018   BUN 28 (H) 04/06/2018   CREATININE 2.00 (H) 04/06/2018   BILITOT 0.7 04/06/2018   ALKPHOS 61 04/06/2018   AST 28 04/06/2018   ALT 26 04/06/2018   PROT 7.7 04/06/2018   ALBUMIN 4.8 04/06/2018   CALCIUM 10.4 04/06/2018   CALCIUM 10.4 (H) 04/06/2018   GFR 41.78 (L) 04/06/2018   Lab Results  Component Value Date   CHOL 126 04/06/2018   Lab Results  Component Value Date   HDL 40.50 04/06/2018   Lab Results  Component Value Date   LDLCALC 58 04/06/2018   Lab Results  Component Value Date   TRIG 137.0 04/06/2018   Lab Results  Component Value Date   CHOLHDL 3 04/06/2018   Lab Results  Component Value Date   HGBA1C 8.1 (A) 09/09/2019      Assessment & Plan:   Problem List Items Addressed This Visit      Cardiovascular and Mediastinum   HTN (hypertension) - Primary   Relevant  Orders   Comprehensive metabolic panel   CBC   Urinalysis, Routine w reflex microscopic     Genitourinary   CKD (chronic kidney disease)   Relevant Orders   Comprehensive metabolic panel   Urinalysis, Routine w reflex microscopic     Other   Dyslipidemia   Relevant Orders   Comprehensive metabolic panel   Lipid panel      No orders of the defined types were placed in this encounter.   Follow-up: Return in about 6 months (around 03/13/2020).    Libby Maw, MD

## 2019-09-12 ENCOUNTER — Encounter: Payer: Medicare HMO | Admitting: Family Medicine

## 2019-09-13 ENCOUNTER — Telehealth: Payer: Self-pay | Admitting: Family Medicine

## 2019-09-13 DIAGNOSIS — E1122 Type 2 diabetes mellitus with diabetic chronic kidney disease: Secondary | ICD-10-CM

## 2019-09-13 DIAGNOSIS — Z794 Long term (current) use of insulin: Secondary | ICD-10-CM

## 2019-09-13 MED ORDER — BLOOD GLUCOSE MONITOR KIT: PACK | 0 refills | Status: DC

## 2019-09-13 NOTE — Telephone Encounter (Signed)
Pt called because he said he was told by his insurance to call and let Dr Ethelene Hal know that they will be contacting him regarding getting a new diabetic meter

## 2019-09-24 ENCOUNTER — Other Ambulatory Visit: Payer: Self-pay | Admitting: Family Medicine

## 2019-09-24 NOTE — Telephone Encounter (Signed)
Last OV 09/11/19 Last fill 06/03/19  #90/0

## 2019-09-26 ENCOUNTER — Other Ambulatory Visit: Payer: Self-pay

## 2019-09-26 DIAGNOSIS — Z794 Long term (current) use of insulin: Secondary | ICD-10-CM

## 2019-09-26 DIAGNOSIS — E1122 Type 2 diabetes mellitus with diabetic chronic kidney disease: Secondary | ICD-10-CM

## 2019-10-14 DIAGNOSIS — N183 Chronic kidney disease, stage 3 unspecified: Secondary | ICD-10-CM | POA: Diagnosis not present

## 2019-10-22 DIAGNOSIS — N281 Cyst of kidney, acquired: Secondary | ICD-10-CM | POA: Diagnosis not present

## 2019-10-22 DIAGNOSIS — E559 Vitamin D deficiency, unspecified: Secondary | ICD-10-CM | POA: Diagnosis not present

## 2019-10-22 DIAGNOSIS — I129 Hypertensive chronic kidney disease with stage 1 through stage 4 chronic kidney disease, or unspecified chronic kidney disease: Secondary | ICD-10-CM | POA: Diagnosis not present

## 2019-10-22 DIAGNOSIS — N1832 Chronic kidney disease, stage 3b: Secondary | ICD-10-CM | POA: Diagnosis not present

## 2019-10-23 ENCOUNTER — Other Ambulatory Visit: Payer: Self-pay | Admitting: Family Medicine

## 2019-11-08 ENCOUNTER — Other Ambulatory Visit: Payer: Self-pay

## 2019-11-11 ENCOUNTER — Other Ambulatory Visit: Payer: Self-pay | Admitting: Endocrinology

## 2019-11-12 ENCOUNTER — Encounter: Payer: Self-pay | Admitting: Endocrinology

## 2019-11-12 ENCOUNTER — Other Ambulatory Visit: Payer: Self-pay

## 2019-11-12 ENCOUNTER — Ambulatory Visit: Payer: Medicare HMO | Admitting: Endocrinology

## 2019-11-12 VITALS — BP 130/72 | HR 74 | Ht 67.0 in | Wt 220.0 lb

## 2019-11-12 DIAGNOSIS — E119 Type 2 diabetes mellitus without complications: Secondary | ICD-10-CM | POA: Diagnosis not present

## 2019-11-12 LAB — POCT GLYCOSYLATED HEMOGLOBIN (HGB A1C): Hemoglobin A1C: 7.2 % — AB (ref 4.0–5.6)

## 2019-11-12 NOTE — Patient Instructions (Addendum)
Please continue the same insulins check your blood sugar twice a day.  vary the time of day when you check, between before the 3 meals, and at bedtime.  also check if you have symptoms of your blood sugar being too high or too low.  please keep a record of the readings and bring it to your next appointment here (or you can bring the meter itself).  You can write it on any piece of paper.  please call us sooner if your blood sugar goes below 70, or if you have a lot of readings over 200.   Please come back for a follow-up appointment in 3-4 months.

## 2019-11-12 NOTE — Progress Notes (Signed)
Subjective:    Patient ID: John Fry, male    DOB: 1940/10/14, 79 y.o.   MRN: [id number]  HPI Pt returns for f/u of diabetes mellitus: DM type: 1 Dx'ed: 0881 Complications: renal insuff  Therapy: insulin since dx DKA: only at dx.   Severe hypoglycemia: never.  Pancreatitis: never.   SDOH: he takes human insulin, due to cost.   Other: he takes multiple daily injections.   Interval history: pt says he never misses the insulin.  no cbg record, but states cbg's vary from 90-140.  It is in general lowest in the afternoon, with exertion.  pt states he feels well in general.   Past Medical History:  Diagnosis Date  . Diabetes mellitus without complication ([place])   . DYSLIPIDEMIA 03/05/2007  . Helicobacter pylori (H. pylori) 09/25/2013  . HERNIA 03/05/2007  . HYPERTENSION 03/05/2007  . 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 RENAL INSUFFICIENCY 05/22/2008  . S/P radiation therapy 04/14/2014 through 06/11/2014                                                      Prostate 7800 cGy in 40 sessions, seminal vesicles 5600 cGy in 40 sessions                        . SHOULDER PAIN, RIGHT 05/08/2008  . 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 FOOT SURGERY  1999   Right Foot Bunion Repair  . Lower Arterial  05/06/2003  . PROSTATE BIOPSY  07/23/13   gleason 7, volume 53 ml  . remove fatty tumor Left 1995   thigh    Social History   Socioeconomic History  . Marital status: Married    Spouse name: Not on file  . Number of children: Not on file  . Years of education: Not on file  . Highest education level: Not on file  Occupational History    Comment: Retired  Tobacco Use  . Smoking status: Former Smoker    Packs/day: 0.50    Years: 20.00    Pack years: 10.00    Quit date: 06/20/2006    Years since quitting: 13.4  . Smokeless tobacco: Never Used  Substance and Sexual Activity  . Alcohol use: No   Alcohol/week: 0.0 standard drinks  . Drug use: No  . Sexual activity: Not Currently  Other Topics Concern  . Not on file  Social History Narrative  . Not on file   Social Determinants of Health   Financial Resource Strain:   . Difficulty of Paying Living Expenses:   Food Insecurity:   . Worried About Charity fundraiser in the Last Year:   . Arboriculturist in the Last Year:   Transportation Needs:   . Film/video editor (Medical):   Marland Kitchen Lack of Transportation (Non-Medical):   Physical Activity:   . Days of Exercise per Week:   . Minutes of Exercise per Session:   Stress:   . Feeling of Stress :   Social Connections:   . Frequency of Communication with Friends and Family:   . Frequency of Social Gatherings with Friends and Family:   . Attends Religious Services:   . Active Member  of Clubs or Organizations:   . Attends Archivist Meetings:   Marland Kitchen Marital Status:   Intimate Partner Violence:   . Fear of Current or Ex-Partner:   . Emotionally Abused:   Marland Kitchen Physically Abused:   . Sexually Abused:     Current Outpatient Medications on File Prior to Visit  Medication Sig Dispense Refill  . Alcohol Swabs (B-D SINGLE USE SWABS REGULAR) PADS USE  DAILY 200 each 0  . amLODipine (NORVASC) 10 MG tablet Take 1 tablet by mouth once daily 90 tablet 0  . atorvastatin (LIPITOR) 40 MG tablet TAKE 1 TABLET EVERY DAY 90 tablet 3  . blood glucose meter kit and supplies KIT Dispense based on patient and insurance preference. Use up to four times daily as directed. (FOR ICD-9 250.00, 250.01). 1 each 0  . fluticasone (FLONASE) 50 MCG/ACT nasal spray Place 1 spray into both nostrils daily. 16 g 2  . hydrochlorothiazide (HYDRODIURIL) 25 MG tablet Take 1 tablet by mouth once daily 90 tablet 0  . insulin NPH Human (NOVOLIN N) 100 UNIT/ML injection Inject 0.4 mLs (40 Units total) into the skin at bedtime. 20 mL 11  . insulin regular (HUMULIN R) 100 units/mL injection Inject 0.25 mLs (25 Units  total) into the skin 2 (two) times daily before a meal. 20 mL 11  . sildenafil (VIAGRA) 100 MG tablet Take 100 mg by mouth daily as needed.    . TRUE METRIX BLOOD GLUCOSE TEST test strip CHECK BLOOD SUGAR TWICE DAILY (APPOINTMENT IS NEEDED FOR FURTHER REFILLS) 200 strip 0  . TRUEPLUS LANCETS 30G MISC USE  TO CHECK BLOOD SUGAR TWICE DAILY 200 each 0  . hydrALAZINE (APRESOLINE) 50 MG tablet Take 1 tablet (50 mg total) by mouth 3 (three) times daily for 7 days. 21 tablet 0   No current facility-administered medications on file prior to visit.    Allergies  Allergen Reactions  . Zestril [Lisinopril]     angioedema    Family History  Problem Relation Age of Onset  . Cancer Father 63       prostate  . Kidney Stones Brother   . Diabetes Sister   . Hypertension Neg Hx   . Colon cancer Neg Hx     BP 130/72   Pulse 74   Ht '5\' 7"'$  (1.702 m)   Wt 220 lb (99.8 kg)   SpO2 97%   BMI 34.46 kg/m    Review of Systems He denies hypoglycemia    Objective:   Physical Exam VITAL SIGNS:  See vs page GENERAL: no distress Pulses: dorsalis pedis intact bilat.   MSK: no deformity of the feet CV: no leg edema Skin:  no ulcer on the feet.  normal color and temp on the feet.  Old healed surgical scar over the right foot bunion area Neuro: sensation is intact to touch on the feet Ext: there is bilateral onychomycosis of the toenails  Lab Results  Component Value Date   HGBA1C 7.2 (A) 11/12/2019       Assessment & Plan:  Type 1 DM, with CRI: well-controlled.  Patient Instructions  Please continue the same insulins check your blood sugar twice a day.  vary the time of day when you check, between before the 3 meals, and at bedtime.  also check if you have symptoms of your blood sugar being too high or too low.  please keep a record of the readings and bring it to your next appointment here (or you  can bring the meter itself).  You can write it on any piece of paper.  please call us sooner if  your blood sugar goes below 70, or if you have a lot of readings over 200.   Please come back for a follow-up appointment in 3-4 months.

## 2019-12-09 ENCOUNTER — Other Ambulatory Visit: Payer: Self-pay

## 2019-12-09 ENCOUNTER — Telehealth (INDEPENDENT_AMBULATORY_CARE_PROVIDER_SITE_OTHER): Payer: Medicare HMO | Admitting: Family Medicine

## 2019-12-09 ENCOUNTER — Encounter: Payer: Self-pay | Admitting: Family Medicine

## 2019-12-09 VITALS — Ht 67.0 in | Wt 220.0 lb

## 2019-12-09 DIAGNOSIS — J0110 Acute frontal sinusitis, unspecified: Secondary | ICD-10-CM | POA: Diagnosis not present

## 2019-12-09 MED ORDER — AMOXICILLIN-POT CLAVULANATE 875-125 MG PO TABS: 1.0000 | ORAL_TABLET | Freq: Two times a day (BID) | ORAL | 0 refills | Status: DC

## 2019-12-09 NOTE — Progress Notes (Addendum)
Established Patient Office Visit  Subjective:  Patient ID: John Fry, male    DOB: 03-01-1941  Age: 79 y.o. MRN: [id number]  CC:  Chief Complaint  Patient presents with  . Pain    pt c/o pain on forehead that radiates to left side of eye. Seem to hurt more when he is eating. Pt een at ER 1 month ago for this given nasal spray and medications but not improving.     HPI John Fry presents for treatment and evaluation left forehead pain radiating down to the eye and nose. Milky rhinorrhea with pnd. Denies fever or chills. Tried nasal spray given during er visit without much relief. Denies cough or wheeze.   Past Medical History:  Diagnosis Date  . Diabetes mellitus without complication ([place])   . DYSLIPIDEMIA 03/05/2007  . Helicobacter pylori (H. pylori) 09/25/2013  . HERNIA 03/05/2007  . HYPERTENSION 03/05/2007  . 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 RENAL INSUFFICIENCY 05/22/2008  . S/P radiation therapy 04/14/2014 through 06/11/2014                                                      Prostate 7800 cGy in 40 sessions, seminal vesicles 5600 cGy in 40 sessions                        . SHOULDER PAIN, RIGHT 05/08/2008  . 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 FOOT SURGERY  1999   Right Foot Bunion Repair  . Lower Arterial  05/06/2003  . PROSTATE BIOPSY  07/23/13   gleason 7, volume 53 ml  . remove fatty tumor Left 1995   thigh    Family History  Problem Relation Age of Onset  . Cancer Father 35       prostate  . Kidney Stones Brother   . Diabetes Sister   . Hypertension Neg Hx   . Colon cancer Neg Hx     Social History   Socioeconomic History  . Marital status: Married    Spouse name: Not on file  . Number of children: Not on file  . Years of education: Not on file  . Highest education level: Not on file  Occupational History    Comment: Retired  Tobacco Use  .  Smoking status: Former Smoker    Packs/day: 0.50    Years: 20.00    Pack years: 10.00    Quit date: 06/20/2006    Years since quitting: 13.4  . Smokeless tobacco: Never Used  Substance and Sexual Activity  . Alcohol use: No    Alcohol/week: 0.0 standard drinks  . Drug use: No  . Sexual activity: Not Currently  Other Topics Concern  . Not on file  Social History Narrative  . Not on file   Social Determinants of Health   Financial Resource Strain:   . Difficulty of Paying Living Expenses:   Food Insecurity:   . Worried About Charity fundraiser in the Last Year:   . Arboriculturist in the Last Year:   Transportation Needs:   . Film/video editor (Medical):   Marland Kitchen Lack of Transportation (Non-Medical):   Physical Activity:   . Days of  Exercise per Week:   . Minutes of Exercise per Session:   Stress:   . Feeling of Stress :   Social Connections:   . Frequency of Communication with Friends and Family:   . Frequency of Social Gatherings with Friends and Family:   . Attends Religious Services:   . Active Member of Clubs or Organizations:   . Attends Archivist Meetings:   Marland Kitchen Marital Status:   Intimate Partner Violence:   . Fear of Current or Ex-Partner:   . Emotionally Abused:   Marland Kitchen Physically Abused:   . Sexually Abused:     Outpatient Medications Prior to Visit  Medication Sig Dispense Refill  . Alcohol Swabs (B-D SINGLE USE SWABS REGULAR) PADS USE  DAILY 200 each 0  . amLODipine (NORVASC) 10 MG tablet Take 1 tablet by mouth once daily 90 tablet 0  . atorvastatin (LIPITOR) 40 MG tablet TAKE 1 TABLET EVERY DAY 90 tablet 3  . blood glucose meter kit and supplies KIT Dispense based on patient and insurance preference. Use up to four times daily as directed. (FOR ICD-9 250.00, 250.01). 1 each 0  . fluticasone (FLONASE) 50 MCG/ACT nasal spray Place 1 spray into both nostrils daily. 16 g 2  . hydrochlorothiazide (HYDRODIURIL) 25 MG tablet Take 1 tablet by mouth once  daily 90 tablet 0  . insulin NPH Human (NOVOLIN N) 100 UNIT/ML injection Inject 0.4 mLs (40 Units total) into the skin at bedtime. 20 mL 11  . insulin regular (HUMULIN R) 100 units/mL injection Inject 0.25 mLs (25 Units total) into the skin 2 (two) times daily before a meal. 20 mL 11  . TRUE METRIX BLOOD GLUCOSE TEST test strip CHECK BLOOD SUGAR TWICE DAILY (APPOINTMENT IS NEEDED FOR FURTHER REFILLS) 200 strip 0  . TRUEPLUS LANCETS 30G MISC USE  TO CHECK BLOOD SUGAR TWICE DAILY 200 each 0  . hydrALAZINE (APRESOLINE) 50 MG tablet Take 1 tablet (50 mg total) by mouth 3 (three) times daily for 7 days. 21 tablet 0  . sildenafil (VIAGRA) 100 MG tablet Take 100 mg by mouth daily as needed. (Patient not taking: Reported on 12/09/2019)     No facility-administered medications prior to visit.    Allergies  Allergen Reactions  . Zestril [Lisinopril]     angioedema    ROS Review of Systems  Constitutional: Negative for chills, diaphoresis, fatigue, fever and unexpected weight change.  HENT: Positive for congestion, postnasal drip, rhinorrhea, sinus pressure and sinus pain.   Eyes: Negative for photophobia, pain and visual disturbance.  Respiratory: Negative for chest tightness, shortness of breath and wheezing.   Cardiovascular: Negative.   Gastrointestinal: Negative.   Musculoskeletal: Negative for gait problem and joint swelling.  Allergic/Immunologic: Negative for immunocompromised state.  Neurological: Negative for light-headedness and headaches.  Hematological: Does not bruise/bleed easily.      Objective:    Physical Exam Nursing note reviewed.  Constitutional:      General: He is not in acute distress. Pulmonary:     Effort: Pulmonary effort is normal.  Neurological:     Mental Status: He is oriented to person, place, and time.  Psychiatric:        Mood and Affect: Mood normal.        Behavior: Behavior normal.     Ht '5\' 7"'$  (1.702 m)   Wt 220 lb (99.8 kg)   BMI 34.46  kg/m  Wt Readings from Last 3 Encounters:  12/09/19 220 lb (99.8 kg)  11/12/19  220 lb (99.8 kg)  09/11/19 224 lb 12.8 oz (102 kg)     Health Maintenance Due  Topic Date Due  . Hepatitis C Screening  Never done  . COVID-19 Vaccine (1) Never done  . COLONOSCOPY  09/30/2018  . URINE MICROALBUMIN  04/07/2019  . FOOT EXAM  07/11/2019    There are no preventive care reminders to display for this patient.  Lab Results  Component Value Date   TSH 1.68 04/06/2018   Lab Results  Component Value Date   WBC 7.5 09/11/2019   HGB 13.5 09/11/2019   HCT 40.0 09/11/2019   MCV 94.2 09/11/2019   PLT 213.0 09/11/2019   Lab Results  Component Value Date   NA 139 09/11/2019   K 4.3 09/11/2019   CO2 28 09/11/2019   GLUCOSE 122 (H) 09/11/2019   BUN 18 09/11/2019   CREATININE 1.61 (H) 09/11/2019   BILITOT 0.6 09/11/2019   ALKPHOS 63 09/11/2019   AST 24 09/11/2019   ALT 25 09/11/2019   PROT 6.8 09/11/2019   ALBUMIN 4.4 09/11/2019   CALCIUM 9.4 09/11/2019   GFR 50.30 (L) 09/11/2019   Lab Results  Component Value Date   CHOL 135 09/11/2019   Lab Results  Component Value Date   HDL 40.60 09/11/2019   Lab Results  Component Value Date   LDLCALC 71 09/11/2019   Lab Results  Component Value Date   TRIG 116.0 09/11/2019   Lab Results  Component Value Date   CHOLHDL 3 09/11/2019   Lab Results  Component Value Date   HGBA1C 7.2 (A) 11/12/2019      Assessment & Plan:   Problem List Items Addressed This Visit    None    Visit Diagnoses    Acute non-recurrent frontal sinusitis    -  Primary   Relevant Medications   amoxicillin-clavulanate (AUGMENTIN) 875-125 MG tablet      Meds ordered this encounter  Medications  . amoxicillin-clavulanate (AUGMENTIN) 875-125 MG tablet    Sig: Take 1 tablet by mouth 2 (two) times daily.    Dispense:  20 tablet    Refill:  0    Follow-up: Return in about 1 week (around 12/16/2019), or if symptoms worsen or fail to improve.     Libby Maw, MD   Virtual Visit via Telephone Note  I connected with Lendon Ka Brackin on 12/17/19 at  4:00 PM EDT by telephone and verified that I am speaking with the correct person using two identifiers.  Location: Patient: home alone.  Provider:   I discussed the limitations, risks, security and privacy concerns of performing an evaluation and management service by telephone and the availability of in person appointments. I also discussed with the patient that there may be a patient responsible charge related to this service. The patient expressed understanding and agreed to proceed.   History of Present Illness:    Observations/Objective:   Assessment and Plan:   Follow Up Instructions:    I discussed the assessment and treatment plan with the patient. The patient was provided an opportunity to ask questions and all were answered. The patient agreed with the plan and demonstrated an understanding of the instructions.   The patient was advised to call back or seek an in-person evaluation if the symptoms worsen or if the condition fails to improve as anticipated.  I provided 20 minutes of non-face-to-face time during this encounter.   Libby Maw, MD

## 2019-12-16 ENCOUNTER — Telehealth: Payer: Self-pay | Admitting: Family Medicine

## 2019-12-16 NOTE — Telephone Encounter (Signed)
Spoke with patient who states that he was given antibiotics (Augmentin) 5 days ago but he is still having pain in his eye and head, per patient his nose also still feels stopped up medication does not seem like it is helping is there anything else he can take. Please advise.

## 2019-12-16 NOTE — Telephone Encounter (Signed)
Patient states the antibiotic Dr. Ethelene Hal gave him last week has not helped. Please call patient and advise.

## 2019-12-16 NOTE — Telephone Encounter (Signed)
Appointment scheduled.

## 2019-12-16 NOTE — Telephone Encounter (Signed)
Needs to be seen

## 2019-12-17 ENCOUNTER — Other Ambulatory Visit: Payer: Self-pay

## 2019-12-18 ENCOUNTER — Encounter: Payer: Self-pay | Admitting: Family Medicine

## 2019-12-18 ENCOUNTER — Ambulatory Visit (INDEPENDENT_AMBULATORY_CARE_PROVIDER_SITE_OTHER): Payer: Medicare HMO | Admitting: Family Medicine

## 2019-12-18 VITALS — BP 144/72 | HR 77 | Temp 97.7°F | Ht 67.0 in | Wt 217.2 lb

## 2019-12-18 DIAGNOSIS — J0100 Acute maxillary sinusitis, unspecified: Secondary | ICD-10-CM

## 2019-12-18 DIAGNOSIS — J320 Chronic maxillary sinusitis: Secondary | ICD-10-CM | POA: Insufficient documentation

## 2019-12-18 MED ORDER — PREDNISONE 10 MG PO TABS: 10.0000 mg | ORAL_TABLET | Freq: Two times a day (BID) | ORAL | 0 refills | Status: AC

## 2019-12-18 NOTE — Progress Notes (Signed)
Established Patient Office Visit  Subjective:  Patient ID: John Fry, male    DOB: 05/07/1941  Age: 79 y.o. MRN: [id number]  CC:  Chief Complaint  Patient presents with  . Sinusitis    C/O left eye pain with headache patient on antibiotics x 1 week with no improvement.     HPI John Fry presents for follow-up of sinusitis.  He has been taking the Augmentin as prescribed and continues to have significant pain in his left cheekbone.  Drainage down the back of his throat has stopped.  He denies fevers.  It is slightly improved.  He has pain with mastication.  Diabetes is well controlled.  He is having problems with his teeth in the left lower jaw.    Past Medical History:  Diagnosis Date  . Diabetes mellitus without complication ([place])   . DYSLIPIDEMIA 03/05/2007  . Helicobacter pylori (H. pylori) 09/25/2013  . HERNIA 03/05/2007  . HYPERTENSION 03/05/2007  . 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 RENAL INSUFFICIENCY 05/22/2008  . S/P radiation therapy 04/14/2014 through 06/11/2014                                                      Prostate 7800 cGy in 40 sessions, seminal vesicles 5600 cGy in 40 sessions                        . SHOULDER PAIN, RIGHT 05/08/2008  . 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 FOOT SURGERY  1999   Right Foot Bunion Repair  . Lower Arterial  05/06/2003  . PROSTATE BIOPSY  07/23/13   gleason 7, volume 53 ml  . remove fatty tumor Left 1995   thigh    Family History  Problem Relation Age of Onset  . Cancer Father 38       prostate  . Kidney Stones Brother   . Diabetes Sister   . Hypertension Neg Hx   . Colon cancer Neg Hx     Social History   Socioeconomic History  . Marital status: Married    Spouse name: Not on file  . Number of children: Not on file  . Years of education: Not on file  . Highest education level: Not on file  Occupational  History    Comment: Retired  Tobacco Use  . Smoking status: Former Smoker    Packs/day: 0.50    Years: 20.00    Pack years: 10.00    Quit date: 06/20/2006    Years since quitting: 13.5  . Smokeless tobacco: Never Used  Substance and Sexual Activity  . Alcohol use: No    Alcohol/week: 0.0 standard drinks  . Drug use: No  . Sexual activity: Not Currently  Other Topics Concern  . Not on file  Social History Narrative  . Not on file   Social Determinants of Health   Financial Resource Strain:   . Difficulty of Paying Living Expenses:   Food Insecurity:   . Worried About Charity fundraiser in the Last Year:   . Arboriculturist in the Last Year:   Transportation Needs:   . Film/video editor (Medical):   Marland Kitchen Lack of Transportation (Non-Medical):  Physical Activity:   . Days of Exercise per Week:   . Minutes of Exercise per Session:   Stress:   . Feeling of Stress :   Social Connections:   . Frequency of Communication with Friends and Family:   . Frequency of Social Gatherings with Friends and Family:   . Attends Religious Services:   . Active Member of Clubs or Organizations:   . Attends Archivist Meetings:   Marland Kitchen Marital Status:   Intimate Partner Violence:   . Fear of Current or Ex-Partner:   . Emotionally Abused:   Marland Kitchen Physically Abused:   . Sexually Abused:     Outpatient Medications Prior to Visit  Medication Sig Dispense Refill  . Alcohol Swabs (B-D SINGLE USE SWABS REGULAR) PADS USE  DAILY 200 each 0  . amLODipine (NORVASC) 10 MG tablet Take 1 tablet by mouth once daily 90 tablet 0  . amoxicillin-clavulanate (AUGMENTIN) 875-125 MG tablet Take 1 tablet by mouth 2 (two) times daily. 20 tablet 0  . atorvastatin (LIPITOR) 40 MG tablet TAKE 1 TABLET EVERY DAY 90 tablet 3  . blood glucose meter kit and supplies KIT Dispense based on patient and insurance preference. Use up to four times daily as directed. (FOR ICD-9 250.00, 250.01). 1 each 0  . fluticasone  (FLONASE) 50 MCG/ACT nasal spray Place 1 spray into both nostrils daily. 16 g 2  . hydrochlorothiazide (HYDRODIURIL) 25 MG tablet Take 1 tablet by mouth once daily 90 tablet 0  . insulin NPH Human (NOVOLIN N) 100 UNIT/ML injection Inject 0.4 mLs (40 Units total) into the skin at bedtime. 20 mL 11  . insulin regular (HUMULIN R) 100 units/mL injection Inject 0.25 mLs (25 Units total) into the skin 2 (two) times daily before a meal. 20 mL 11  . TRUE METRIX BLOOD GLUCOSE TEST test strip CHECK BLOOD SUGAR TWICE DAILY (APPOINTMENT IS NEEDED FOR FURTHER REFILLS) 200 strip 0  . TRUEPLUS LANCETS 30G MISC USE  TO CHECK BLOOD SUGAR TWICE DAILY 200 each 0  . hydrALAZINE (APRESOLINE) 50 MG tablet Take 1 tablet (50 mg total) by mouth 3 (three) times daily for 7 days. 21 tablet 0  . sildenafil (VIAGRA) 100 MG tablet Take 100 mg by mouth daily as needed. (Patient not taking: Reported on 12/09/2019)     No facility-administered medications prior to visit.    Allergies  Allergen Reactions  . Zestril [Lisinopril]     angioedema    ROS Review of Systems  Constitutional: Negative for chills, diaphoresis, fatigue, fever and unexpected weight change.  HENT: Positive for congestion, sinus pressure and sinus pain. Negative for facial swelling, hearing loss, mouth sores, postnasal drip, trouble swallowing and voice change.   Eyes: Negative for photophobia and visual disturbance.  Respiratory: Negative.   Cardiovascular: Negative.   Psychiatric/Behavioral: Negative.       Objective:    Physical Exam Constitutional:      General: He is not in acute distress.    Appearance: Normal appearance. He is not ill-appearing, toxic-appearing or diaphoretic.  HENT:     Head: Normocephalic and atraumatic.     Right Ear: Tympanic membrane, ear canal and external ear normal.     Left Ear: Tympanic membrane, ear canal and external ear normal.     Nose: No congestion or rhinorrhea.     Mouth/Throat:     Mouth: Mucous  membranes are moist.     Pharynx: Oropharynx is clear. No oropharyngeal exudate or posterior oropharyngeal erythema.  Eyes:     General: No scleral icterus.       Right eye: No discharge.        Left eye: No discharge.     Extraocular Movements: Extraocular movements intact.     Conjunctiva/sclera: Conjunctivae normal.     Pupils: Pupils are equal, round, and reactive to light.  Cardiovascular:     Rate and Rhythm: Normal rate and regular rhythm.  Pulmonary:     Effort: Pulmonary effort is normal.     Breath sounds: Normal breath sounds.  Skin:    General: Skin is warm and dry.  Neurological:     Mental Status: He is alert and oriented to person, place, and time.  Psychiatric:        Mood and Affect: Mood normal.        Behavior: Behavior normal.     BP (!) 144/72   Pulse 77   Temp 97.7 F (36.5 C) (Tympanic)   Ht 5' 7"  (1.702 m)   Wt 217 lb 3.2 oz (98.5 kg)   SpO2 97%   BMI 34.02 kg/m  Wt Readings from Last 3 Encounters:  12/18/19 217 lb 3.2 oz (98.5 kg)  12/09/19 220 lb (99.8 kg)  11/12/19 220 lb (99.8 kg)     Health Maintenance Due  Topic Date Due  . Hepatitis C Screening  Never done  . COVID-19 Vaccine (1) Never done  . COLONOSCOPY  09/30/2018  . URINE MICROALBUMIN  04/07/2019  . FOOT EXAM  07/11/2019    There are no preventive care reminders to display for this patient.  Lab Results  Component Value Date   TSH 1.68 04/06/2018   Lab Results  Component Value Date   WBC 7.5 09/11/2019   HGB 13.5 09/11/2019   HCT 40.0 09/11/2019   MCV 94.2 09/11/2019   PLT 213.0 09/11/2019   Lab Results  Component Value Date   NA 139 09/11/2019   K 4.3 09/11/2019   CO2 28 09/11/2019   GLUCOSE 122 (H) 09/11/2019   BUN 18 09/11/2019   CREATININE 1.61 (H) 09/11/2019   BILITOT 0.6 09/11/2019   ALKPHOS 63 09/11/2019   AST 24 09/11/2019   ALT 25 09/11/2019   PROT 6.8 09/11/2019   ALBUMIN 4.4 09/11/2019   CALCIUM 9.4 09/11/2019   GFR 50.30 (L) 09/11/2019   Lab  Results  Component Value Date   CHOL 135 09/11/2019   Lab Results  Component Value Date   HDL 40.60 09/11/2019   Lab Results  Component Value Date   LDLCALC 71 09/11/2019   Lab Results  Component Value Date   TRIG 116.0 09/11/2019   Lab Results  Component Value Date   CHOLHDL 3 09/11/2019   Lab Results  Component Value Date   HGBA1C 7.2 (A) 11/12/2019      Assessment & Plan:   Problem List Items Addressed This Visit      Respiratory   Acute maxillary sinusitis - Primary   Relevant Medications   predniSONE (DELTASONE) 10 MG tablet   Other Relevant Orders   CT Maxillofacial WO CM      Meds ordered this encounter  Medications  . predniSONE (DELTASONE) 10 MG tablet    Sig: Take 1 tablet (10 mg total) by mouth 2 (two) times daily with a meal for 5 days.    Dispense:  10 tablet    Refill:  0    Follow-up: Return in about 1 week (around 12/25/2019).  Continue Augmentin and will  add low-dose prednisone.  Follow-up in 1 week.  Libby Maw, MD

## 2019-12-19 ENCOUNTER — Ambulatory Visit: Payer: Medicare HMO | Admitting: Family Medicine

## 2019-12-25 ENCOUNTER — Other Ambulatory Visit: Payer: Self-pay | Admitting: Family Medicine

## 2019-12-26 ENCOUNTER — Other Ambulatory Visit: Payer: Self-pay

## 2019-12-27 ENCOUNTER — Ambulatory Visit (INDEPENDENT_AMBULATORY_CARE_PROVIDER_SITE_OTHER): Payer: Medicare HMO | Admitting: Family Medicine

## 2019-12-27 ENCOUNTER — Encounter: Payer: Self-pay | Admitting: Family Medicine

## 2019-12-27 VITALS — BP 138/70 | HR 78 | Temp 98.6°F | Ht 67.0 in | Wt 217.2 lb

## 2019-12-27 DIAGNOSIS — J320 Chronic maxillary sinusitis: Secondary | ICD-10-CM | POA: Diagnosis not present

## 2019-12-27 MED ORDER — LEVOFLOXACIN 500 MG PO TABS: 500.0000 mg | ORAL_TABLET | Freq: Every day | ORAL | 0 refills | Status: DC

## 2019-12-27 MED ORDER — TRAMADOL HCL 50 MG PO TABS: 50.0000 mg | ORAL_TABLET | Freq: Two times a day (BID) | ORAL | 0 refills | Status: AC | PRN

## 2019-12-27 NOTE — Progress Notes (Signed)
Established Patient Office Visit  Subjective:  Patient ID: John Fry, male    DOB: December 25, 1940  Age: 79 y.o. MRN: [id number]  CC:  Chief Complaint  Patient presents with  . Follow-up    1 week follow up on sinus issues, per patient not much improvement pain come and go.     HPI John Fry presents for follow-up of his sinusitis.  6-week history of ongoing pain above his left eye and around the cheekbone as well.  There has been a milky yellow-green rhinorrhea.  Has not responded to 10 days of Augmentin and follow-up prednisone.  Denies fevers chills or cough.  Denies visual changes or problems with his vision.  There is been no headache on the side of his head.  He is a diabetic.  Denies teeth pain or gum pain.  He has no upper teeth.   Past Medical History:  Diagnosis Date  . Diabetes mellitus without complication ([place])   . DYSLIPIDEMIA 03/05/2007  . Helicobacter pylori (H. pylori) 09/25/2013  . HERNIA 03/05/2007  . HYPERTENSION 03/05/2007  . 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 RENAL INSUFFICIENCY 05/22/2008  . S/P radiation therapy 04/14/2014 through 06/11/2014                                                      Prostate 7800 cGy in 40 sessions, seminal vesicles 5600 cGy in 40 sessions                        . SHOULDER PAIN, RIGHT 05/08/2008  . 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 FOOT SURGERY  1999   Right Foot Bunion Repair  . Lower Arterial  05/06/2003  . PROSTATE BIOPSY  07/23/13   gleason 7, volume 53 ml  . remove fatty tumor Left 1995   thigh    Family History  Problem Relation Age of Onset  . Cancer Father 41       prostate  . Kidney Stones Brother   . Diabetes Sister   . Hypertension Neg Hx   . Colon cancer Neg Hx     Social History   Socioeconomic History  . Marital status: Married    Spouse name: Not on file  . Number of children: Not on file  . Years  of education: Not on file  . Highest education level: Not on file  Occupational History    Comment: Retired  Tobacco Use  . Smoking status: Former Smoker    Packs/day: 0.50    Years: 20.00    Pack years: 10.00    Quit date: 06/20/2006    Years since quitting: 13.5  . Smokeless tobacco: Never Used  Substance and Sexual Activity  . Alcohol use: No    Alcohol/week: 0.0 standard drinks  . Drug use: No  . Sexual activity: Not Currently  Other Topics Concern  . Not on file  Social History Narrative  . Not on file   Social Determinants of Health   Financial Resource Strain:   . Difficulty of Paying Living Expenses:   Food Insecurity:   . Worried About Charity fundraiser in the Last Year:   . [place] in the  Last Year:   Transportation Needs:   . Film/video editor (Medical):   Marland Kitchen Lack of Transportation (Non-Medical):   Physical Activity:   . Days of Exercise per Week:   . Minutes of Exercise per Session:   Stress:   . Feeling of Stress :   Social Connections:   . Frequency of Communication with Friends and Family:   . Frequency of Social Gatherings with Friends and Family:   . Attends Religious Services:   . Active Member of Clubs or Organizations:   . Attends Archivist Meetings:   Marland Kitchen Marital Status:   Intimate Partner Violence:   . Fear of Current or Ex-Partner:   . Emotionally Abused:   Marland Kitchen Physically Abused:   . Sexually Abused:     Outpatient Medications Prior to Visit  Medication Sig Dispense Refill  . Alcohol Swabs (B-D SINGLE USE SWABS REGULAR) PADS USE  DAILY 200 each 0  . amLODipine (NORVASC) 10 MG tablet Take 1 tablet by mouth once daily 90 tablet 0  . atorvastatin (LIPITOR) 40 MG tablet TAKE 1 TABLET EVERY DAY 90 tablet 3  . blood glucose meter kit and supplies KIT Dispense based on patient and insurance preference. Use up to four times daily as directed. (FOR ICD-9 250.00, 250.01). 1 each 0  . hydrochlorothiazide (HYDRODIURIL) 25 MG  tablet Take 1 tablet by mouth once daily 90 tablet 0  . insulin NPH Human (NOVOLIN N) 100 UNIT/ML injection Inject 0.4 mLs (40 Units total) into the skin at bedtime. 20 mL 11  . insulin regular (HUMULIN R) 100 units/mL injection Inject 0.25 mLs (25 Units total) into the skin 2 (two) times daily before a meal. 20 mL 11  . TRUE METRIX BLOOD GLUCOSE TEST test strip CHECK BLOOD SUGAR TWICE DAILY (APPOINTMENT IS NEEDED FOR FURTHER REFILLS) 200 strip 0  . TRUEPLUS LANCETS 30G MISC USE  TO CHECK BLOOD SUGAR TWICE DAILY 200 each 0  . amoxicillin-clavulanate (AUGMENTIN) 875-125 MG tablet Take 1 tablet by mouth 2 (two) times daily. (Patient not taking: Reported on 12/27/2019) 20 tablet 0  . fluticasone (FLONASE) 50 MCG/ACT nasal spray Place 1 spray into both nostrils daily. (Patient not taking: Reported on 12/27/2019) 16 g 2  . hydrALAZINE (APRESOLINE) 50 MG tablet Take 1 tablet (50 mg total) by mouth 3 (three) times daily for 7 days. 21 tablet 0  . sildenafil (VIAGRA) 100 MG tablet Take 100 mg by mouth daily as needed. (Patient not taking: Reported on 12/09/2019)     No facility-administered medications prior to visit.    Allergies  Allergen Reactions  . Zestril [Lisinopril]     angioedema    ROS Review of Systems  Constitutional: Negative for chills, diaphoresis, fatigue, fever and unexpected weight change.  HENT: Positive for congestion, rhinorrhea, sinus pressure and sinus pain. Negative for sore throat.   Eyes: Negative for photophobia and visual disturbance.  Respiratory: Negative.   Cardiovascular: Negative.   Gastrointestinal: Negative.   Neurological: Negative for light-headedness and headaches.  Hematological: Negative.   Psychiatric/Behavioral: Negative.       Objective:    Physical Exam Vitals and nursing note reviewed.  Constitutional:      General: He is not in acute distress.    Appearance: Normal appearance. He is not ill-appearing, toxic-appearing or diaphoretic.  HENT:      Head: Normocephalic and atraumatic.      Right Ear: Tympanic membrane and ear canal normal. There is impacted cerumen.  Left Ear: Tympanic membrane, ear canal and external ear normal.     Mouth/Throat:     Mouth: Mucous membranes are moist.     Pharynx: Oropharynx is clear. No oropharyngeal exudate or posterior oropharyngeal erythema.  Eyes:     General: No scleral icterus.       Left eye: No discharge.     Extraocular Movements: Extraocular movements intact.     Conjunctiva/sclera: Conjunctivae normal.     Pupils: Pupils are equal, round, and reactive to light.  Neck:     Vascular: Normal carotid pulses. No carotid bruit.  Cardiovascular:     Rate and Rhythm: Normal rate and regular rhythm.  Pulmonary:     Effort: Pulmonary effort is normal.     Breath sounds: Normal breath sounds.  Neurological:     Mental Status: He is alert and oriented to person, place, and time.     Cranial Nerves: Cranial nerves are intact. No cranial nerve deficit, dysarthria or facial asymmetry.  Psychiatric:        Mood and Affect: Mood normal.        Behavior: Behavior normal.     BP 138/70   Pulse 78   Temp 98.6 F (37 C) (Tympanic)   Ht 5' 7"  (1.702 m)   Wt 217 lb 3.2 oz (98.5 kg)   SpO2 95%   BMI 34.02 kg/m  Wt Readings from Last 3 Encounters:  12/27/19 217 lb 3.2 oz (98.5 kg)  12/18/19 217 lb 3.2 oz (98.5 kg)  12/09/19 220 lb (99.8 kg)     Health Maintenance Due  Topic Date Due  . Hepatitis C Screening  Never done  . COVID-19 Vaccine (1) Never done  . COLONOSCOPY  09/30/2018  . URINE MICROALBUMIN  04/07/2019  . FOOT EXAM  07/11/2019    There are no preventive care reminders to display for this patient.  Lab Results  Component Value Date   TSH 1.68 04/06/2018   Lab Results  Component Value Date   WBC 7.5 09/11/2019   HGB 13.5 09/11/2019   HCT 40.0 09/11/2019   MCV 94.2 09/11/2019   PLT 213.0 09/11/2019   Lab Results  Component Value Date   NA 139 09/11/2019   K  4.3 09/11/2019   CO2 28 09/11/2019   GLUCOSE 122 (H) 09/11/2019   BUN 18 09/11/2019   CREATININE 1.61 (H) 09/11/2019   BILITOT 0.6 09/11/2019   ALKPHOS 63 09/11/2019   AST 24 09/11/2019   ALT 25 09/11/2019   PROT 6.8 09/11/2019   ALBUMIN 4.4 09/11/2019   CALCIUM 9.4 09/11/2019   GFR 50.30 (L) 09/11/2019   Lab Results  Component Value Date   CHOL 135 09/11/2019   Lab Results  Component Value Date   HDL 40.60 09/11/2019   Lab Results  Component Value Date   LDLCALC 71 09/11/2019   Lab Results  Component Value Date   TRIG 116.0 09/11/2019   Lab Results  Component Value Date   CHOLHDL 3 09/11/2019   Lab Results  Component Value Date   HGBA1C 7.2 (A) 11/12/2019      Assessment & Plan:   Problem List Items Addressed This Visit      Respiratory   Maxillary sinusitis - Primary   Relevant Medications   levofloxacin (LEVAQUIN) 500 MG tablet   traMADol (ULTRAM) 50 MG tablet   Other Relevant Orders   Ambulatory referral to ENT      Meds ordered this encounter  Medications  .  levofloxacin (LEVAQUIN) 500 MG tablet    Sig: Take 1 tablet (500 mg total) by mouth daily.    Dispense:  10 tablet    Refill:  0  . traMADol (ULTRAM) 50 MG tablet    Sig: Take 1 tablet (50 mg total) by mouth every 12 (twelve) hours as needed for up to 5 days.    Dispense:  15 tablet    Refill:  0    Follow-up: Return in about 3 weeks (around 01/17/2020), or See ENT doctor as well..  CT scan planned for the 14th.  Referred to ENT for consultation.  Consider mucormycosis.  We will start levofloxacin and tramadol as needed for pain.  Libby Maw, MD

## 2020-01-01 ENCOUNTER — Ambulatory Visit
Admission: RE | Admit: 2020-01-01 | Discharge: 2020-01-01 | Disposition: A | Discharge status: Home / Self Care | Payer: Medicare HMO | Source: Ambulatory Visit | Attending: Family Medicine | Admitting: Family Medicine

## 2020-01-01 ENCOUNTER — Other Ambulatory Visit: Payer: Self-pay

## 2020-01-01 DIAGNOSIS — R600 Localized edema: Secondary | ICD-10-CM | POA: Diagnosis not present

## 2020-01-01 DIAGNOSIS — J3489 Other specified disorders of nose and nasal sinuses: Secondary | ICD-10-CM | POA: Diagnosis not present

## 2020-01-01 DIAGNOSIS — J0100 Acute maxillary sinusitis, unspecified: Secondary | ICD-10-CM

## 2020-01-01 DIAGNOSIS — M7989 Other specified soft tissue disorders: Secondary | ICD-10-CM | POA: Diagnosis not present

## 2020-01-01 DIAGNOSIS — J323 Chronic sphenoidal sinusitis: Secondary | ICD-10-CM | POA: Diagnosis not present

## 2020-01-13 ENCOUNTER — Other Ambulatory Visit: Payer: Self-pay

## 2020-01-13 ENCOUNTER — Encounter (INDEPENDENT_AMBULATORY_CARE_PROVIDER_SITE_OTHER): Payer: Self-pay | Admitting: Otolaryngology

## 2020-01-13 ENCOUNTER — Ambulatory Visit (INDEPENDENT_AMBULATORY_CARE_PROVIDER_SITE_OTHER): Payer: Medicare HMO | Admitting: Otolaryngology

## 2020-01-13 VITALS — Temp 97.5°F

## 2020-01-13 DIAGNOSIS — J310 Chronic rhinitis: Secondary | ICD-10-CM | POA: Diagnosis not present

## 2020-01-13 DIAGNOSIS — J322 Chronic ethmoidal sinusitis: Secondary | ICD-10-CM | POA: Diagnosis not present

## 2020-01-13 NOTE — Progress Notes (Signed)
HPI: John Fry is a 79 y.o. male who presents is referred by his PCP for evaluation of chronic sinus disease.  Patient describes left-sided headache and pain mostly around the left periorbital area.  He has been on 2 rounds of antibiotics including Augmentin.  He had a CT scan of his sinuses performed on January 01, 2020.  I reviewed this with the patient in the office today and this showed only minimal sinus disease with clear frontal sinus region and minimal left ethmoid disease.Marland Kitchen  Past Medical History:  Diagnosis Date  . Diabetes mellitus without complication ([place])   . DYSLIPIDEMIA 03/05/2007  . Helicobacter pylori (H. pylori) 09/25/2013  . HERNIA 03/05/2007  . HYPERTENSION 03/05/2007  . 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 RENAL INSUFFICIENCY 05/22/2008  . S/P radiation therapy 04/14/2014 through 06/11/2014                                                      Prostate 7800 cGy in 40 sessions, seminal vesicles 5600 cGy in 40 sessions                        . SHOULDER PAIN, RIGHT 05/08/2008  . VENTRICULAR HYPERTROPHY, LEFT 05/08/2008   Past Surgical History:  Procedure Laterality Date  . FOOT SURGERY  1999   Right Foot Bunion Repair  . Lower Arterial  05/06/2003  . PROSTATE BIOPSY  07/23/13   gleason 7, volume 53 ml  . remove fatty tumor Left 1995   thigh   Social History   Socioeconomic History  . Marital status: Married    Spouse name: Not on file  . Number of children: Not on file  . Years of education: Not on file  . Highest education level: Not on file  Occupational History    Comment: Retired  Tobacco Use  . Smoking status: Former Smoker    Packs/day: 0.50    Years: 20.00    Pack years: 10.00    Quit date: 06/20/2006    Years since quitting: 13.5  . Smokeless tobacco: Never Used  Substance and Sexual Activity  . Alcohol use: No    Alcohol/week: 0.0 standard drinks  . Drug use: No  . Sexual activity: Not Currently  Other Topics Concern  .  Not on file  Social History Narrative  . Not on file   Social Determinants of Health   Financial Resource Strain:   . Difficulty of Paying Living Expenses:   Food Insecurity:   . Worried About Charity fundraiser in the Last Year:   . Arboriculturist in the Last Year:   Transportation Needs:   . Film/video editor (Medical):   Marland Kitchen Lack of Transportation (Non-Medical):   Physical Activity:   . Days of Exercise per Week:   . Minutes of Exercise per Session:   Stress:   . Feeling of Stress :   Social Connections:   . Frequency of Communication with Friends and Family:   . Frequency of Social Gatherings with Friends and Family:   . Attends Religious Services:   . Active Member of Clubs or Organizations:   . Attends Archivist Meetings:   Marland Kitchen Marital Status:    Family History  Problem Relation Age of  Onset  . Cancer Father 36       prostate  . Kidney Stones Brother   . Diabetes Sister   . Hypertension Neg Hx   . Colon cancer Neg Hx    Allergies  Allergen Reactions  . Zestril [Lisinopril]     angioedema   Prior to Admission medications   Medication Sig Start Date End Date Taking? Authorizing Provider  Alcohol Swabs (B-D SINGLE USE SWABS REGULAR) PADS USE  DAILY 01/17/17  Yes Renato Shin, MD  amLODipine (NORVASC) 10 MG tablet Take 1 tablet by mouth once daily 12/25/19  Yes Libby Maw, MD  amoxicillin-clavulanate (AUGMENTIN) 875-125 MG tablet Take 1 tablet by mouth 2 (two) times daily. 12/09/19  Yes Libby Maw, MD  atorvastatin (LIPITOR) 40 MG tablet TAKE 1 TABLET EVERY DAY 10/24/19  Yes Libby Maw, MD  blood glucose meter kit and supplies KIT Dispense based on patient and insurance preference. Use up to four times daily as directed. (FOR ICD-9 250.00, 250.01). 09/13/19  Yes Libby Maw, MD  fluticasone [facility]) 50 MCG/ACT nasal spray Place 1 spray into both nostrils daily. 08/30/19  Yes Bast, Traci A, NP   hydrochlorothiazide (HYDRODIURIL) 25 MG tablet Take 1 tablet by mouth once daily 09/06/19  Yes Libby Maw, MD  insulin NPH Human (NOVOLIN N) 100 UNIT/ML injection Inject 0.4 mLs (40 Units total) into the skin at bedtime. 09/09/19  Yes Renato Shin, MD  insulin regular (HUMULIN R) 100 units/mL injection Inject 0.25 mLs (25 Units total) into the skin 2 (two) times daily before a meal. 03/15/17  Yes Renato Shin, MD  levofloxacin (LEVAQUIN) 500 MG tablet Take 1 tablet (500 mg total) by mouth daily. 12/27/19  Yes Libby Maw, MD  sildenafil (VIAGRA) 100 MG tablet Take 100 mg by mouth daily as needed.  05/04/19  Yes [provider]  TRUE METRIX BLOOD GLUCOSE TEST test strip CHECK BLOOD SUGAR TWICE DAILY (APPOINTMENT IS NEEDED FOR FURTHER REFILLS) 11/11/19  Yes Renato Shin, MD  TRUEPLUS LANCETS 30G MISC USE  TO CHECK BLOOD SUGAR TWICE DAILY 09/05/16  Yes Renato Shin, MD  hydrALAZINE (APRESOLINE) 50 MG tablet Take 1 tablet (50 mg total) by mouth 3 (three) times daily for 7 days. 06/25/19 07/02/19  Luetta Nutting, DO     Positive ROS: Otherwise negative  All other systems have been reviewed and were otherwise negative with the exception of those mentioned in the HPI and as above.  Physical Exam: Constitutional: Alert, well-appearing, no acute distress Ears: External ears without lesions or tenderness. Ear canals are clear bilaterally with intact, clear TMs bilaterally. Nasal: External nose without lesions.  Septum with minimal deformity and mild rhinitis.  Minimal clear mucus within the nasal cavity.  No polyps noted. Nasal endoscopy was performed in the office today and on nasal endoscopy both middle meatus regions were clear.  No polyps noted no mucopurulent discharge noted.  Sphenoid region was clear.  Posterior ethmoid area was clear with no active drainage noted. Oral: Lips and gums without lesions. Tongue and palate mucosa without lesions. Posterior oropharynx  clear. Neck: No palpable adenopathy or masses Respiratory: Breathing comfortably  Skin: No facial/neck lesions or rash noted.  Nasal/sinus endoscopy  Date/Time: 01/13/2020 6:34 PM Performed by: Rozetta Nunnery, MD Authorized by: Rozetta Nunnery, MD   Consent:    Consent obtained:  Verbal   Consent given by:  Patient Procedure details:    Indications: sino-nasal symptoms     Medication:  Afrin   Instrument: flexible fiberoptic nasal endoscope     Scope location: bilateral nare   Septum:    Deviation: deviated to the left     Severity of deviation: mild   Sinus:    Right middle meatus: normal     Left middle meatus: normal     Right nasopharynx: normal     Left nasopharynx: normal   Comments:     On nasal endoscopy both middle meatus regions were clear with no active mucosal purulent discharge noted.  Only mucus within the nose was clear.  No polyps noted.    Assessment: Chronic rhinitis with minimal sinus disease on review of recent CT scan  Plan: Placed him on Nasacort 2 sprays each nostril at night.  Also reviewed with him concerning using saline irrigation. Placed him on Ceftin 250 mg twice daily for 10 days. He will follow-up in 4 weeks for recheck.   Radene Journey, MD   CC:

## 2020-01-18 ENCOUNTER — Other Ambulatory Visit: Payer: Self-pay | Admitting: Endocrinology

## 2020-01-20 ENCOUNTER — Other Ambulatory Visit: Payer: Self-pay

## 2020-01-20 ENCOUNTER — Ambulatory Visit (INDEPENDENT_AMBULATORY_CARE_PROVIDER_SITE_OTHER): Payer: Medicare HMO | Admitting: Family Medicine

## 2020-01-20 ENCOUNTER — Encounter: Payer: Self-pay | Admitting: Family Medicine

## 2020-01-20 VITALS — BP 136/70 | HR 68 | Temp 98.0°F | Ht 67.0 in | Wt 219.4 lb

## 2020-01-20 DIAGNOSIS — H6192 Disorder of left external ear, unspecified: Secondary | ICD-10-CM | POA: Insufficient documentation

## 2020-01-20 NOTE — Progress Notes (Signed)
Established Patient Office Visit  Subjective:  Patient ID: John Fry, male    DOB: [birth year]  Age: 79 y.o. MRN: [id number]  CC:  Chief Complaint  Patient presents with  . Follow-up    3 week follow up     HPI John Fry presents for evaluation of a lesion on his left ear.  Lesion seems to be enlarging and has been present over the last several months.  Was not there at this time last year.  It is tender when he rolls over on it at night.  There is some spontaneous cracking and bleeding.  He did see ENT for follow-up of sinus disease.  He was started on Nasacort and invited for follow-up in 4 weeks.  Sinuses are doing much better.  Past Medical History:  Diagnosis Date  . Diabetes mellitus without complication ([place])   . DYSLIPIDEMIA 03/05/2007  . Helicobacter pylori (H. pylori) 09/25/2013  . HERNIA 03/05/2007  . HYPERTENSION 03/05/2007  . 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 RENAL INSUFFICIENCY 05/22/2008  . S/P radiation therapy 04/14/2014 through 06/11/2014                                                      Prostate 7800 cGy in 40 sessions, seminal vesicles 5600 cGy in 40 sessions                        . SHOULDER PAIN, RIGHT 05/08/2008  . 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 FOOT SURGERY  1999   Right Foot Bunion Repair  . Lower Arterial  05/06/2003  . PROSTATE BIOPSY  07/23/13   gleason 7, volume 53 ml  . remove fatty tumor Left 1995   thigh    Family History  Problem Relation Age of Onset  . Cancer Father 54       prostate  . Kidney Stones Brother   . Diabetes Sister   . Hypertension Neg Hx   . Colon cancer Neg Hx     Social History   Socioeconomic History  . Marital status: Married    Spouse name: Not on file  . Number of children: Not on file  . Years of education: Not on file  . Highest education level: Not on file  Occupational History    Comment:  Retired  Tobacco Use  . Smoking status: Former Smoker    Packs/day: 0.50    Years: 20.00    Pack years: 10.00    Quit date: 06/20/2006    Years since quitting: 13.5  . Smokeless tobacco: Never Used  Substance and Sexual Activity  . Alcohol use: No    Alcohol/week: 0.0 standard drinks  . Drug use: No  . Sexual activity: Not Currently  Other Topics Concern  . Not on file  Social History Narrative  . Not on file   Social Determinants of Health   Financial Resource Strain:   . Difficulty of Paying Living Expenses:   Food Insecurity:   . Worried About Charity fundraiser in the Last Year:   . Arboriculturist in the Last Year:   Transportation Needs:   . Film/video editor (Medical):   Marland Kitchen Lack  of Transportation (Non-Medical):   Physical Activity:   . Days of Exercise per Week:   . Minutes of Exercise per Session:   Stress:   . Feeling of Stress :   Social Connections:   . Frequency of Communication with Friends and Family:   . Frequency of Social Gatherings with Friends and Family:   . Attends Religious Services:   . Active Member of Clubs or Organizations:   . Attends Archivist Meetings:   Marland Kitchen Marital Status:   Intimate Partner Violence:   . Fear of Current or Ex-Partner:   . Emotionally Abused:   Marland Kitchen Physically Abused:   . Sexually Abused:     Outpatient Medications Prior to Visit  Medication Sig Dispense Refill  . Alcohol Swabs (B-D SINGLE USE SWABS REGULAR) PADS USE  DAILY 200 each 0  . amLODipine (NORVASC) 10 MG tablet Take 1 tablet by mouth once daily 90 tablet 0  . amoxicillin-clavulanate (AUGMENTIN) 875-125 MG tablet Take 1 tablet by mouth 2 (two) times daily. 20 tablet 0  . atorvastatin (LIPITOR) 40 MG tablet TAKE 1 TABLET EVERY DAY 90 tablet 3  . blood glucose meter kit and supplies KIT Dispense based on patient and insurance preference. Use up to four times daily as directed. (FOR ICD-9 250.00, 250.01). 1 each 0  . hydrochlorothiazide (HYDRODIURIL)  25 MG tablet Take 1 tablet by mouth once daily 90 tablet 0  . insulin NPH Human (NOVOLIN N) 100 UNIT/ML injection Inject 0.4 mLs (40 Units total) into the skin at bedtime. 20 mL 11  . insulin regular (HUMULIN R) 100 units/mL injection Inject 0.25 mLs (25 Units total) into the skin 2 (two) times daily before a meal. 20 mL 11  . levofloxacin (LEVAQUIN) 500 MG tablet Take 1 tablet (500 mg total) by mouth daily. 10 tablet 0  . sildenafil (VIAGRA) 100 MG tablet Take 100 mg by mouth daily as needed.     . TRUE METRIX BLOOD GLUCOSE TEST test strip CHECK BLOOD SUGAR TWICE DAILY (APPOINTMENT IS NEEDED FOR FURTHER REFILLS) 200 strip 0  . TRUEPLUS LANCETS 30G MISC USE  TO CHECK BLOOD SUGAR TWICE DAILY 200 each 0  . fluticasone (FLONASE) 50 MCG/ACT nasal spray Place 1 spray into both nostrils daily. (Patient not taking: Reported on 01/20/2020) 16 g 2  . hydrALAZINE (APRESOLINE) 50 MG tablet Take 1 tablet (50 mg total) by mouth 3 (three) times daily for 7 days. 21 tablet 0   No facility-administered medications prior to visit.    Allergies  Allergen Reactions  . Zestril [Lisinopril]     angioedema    ROS Review of Systems  Constitutional: Negative.   HENT: Negative.   Eyes: Negative for photophobia and visual disturbance.  Respiratory: Negative.   Cardiovascular: Negative.   Gastrointestinal: Negative.   Skin: Positive for color change.      Objective:    Physical Exam Vitals and nursing note reviewed.  Constitutional:      General: He is not in acute distress.    Appearance: Normal appearance. He is not ill-appearing or toxic-appearing.  HENT:     Right Ear: External ear normal.  Eyes:     General:        Right eye: No discharge.        Left eye: No discharge.     Conjunctiva/sclera: Conjunctivae normal.  Pulmonary:     Effort: Pulmonary effort is normal.  Skin:      Neurological:     Mental  Status: He is alert and oriented to person, place, and time.  Psychiatric:        Mood  and Affect: Mood normal.        Behavior: Behavior normal.     BP 136/70   Pulse 68   Temp 98 F (36.7 C) (Tympanic)   Ht 5' 7"  (1.702 m)   Wt 219 lb 6.4 oz (99.5 kg)   SpO2 95%   BMI 34.36 kg/m  Wt Readings from Last 3 Encounters:  01/20/20 219 lb 6.4 oz (99.5 kg)  12/27/19 217 lb 3.2 oz (98.5 kg)  12/18/19 217 lb 3.2 oz (98.5 kg)     Health Maintenance Due  Topic Date Due  . Hepatitis C Screening  Never done  . COVID-19 Vaccine (1) Never done  . COLONOSCOPY  09/30/2018  . URINE MICROALBUMIN  04/07/2019  . FOOT EXAM  07/11/2019  . INFLUENZA VACCINE  01/19/2020    There are no preventive care reminders to display for this patient.  Lab Results  Component Value Date   TSH 1.68 04/06/2018   Lab Results  Component Value Date   WBC 7.5 09/11/2019   HGB 13.5 09/11/2019   HCT 40.0 09/11/2019   MCV 94.2 09/11/2019   PLT 213.0 09/11/2019   Lab Results  Component Value Date   NA 139 09/11/2019   K 4.3 09/11/2019   CO2 28 09/11/2019   GLUCOSE 122 (H) 09/11/2019   BUN 18 09/11/2019   CREATININE 1.61 (H) 09/11/2019   BILITOT 0.6 09/11/2019   ALKPHOS 63 09/11/2019   AST 24 09/11/2019   ALT 25 09/11/2019   PROT 6.8 09/11/2019   ALBUMIN 4.4 09/11/2019   CALCIUM 9.4 09/11/2019   GFR 50.30 (L) 09/11/2019   Lab Results  Component Value Date   CHOL 135 09/11/2019   Lab Results  Component Value Date   HDL 40.60 09/11/2019   Lab Results  Component Value Date   LDLCALC 71 09/11/2019   Lab Results  Component Value Date   TRIG 116.0 09/11/2019   Lab Results  Component Value Date   CHOLHDL 3 09/11/2019   Lab Results  Component Value Date   HGBA1C 7.2 (A) 11/12/2019      Assessment & Plan:   Problem List Items Addressed This Visit      Nervous and Auditory   Lesion of left external ear - Primary   Relevant Orders   Ambulatory referral to Dermatology      No orders of the defined types were placed in this encounter.   Follow-up: No follow-ups  on file.   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 Have asked for timely appointment with dermatology.  We will follow-up with me in the next month or 2 for a physical exam and follow-up of his medical issues. Libby Maw, MD

## 2020-02-11 DIAGNOSIS — E119 Type 2 diabetes mellitus without complications: Secondary | ICD-10-CM | POA: Diagnosis not present

## 2020-02-11 DIAGNOSIS — H5712 Ocular pain, left eye: Secondary | ICD-10-CM | POA: Diagnosis not present

## 2020-02-11 DIAGNOSIS — H40013 Open angle with borderline findings, low risk, bilateral: Secondary | ICD-10-CM | POA: Diagnosis not present

## 2020-02-11 DIAGNOSIS — H35033 Hypertensive retinopathy, bilateral: Secondary | ICD-10-CM | POA: Diagnosis not present

## 2020-02-19 ENCOUNTER — Other Ambulatory Visit: Payer: Self-pay

## 2020-02-19 ENCOUNTER — Encounter (INDEPENDENT_AMBULATORY_CARE_PROVIDER_SITE_OTHER): Payer: Self-pay | Admitting: Otolaryngology

## 2020-02-19 ENCOUNTER — Ambulatory Visit (INDEPENDENT_AMBULATORY_CARE_PROVIDER_SITE_OTHER): Payer: Medicare HMO | Admitting: Otolaryngology

## 2020-02-19 VITALS — Temp 97.3°F

## 2020-02-19 DIAGNOSIS — J322 Chronic ethmoidal sinusitis: Secondary | ICD-10-CM

## 2020-02-19 NOTE — Progress Notes (Signed)
HPI: John Fry is a 79 y.o. male who returns today for evaluation of left sided sinus symptoms.  He describes pain discomfort in the left cheek and around the left eye.  These his symptoms initially began  5 months ago and he had no symptoms prior to this.  He had a previous CT scan performed 6 weeks ago that showed obstruction of the left [facility] region with minimal disease in the ethmoid and no disease in the frontal region.  He states that he recently spent several weeks in [place] and as long as he was in [place] he had no symptoms but since returning back to [place] symptoms have recurred.  He has had minimal mucus discharge from his nose.  He has been using Nasacort. He has history of insulin-dependent diabetes..  Past Medical History:  Diagnosis Date  . Diabetes mellitus without complication ([place])   . DYSLIPIDEMIA 03/05/2007  . Helicobacter pylori (H. pylori) 09/25/2013  . HERNIA 03/05/2007  . HYPERTENSION 03/05/2007  . 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 RENAL INSUFFICIENCY 05/22/2008  . S/P radiation therapy 04/14/2014 through 06/11/2014                                                      Prostate 7800 cGy in 40 sessions, seminal vesicles 5600 cGy in 40 sessions                        . SHOULDER PAIN, RIGHT 05/08/2008  . VENTRICULAR HYPERTROPHY, LEFT 05/08/2008   Past Surgical History:  Procedure Laterality Date  . FOOT SURGERY  1999   Right Foot Bunion Repair  . Lower Arterial  05/06/2003  . PROSTATE BIOPSY  07/23/13   gleason 7, volume 53 ml  . remove fatty tumor Left 1995   thigh   Social History   Socioeconomic History  . Marital status: Married    Spouse name: Not on file  . Number of children: Not on file  . Years of education: Not on file  . Highest education level: Not on file  Occupational History    Comment: Retired  Tobacco Use  . Smoking status: Former Smoker    Packs/day: 0.50    Years: 20.00    Pack years: 10.00    Quit  date: 06/20/2006    Years since quitting: 13.6  . Smokeless tobacco: Never Used  Substance and Sexual Activity  . Alcohol use: No    Alcohol/week: 0.0 standard drinks  . Drug use: No  . Sexual activity: Not Currently  Other Topics Concern  . Not on file  Social History Narrative  . Not on file   Social Determinants of Health   Financial Resource Strain:   . Difficulty of Paying Living Expenses: Not on file  Food Insecurity:   . Worried About Charity fundraiser in the Last Year: Not on file  . Ran Out of Food in the Last Year: Not on file  Transportation Needs:   . Lack of Transportation (Medical): Not on file  . Lack of Transportation (Non-Medical): Not on file  Physical Activity:   . Days of Exercise per Week: Not on file  . Minutes of Exercise per Session: Not on file  Stress:   . Feeling of Stress :  Not on file  Social Connections:   . Frequency of Communication with Friends and Family: Not on file  . Frequency of Social Gatherings with Friends and Family: Not on file  . Attends Religious Services: Not on file  . Active Member of Clubs or Organizations: Not on file  . Attends Archivist Meetings: Not on file  . Marital Status: Not on file   Family History  Problem Relation Age of Onset  . Cancer Father 29       prostate  . Kidney Stones Brother   . Diabetes Sister   . Hypertension Neg Hx   . Colon cancer Neg Hx    Allergies  Allergen Reactions  . Zestril [Lisinopril]     angioedema   Prior to Admission medications   Medication Sig Start Date End Date Taking? Authorizing Provider  Alcohol Swabs (B-D SINGLE USE SWABS REGULAR) PADS USE  DAILY 01/17/17  Yes Renato Shin, MD  amLODipine (NORVASC) 10 MG tablet Take 1 tablet by mouth once daily 12/25/19  Yes Libby Maw, MD  amoxicillin-clavulanate (AUGMENTIN) 875-125 MG tablet Take 1 tablet by mouth 2 (two) times daily. 12/09/19  Yes Libby Maw, MD  atorvastatin (LIPITOR) 40 MG tablet  TAKE 1 TABLET EVERY DAY 10/24/19  Yes Libby Maw, MD  blood glucose meter kit and supplies KIT Dispense based on patient and insurance preference. Use up to four times daily as directed. (FOR ICD-9 250.00, 250.01). 09/13/19  Yes Libby Maw, MD  fluticasone [facility]) 50 MCG/ACT nasal spray Place 1 spray into both nostrils daily. 08/30/19  Yes Bast, Traci A, NP  hydrochlorothiazide (HYDRODIURIL) 25 MG tablet Take 1 tablet by mouth once daily 09/06/19  Yes Libby Maw, MD  insulin NPH Human (NOVOLIN N) 100 UNIT/ML injection Inject 0.4 mLs (40 Units total) into the skin at bedtime. 09/09/19  Yes Renato Shin, MD  insulin regular (HUMULIN R) 100 units/mL injection Inject 0.25 mLs (25 Units total) into the skin 2 (two) times daily before a meal. 03/15/17  Yes Renato Shin, MD  levofloxacin (LEVAQUIN) 500 MG tablet Take 1 tablet (500 mg total) by mouth daily. 12/27/19  Yes Libby Maw, MD  sildenafil (VIAGRA) 100 MG tablet Take 100 mg by mouth daily as needed.  05/04/19  Yes [provider]  TRUE METRIX BLOOD GLUCOSE TEST test strip CHECK BLOOD SUGAR TWICE DAILY (APPOINTMENT IS NEEDED FOR FURTHER REFILLS) 01/18/20  Yes Renato Shin, MD  TRUEPLUS LANCETS 30G MISC USE  TO CHECK BLOOD SUGAR TWICE DAILY 09/05/16  Yes Renato Shin, MD  hydrALAZINE (APRESOLINE) 50 MG tablet Take 1 tablet (50 mg total) by mouth 3 (three) times daily for 7 days. 06/25/19 07/02/19  Luetta Nutting, DO     Positive ROS: Otherwise negative  All other systems have been reviewed and were otherwise negative with the exception of those mentioned in the HPI and as above.  Physical Exam: Constitutional: Alert, well-appearing, no acute distress Ears: External ears without lesions or tenderness. Ear canals are clear bilaterally with intact, clear TMs.  Nasal: External nose without lesions. Septum midline with mild rhinitis..  After decongesting the nose nasal endoscopy was performed on the left  side in the middle meatus region was clear with no obvious mucopurulent discharge noted.  He had mild edema but cannot adequately visualize the maxillary ostia Oral: Lips and gums without lesions. Tongue and palate mucosa without lesions. Posterior oropharynx clear. Neck: No palpable adenopathy or masses Respiratory: Breathing comfortably  Skin: No facial/neck lesions or rash noted.  Nasal/sinus endoscopy  Date/Time: 02/19/2020 12:51 PM Performed by: Rozetta Nunnery, MD Authorized by: Rozetta Nunnery, MD   Consent:    Consent obtained:  Verbal   Consent given by:  Patient Procedure details:    Indications: sino-nasal symptoms     Medication:  Afrin   Instrument: flexible fiberoptic nasal endoscope     Scope location: bilateral nare   Sinus:    Right middle meatus: normal     Left middle meatus: normal     Right nasopharynx: normal     Left nasopharynx: normal   Comments:     On nasal endoscopy did not observe any obvious mucopurulent discharge from the left middle meatus or left maxillary sinus.  Does have mild edema.    Assessment: Left cheek and left periorbital discomfort which is intermittent.  Symptoms are consistent with "sinus" problems although could possibly be trigeminal neuralgia.  Plan: Recommended continued regular use of the Nasacort 2 sprays each nostril at night. Prescribed Ceftin 500 mg twice daily for 10 days. Also recommended regular use of saline irrigation into the mix of Afrin with the saline irrigation for 5 days to see if this helps resolve the symptoms. If he continues to have problems beyond 2 weeks he will call us back and we will plan on scheduling fusion protocol CT scan of the sinuses as surgical intervention may be warranted depending on results of scan. He would not be a good candidate for steroid use as he has insulin-dependent diabetes.   Radene Journey, MD

## 2020-02-27 DIAGNOSIS — C61 Malignant neoplasm of prostate: Secondary | ICD-10-CM | POA: Diagnosis not present

## 2020-03-02 ENCOUNTER — Other Ambulatory Visit: Payer: Self-pay

## 2020-03-02 DIAGNOSIS — R350 Frequency of micturition: Secondary | ICD-10-CM | POA: Diagnosis not present

## 2020-03-02 DIAGNOSIS — N5201 Erectile dysfunction due to arterial insufficiency: Secondary | ICD-10-CM | POA: Diagnosis not present

## 2020-03-02 DIAGNOSIS — N281 Cyst of kidney, acquired: Secondary | ICD-10-CM | POA: Diagnosis not present

## 2020-03-02 DIAGNOSIS — C61 Malignant neoplasm of prostate: Secondary | ICD-10-CM | POA: Diagnosis not present

## 2020-03-02 MED ORDER — HYDROCHLOROTHIAZIDE 25 MG PO TABS: 25.0000 mg | ORAL_TABLET | Freq: Every day | ORAL | 0 refills | Status: DC

## 2020-03-04 DIAGNOSIS — H40013 Open angle with borderline findings, low risk, bilateral: Secondary | ICD-10-CM | POA: Diagnosis not present

## 2020-03-04 DIAGNOSIS — H35033 Hypertensive retinopathy, bilateral: Secondary | ICD-10-CM | POA: Diagnosis not present

## 2020-03-11 ENCOUNTER — Other Ambulatory Visit: Payer: Self-pay | Admitting: Family Medicine

## 2020-03-11 ENCOUNTER — Other Ambulatory Visit: Payer: Self-pay

## 2020-03-12 NOTE — Telephone Encounter (Signed)
Please advise 

## 2020-03-13 ENCOUNTER — Ambulatory Visit (INDEPENDENT_AMBULATORY_CARE_PROVIDER_SITE_OTHER): Payer: Medicare HMO | Admitting: Family Medicine

## 2020-03-13 ENCOUNTER — Encounter: Payer: Self-pay | Admitting: Family Medicine

## 2020-03-13 ENCOUNTER — Other Ambulatory Visit: Payer: Self-pay

## 2020-03-13 VITALS — BP 122/64 | HR 77 | Temp 98.2°F | Ht 67.0 in | Wt 215.8 lb

## 2020-03-13 DIAGNOSIS — A599 Trichomoniasis, unspecified: Secondary | ICD-10-CM | POA: Diagnosis not present

## 2020-03-13 DIAGNOSIS — N1832 Chronic kidney disease, stage 3b: Secondary | ICD-10-CM | POA: Insufficient documentation

## 2020-03-13 DIAGNOSIS — C61 Malignant neoplasm of prostate: Secondary | ICD-10-CM

## 2020-03-13 DIAGNOSIS — I10 Essential (primary) hypertension: Secondary | ICD-10-CM

## 2020-03-13 DIAGNOSIS — E785 Hyperlipidemia, unspecified: Secondary | ICD-10-CM

## 2020-03-13 LAB — CBC
HCT: 41.8 % (ref 39.0–52.0)
Hemoglobin: 14.4 g/dL (ref 13.0–17.0)
MCHC: 34.5 g/dL (ref 30.0–36.0)
MCV: 93.2 fl (ref 78.0–100.0)
Platelets: 230 10*3/uL (ref 150.0–400.0)
RBC: 4.49 Mil/uL (ref 4.22–5.81)
RDW: 14.7 % (ref 11.5–15.5)
WBC: 7.7 10*3/uL (ref 4.0–10.5)

## 2020-03-13 LAB — COMPREHENSIVE METABOLIC PANEL
ALT: 20 U/L (ref 0–53)
AST: 17 U/L (ref 0–37)
Albumin: 4.8 g/dL (ref 3.5–5.2)
Alkaline Phosphatase: 72 U/L (ref 39–117)
BUN: 24 mg/dL — ABNORMAL HIGH (ref 6–23)
CO2: 26 mEq/L (ref 19–32)
Calcium: 9.9 mg/dL (ref 8.4–10.5)
Chloride: 101 mEq/L (ref 96–112)
Creatinine, Ser: 1.75 mg/dL — ABNORMAL HIGH (ref 0.40–1.50)
GFR: 45.63 mL/min — ABNORMAL LOW (ref >60.00)
Glucose, Bld: 188 mg/dL — ABNORMAL HIGH (ref 70–99)
Potassium: 3.8 mEq/L (ref 3.5–5.1)
Sodium: 137 mEq/L (ref 135–145)
Total Bilirubin: 0.5 mg/dL (ref 0.2–1.2)
Total Protein: 7.2 g/dL (ref 6.0–8.3)

## 2020-03-13 LAB — MICROALBUMIN / CREATININE URINE RATIO
Creatinine,U: 142.6 mg/dL
Microalb Creat Ratio: 2.6 mg/g (ref 0.0–30.0)
Microalb, Ur: 3.8 mg/dL — ABNORMAL HIGH (ref 0.0–1.9)

## 2020-03-13 LAB — URINALYSIS, ROUTINE W REFLEX MICROSCOPIC
Bilirubin Urine: NEGATIVE
Hgb urine dipstick: NEGATIVE
Ketones, ur: NEGATIVE
Leukocytes,Ua: NEGATIVE
Nitrite: NEGATIVE
RBC / HPF: NONE SEEN (ref 0–?)
Specific Gravity, Urine: 1.025 (ref 1.000–1.030)
Total Protein, Urine: NEGATIVE
Urine Glucose: NEGATIVE
Urobilinogen, UA: 0.2 (ref 0.0–1.0)
pH: 5.5 (ref 5.0–8.0)

## 2020-03-13 LAB — LIPID PANEL
Cholesterol: 139 mg/dL (ref 0–200)
HDL: 40.4 mg/dL (ref >39.00)
LDL Cholesterol: 68 mg/dL (ref 0–99)
NonHDL: 98.95
Total CHOL/HDL Ratio: 3
Triglycerides: 156 mg/dL — ABNORMAL HIGH (ref 0.0–149.0)
VLDL: 31.2 mg/dL (ref 0.0–40.0)

## 2020-03-13 MED ORDER — METRONIDAZOLE 500 MG PO TABS: ORAL_TABLET | ORAL | 0 refills | Status: DC

## 2020-03-13 NOTE — Progress Notes (Signed)
Established Patient Office Visit  Subjective:  Patient ID: John Fry, male    DOB: 1941/05/08  Age: 79 y.o. MRN: [id number]  CC:  Chief Complaint  Patient presents with  . Follow-up    6 month follow up patient states that his sinus still bothering him.    HPI John Fry presents for follow-up of his hypertension, hyperlipidemia stage IIIb kidney disease and ongoing facial pain.  Continues to experience discomfort in the left side of his face.  ENT is recommended special CT scanning with ongoing pain.  Patient is seen endocrinology regularly for his diabetes.  Status post recent eye check with ophthalmology.  Sees urology yearly for follow-up of his prostate cancer status post radiation therapy.  Blood pressure has been controlled well with amlodipine, HCTZ and hydralazine.  Continues atorvastatin for his elevated ldl cholesterol.  Past Medical History:  Diagnosis Date  . Diabetes mellitus without complication ([place])   . DYSLIPIDEMIA 03/05/2007  . Helicobacter pylori (H. pylori) 09/25/2013  . HERNIA 03/05/2007  . HYPERTENSION 03/05/2007  . 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 RENAL INSUFFICIENCY 05/22/2008  . S/P radiation therapy 04/14/2014 through 06/11/2014                                                      Prostate 7800 cGy in 40 sessions, seminal vesicles 5600 cGy in 40 sessions                        . SHOULDER PAIN, RIGHT 05/08/2008  . 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 FOOT SURGERY  1999   Right Foot Bunion Repair  . Lower Arterial  05/06/2003  . PROSTATE BIOPSY  07/23/13   gleason 7, volume 53 ml  . remove fatty tumor Left 1995   thigh    Family History  Problem Relation Age of Onset  . Cancer Father 76       prostate  . Kidney Stones Brother   . Diabetes Sister   . Hypertension Neg Hx   . Colon cancer Neg Hx     Social History   Socioeconomic History  .  Marital status: Married    Spouse name: Not on file  . Number of children: Not on file  . Years of education: Not on file  . Highest education level: Not on file  Occupational History    Comment: Retired  Tobacco Use  . Smoking status: Former Smoker    Packs/day: 0.50    Years: 20.00    Pack years: 10.00    Quit date: 06/20/2006    Years since quitting: 13.7  . Smokeless tobacco: Never Used  Substance and Sexual Activity  . Alcohol use: No    Alcohol/week: 0.0 standard drinks  . Drug use: No  . Sexual activity: Not Currently  Other Topics Concern  . Not on file  Social History Narrative  . Not on file   Social Determinants of Health   Financial Resource Strain:   . Difficulty of Paying Living Expenses: Not on file  Food Insecurity:   . Worried About Charity fundraiser in the Last Year: Not on file  . Ran Out of Food in the Last  Year: Not on file  Transportation Needs:   . Lack of Transportation (Medical): Not on file  . Lack of Transportation (Non-Medical): Not on file  Physical Activity:   . Days of Exercise per Week: Not on file  . Minutes of Exercise per Session: Not on file  Stress:   . Feeling of Stress : Not on file  Social Connections:   . Frequency of Communication with Friends and Family: Not on file  . Frequency of Social Gatherings with Friends and Family: Not on file  . Attends Religious Services: Not on file  . Active Member of Clubs or Organizations: Not on file  . Attends Archivist Meetings: Not on file  . Marital Status: Not on file  Intimate Partner Violence:   . Fear of Current or Ex-Partner: Not on file  . Emotionally Abused: Not on file  . Physically Abused: Not on file  . Sexually Abused: Not on file    Outpatient Medications Prior to Visit  Medication Sig Dispense Refill  . Alcohol Swabs (B-D SINGLE USE SWABS REGULAR) PADS USE  DAILY 200 each 0  . amLODipine (NORVASC) 10 MG tablet Take 1 tablet by mouth once daily 90 tablet 0    . atorvastatin (LIPITOR) 40 MG tablet TAKE 1 TABLET EVERY DAY 90 tablet 3  . blood glucose meter kit and supplies KIT Dispense based on patient and insurance preference. Use up to four times daily as directed. (FOR ICD-9 250.00, 250.01). 1 each 0  . fluticasone (FLONASE) 50 MCG/ACT nasal spray Place 1 spray into both nostrils daily. 16 g 2  . hydrochlorothiazide (HYDRODIURIL) 25 MG tablet Take 1 tablet (25 mg total) by mouth daily. 90 tablet 0  . insulin NPH Human (NOVOLIN N) 100 UNIT/ML injection Inject 0.4 mLs (40 Units total) into the skin at bedtime. 20 mL 11  . insulin regular (HUMULIN R) 100 units/mL injection Inject 0.25 mLs (25 Units total) into the skin 2 (two) times daily before a meal. 20 mL 11  . TRUE METRIX BLOOD GLUCOSE TEST test strip CHECK BLOOD SUGAR TWICE DAILY (APPOINTMENT IS NEEDED FOR FURTHER REFILLS) 200 strip 0  . TRUEPLUS LANCETS 30G MISC USE  TO CHECK BLOOD SUGAR TWICE DAILY 200 each 0  . levofloxacin (LEVAQUIN) 500 MG tablet Take 1 tablet (500 mg total) by mouth daily. (Patient not taking: Reported on 03/13/2020) 10 tablet 0  . sildenafil (VIAGRA) 100 MG tablet Take 100 mg by mouth daily as needed.  (Patient not taking: Reported on 03/13/2020)    . amoxicillin-clavulanate (AUGMENTIN) 875-125 MG tablet Take 1 tablet by mouth 2 (two) times daily. (Patient not taking: Reported on 03/13/2020) 20 tablet 0  . hydrALAZINE (APRESOLINE) 50 MG tablet TAKE 1 TABLET THREE TIMES DAILY (Patient not taking: Reported on 03/13/2020) 270 tablet 1   No facility-administered medications prior to visit.    Allergies  Allergen Reactions  . Zestril [Lisinopril]     angioedema    ROS Review of Systems  Constitutional: Negative for chills, diaphoresis, fatigue, fever and unexpected weight change.  HENT: Positive for congestion and sinus pain.   Eyes: Negative for photophobia and visual disturbance.  Respiratory: Negative.   Cardiovascular: Negative.   Gastrointestinal: Negative.    Endocrine: Negative for polyphagia and polyuria.  Genitourinary: Negative.   Musculoskeletal: Negative for gait problem and joint swelling.  Allergic/Immunologic: Negative for immunocompromised state.  Neurological: Negative for speech difficulty and light-headedness.  Hematological: Does not bruise/bleed easily.  Psychiatric/Behavioral: Negative.  Objective:    Physical Exam Vitals and nursing note reviewed.  Constitutional:      General: He is not in acute distress.    Appearance: Normal appearance. He is not ill-appearing, toxic-appearing or diaphoretic.  HENT:     Head: Normocephalic and atraumatic.      Right Ear: Tympanic membrane, ear canal and external ear normal.     Left Ear: Tympanic membrane, ear canal and external ear normal.     Mouth/Throat:     Mouth: Mucous membranes are moist.     Pharynx: Oropharynx is clear. No oropharyngeal exudate or posterior oropharyngeal erythema.   Eyes:     General: No scleral icterus.       Right eye: No discharge.        Left eye: No discharge.     Extraocular Movements: Extraocular movements intact.     Conjunctiva/sclera: Conjunctivae normal.     Pupils: Pupils are equal, round, and reactive to light.  Cardiovascular:     Rate and Rhythm: Normal rate and regular rhythm.  Pulmonary:     Effort: Pulmonary effort is normal.     Breath sounds: Normal breath sounds.  Abdominal:     General: Bowel sounds are normal.  Musculoskeletal:     Cervical back: No rigidity or tenderness.     Right lower leg: No edema.     Left lower leg: No edema.  Lymphadenopathy:     Cervical: No cervical adenopathy.  Skin:    General: Skin is warm and dry.  Neurological:     Mental Status: He is alert and oriented to person, place, and time.  Psychiatric:        Mood and Affect: Mood normal.        Behavior: Behavior normal.     BP 122/64   Pulse 77   Temp 98.2 F (36.8 C) (Tympanic)   Ht 5' 7" (1.702 m)   Wt 215 lb 12.8 oz (97.9  kg)   SpO2 96%   BMI 33.80 kg/m  Wt Readings from Last 3 Encounters:  03/13/20 215 lb 12.8 oz (97.9 kg)  01/20/20 219 lb 6.4 oz (99.5 kg)  12/27/19 217 lb 3.2 oz (98.5 kg)     Health Maintenance Due  Topic Date Due  . Hepatitis C Screening  Never done  . COLONOSCOPY  09/30/2018  . FOOT EXAM  07/11/2019  . INFLUENZA VACCINE  01/19/2020    There are no preventive care reminders to display for this patient.  Lab Results  Component Value Date   TSH 1.68 04/06/2018   Lab Results  Component Value Date   WBC 7.5 09/11/2019   HGB 13.5 09/11/2019   HCT 40.0 09/11/2019   MCV 94.2 09/11/2019   PLT 213.0 09/11/2019   Lab Results  Component Value Date   NA 139 09/11/2019   K 4.3 09/11/2019   CO2 28 09/11/2019   GLUCOSE 122 (H) 09/11/2019   BUN 18 09/11/2019   CREATININE 1.61 (H) 09/11/2019   BILITOT 0.6 09/11/2019   ALKPHOS 63 09/11/2019   AST 24 09/11/2019   ALT 25 09/11/2019   PROT 6.8 09/11/2019   ALBUMIN 4.4 09/11/2019   CALCIUM 9.4 09/11/2019   GFR 50.30 (L) 09/11/2019   Lab Results  Component Value Date   CHOL 135 09/11/2019   Lab Results  Component Value Date   HDL 40.60 09/11/2019   Lab Results  Component Value Date   LDLCALC 71 09/11/2019   Lab Results  Component Value Date   TRIG 116.0 09/11/2019   Lab Results  Component Value Date   CHOLHDL 3 09/11/2019   Lab Results  Component Value Date   HGBA1C 7.2 (A) 11/12/2019      Assessment & Plan:   Problem List Items Addressed This Visit      Cardiovascular and Mediastinum   Essential hypertension - Primary   Relevant Orders   CBC   Comprehensive metabolic panel   Microalbumin / creatinine urine ratio   Urinalysis, Routine w reflex microscopic     Genitourinary   Prostate cancer (HCC)   Stage 3b chronic kidney disease   Relevant Orders   Comprehensive metabolic panel     Other   Dyslipidemia   Relevant Orders   Comprehensive metabolic panel   Lipid panel      No orders of  the defined types were placed in this encounter.   Follow-up: Return in about 6 months (around 09/10/2020).  Continue all medicines as discussed above.  Agree with Dr. Lucia Gaskins that trigeminal neuralgia may be a consideration.  Advised the patient to call Dr. Pollie Friar office for the fusion CT scanning of his sinuses as indicated in his note.   Libby Maw, MD

## 2020-03-13 NOTE — Addendum Note (Signed)
Addended by: Jon Billings on: 03/13/2020 02:34 PM   Modules accepted: Orders

## 2020-03-16 ENCOUNTER — Ambulatory Visit: Payer: Medicare HMO | Admitting: Endocrinology

## 2020-03-16 ENCOUNTER — Telehealth: Payer: Self-pay | Admitting: Family Medicine

## 2020-03-16 NOTE — Telephone Encounter (Signed)
Patient is returning the call, please advise. CB is [phone]

## 2020-03-19 DIAGNOSIS — C44229 Squamous cell carcinoma of skin of left ear and external auricular canal: Secondary | ICD-10-CM | POA: Diagnosis not present

## 2020-03-19 DIAGNOSIS — D485 Neoplasm of uncertain behavior of skin: Secondary | ICD-10-CM | POA: Diagnosis not present

## 2020-03-25 ENCOUNTER — Other Ambulatory Visit: Payer: Self-pay | Admitting: Endocrinology

## 2020-03-25 ENCOUNTER — Ambulatory Visit (INDEPENDENT_AMBULATORY_CARE_PROVIDER_SITE_OTHER): Payer: Medicare HMO | Admitting: Endocrinology

## 2020-03-25 ENCOUNTER — Other Ambulatory Visit: Payer: Self-pay

## 2020-03-25 ENCOUNTER — Encounter: Payer: Self-pay | Admitting: Endocrinology

## 2020-03-25 VITALS — BP 144/70 | HR 70 | Ht 67.0 in | Wt 218.0 lb

## 2020-03-25 DIAGNOSIS — E119 Type 2 diabetes mellitus without complications: Secondary | ICD-10-CM

## 2020-03-25 DIAGNOSIS — Z23 Encounter for immunization: Secondary | ICD-10-CM | POA: Diagnosis not present

## 2020-03-25 LAB — POCT GLYCOSYLATED HEMOGLOBIN (HGB A1C): Hemoglobin A1C: 8.1 % — AB (ref 4.0–5.6)

## 2020-03-25 MED ORDER — INSULIN NPH (HUMAN) (ISOPHANE) 100 UNIT/ML ~~LOC~~ SUSP
34.0000 [IU] | Freq: Every day | SUBCUTANEOUS | 11 refills | Status: DC
Start: 2020-03-25 — End: 2022-04-26

## 2020-03-25 MED ORDER — INSULIN REGULAR HUMAN 100 UNIT/ML IJ SOLN
26.0000 [IU] | Freq: Two times a day (BID) | INTRAMUSCULAR | 11 refills | Status: DC
Start: 2020-03-25 — End: 2020-06-25

## 2020-03-25 NOTE — Progress Notes (Signed)
Subjective:    Patient ID: John Fry, male    DOB: July 03, 1940, 79 y.o.   MRN: [id number]  HPI Pt returns for f/u of diabetes mellitus: DM type: 1 Dx'ed: 9201 Complications: renal insuff  Therapy: insulin since dx DKA: only at dx.   Severe hypoglycemia: never.  Pancreatitis: never.   SDOH: he takes human insulin, due to cost.   Other: he takes multiple daily injections.   Interval history: pt says he never misses the insulin.  no cbg record, but states cbg's vary from 94-170.  pt states he feels well in general. He had to reduce HS insulin to 30 units, due to hypoglycemia.  It is in general highest at lunch.   Past Medical History:  Diagnosis Date  . Diabetes mellitus without complication ([place])   . DYSLIPIDEMIA 03/05/2007  . Helicobacter pylori (H. pylori) 09/25/2013  . HERNIA 03/05/2007  . HYPERTENSION 03/05/2007  . 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 RENAL INSUFFICIENCY 05/22/2008  . S/P radiation therapy 04/14/2014 through 06/11/2014                                                      Prostate 7800 cGy in 40 sessions, seminal vesicles 5600 cGy in 40 sessions                        . SHOULDER PAIN, RIGHT 05/08/2008  . 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 FOOT SURGERY  1999   Right Foot Bunion Repair  . Lower Arterial  05/06/2003  . PROSTATE BIOPSY  07/23/13   gleason 7, volume 53 ml  . remove fatty tumor Left 1995   thigh    Social History   Socioeconomic History  . Marital status: Married    Spouse name: Not on file  . Number of children: Not on file  . Years of education: Not on file  . Highest education level: Not on file  Occupational History    Comment: Retired  Tobacco Use  . Smoking status: Former Smoker    Packs/day: 0.50    Years: 20.00    Pack years: 10.00    Quit date: 06/20/2006    Years since quitting: 13.7  . Smokeless tobacco: Never Used  Substance and  Sexual Activity  . Alcohol use: No    Alcohol/week: 0.0 standard drinks  . Drug use: No  . Sexual activity: Not Currently  Other Topics Concern  . Not on file  Social History Narrative  . Not on file   Social Determinants of Health   Financial Resource Strain:   . Difficulty of Paying Living Expenses: Not on file  Food Insecurity:   . Worried About Charity fundraiser in the Last Year: Not on file  . Ran Out of Food in the Last Year: Not on file  Transportation Needs:   . Lack of Transportation (Medical): Not on file  . Lack of Transportation (Non-Medical): Not on file  Physical Activity:   . Days of Exercise per Week: Not on file  . Minutes of Exercise per Session: Not on file  Stress:   . Feeling of Stress : Not on file  Social Connections:   . Frequency of  Communication with Friends and Family: Not on file  . Frequency of Social Gatherings with Friends and Family: Not on file  . Attends Religious Services: Not on file  . Active Member of Clubs or Organizations: Not on file  . Attends Archivist Meetings: Not on file  . Marital Status: Not on file  Intimate Partner Violence:   . Fear of Current or Ex-Partner: Not on file  . Emotionally Abused: Not on file  . Physically Abused: Not on file  . Sexually Abused: Not on file    Current Outpatient Medications on File Prior to Visit  Medication Sig Dispense Refill  . Alcohol Swabs (B-D SINGLE USE SWABS REGULAR) PADS USE  DAILY 200 each 0  . amLODipine (NORVASC) 10 MG tablet Take 1 tablet by mouth once daily 90 tablet 0  . atorvastatin (LIPITOR) 40 MG tablet TAKE 1 TABLET EVERY DAY 90 tablet 3  . blood glucose meter kit and supplies KIT Dispense based on patient and insurance preference. Use up to four times daily as directed. (FOR ICD-9 250.00, 250.01). 1 each 0  . fluticasone (FLONASE) 50 MCG/ACT nasal spray Place 1 spray into both nostrils daily. 16 g 2  . hydrochlorothiazide (HYDRODIURIL) 25 MG tablet Take 1  tablet (25 mg total) by mouth daily. 90 tablet 0  . levofloxacin (LEVAQUIN) 500 MG tablet Take 1 tablet (500 mg total) by mouth daily. 10 tablet 0  . metroNIDAZOLE (FLAGYL) 500 MG tablet Take 4 tablets with food once. No alcohol 24 hours before and 24 hours afterwards. 4 tablet 0  . sildenafil (VIAGRA) 100 MG tablet Take 100 mg by mouth daily as needed.     . TRUEPLUS LANCETS 30G MISC USE  TO CHECK BLOOD SUGAR TWICE DAILY 200 each 0   No current facility-administered medications on file prior to visit.    Allergies  Allergen Reactions  . Zestril [Lisinopril]     angioedema    Family History  Problem Relation Age of Onset  . Cancer Father 80       prostate  . Kidney Stones Brother   . Diabetes Sister   . Hypertension Neg Hx   . Colon cancer Neg Hx     BP (!) 144/70   Pulse 70   Ht 5' 7"  (1.702 m)   Wt 218 lb (98.9 kg)   SpO2 97%   BMI 34.14 kg/m    Review of Systems Denies LOC    Objective:   Physical Exam VITAL SIGNS:  See vs page GENERAL: no distress Pulses: dorsalis pedis intact bilat.   MSK: no deformity of the feet CV: no leg edema Skin:  no ulcer on the feet.  normal color and temp on the feet.  Old healed surgical scar over the right foot bunion area.  Neuro: sensation is intact to touch on the feet Ext: there is bilateral onychomycosis of the toenails  Lab Results  Component Value Date   HGBA1C 8.1 (A) 03/25/2020        Assessment & Plan:  Type 1 DM, with CRI: uncontrolled Hypoglycemia, due to insulin: this limits aggressiveness of glycemic control  Patient Instructions  Please increase the insulins to the numbers listed below check your blood sugar twice a day.  vary the time of day when you check, between before the 3 meals, and at bedtime.  also check if you have symptoms of your blood sugar being too high or too low.  please keep a record of the readings  and bring it to your next appointment here (or you can bring the meter itself).  You can  write it on any piece of paper.  please call us sooner if your blood sugar goes below 70, or if you have a lot of readings over 200.   Please come back for a follow-up appointment in January.

## 2020-03-25 NOTE — Patient Instructions (Addendum)
Please increase the insulins to the numbers listed below check your blood sugar twice a day.  vary the time of day when you check, between before the 3 meals, and at bedtime.  also check if you have symptoms of your blood sugar being too high or too low.  please keep a record of the readings and bring it to your next appointment here (or you can bring the meter itself).  You can write it on any piece of paper.  please call us sooner if your blood sugar goes below 70, or if you have a lot of readings over 200.   Please come back for a follow-up appointment in January.

## 2020-03-30 ENCOUNTER — Other Ambulatory Visit: Payer: Self-pay | Admitting: Family Medicine

## 2020-04-01 ENCOUNTER — Other Ambulatory Visit: Payer: Self-pay

## 2020-04-01 ENCOUNTER — Encounter (INDEPENDENT_AMBULATORY_CARE_PROVIDER_SITE_OTHER): Payer: Self-pay | Admitting: Otolaryngology

## 2020-04-01 ENCOUNTER — Ambulatory Visit (INDEPENDENT_AMBULATORY_CARE_PROVIDER_SITE_OTHER): Payer: Medicare HMO | Admitting: Otolaryngology

## 2020-04-01 VITALS — Temp 97.5°F

## 2020-04-01 DIAGNOSIS — J322 Chronic ethmoidal sinusitis: Secondary | ICD-10-CM | POA: Diagnosis not present

## 2020-04-01 NOTE — Progress Notes (Signed)
HPI: John Fry is a 79 y.o. male who returns today for evaluation of chronic left paranasal pain and pressure.  He describes pain pressure in the left cheek area next to the nose.  He has some pressure around the left eye.  Denies any yellow-green discharge from his nose.  He has been using nasal steroid spray as well as completing the Ceftin.  He returns today because of persistent symptoms..  Past Medical History:  Diagnosis Date  . Diabetes mellitus without complication (HCC)   . DYSLIPIDEMIA 03/05/2007  . Helicobacter pylori (H. pylori) 09/25/2013  . HERNIA 03/05/2007  . HYPERTENSION 03/05/2007  . 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 RENAL INSUFFICIENCY 05/22/2008  . S/P radiation therapy 04/14/2014 through 06/11/2014                                                      Prostate 7800 cGy in 40 sessions, seminal vesicles 5600 cGy in 40 sessions                        . SHOULDER PAIN, RIGHT 05/08/2008  . VENTRICULAR HYPERTROPHY, LEFT 05/08/2008   Past Surgical History:  Procedure Laterality Date  . FOOT SURGERY  1999   Right Foot Bunion Repair  . Lower Arterial  05/06/2003  . PROSTATE BIOPSY  07/23/13   gleason 7, volume 53 ml  . remove fatty tumor Left 1995   thigh   Social History   Socioeconomic History  . Marital status: Married    Spouse name: Not on file  . Number of children: Not on file  . Years of education: Not on file  . Highest education level: Not on file  Occupational History    Comment: Retired  Tobacco Use  . Smoking status: Former Smoker    Packs/day: 0.50    Years: 20.00    Pack years: 10.00    Quit date: 06/20/2006    Years since quitting: 13.7  . Smokeless tobacco: Never Used  Substance and Sexual Activity  . Alcohol use: No    Alcohol/week: 0.0 standard drinks  . Drug use: No  . Sexual activity: Not Currently  Other Topics Concern  . Not on file  Social History Narrative  . Not on file   Social Determinants of Health    Financial Resource Strain:   . Difficulty of Paying Living Expenses: Not on file  Food Insecurity:   . Worried About Programme researcher, broadcasting/film/video in the Last Year: Not on file  . Ran Out of Food in the Last Year: Not on file  Transportation Needs:   . Lack of Transportation (Medical): Not on file  . Lack of Transportation (Non-Medical): Not on file  Physical Activity:   . Days of Exercise per Week: Not on file  . Minutes of Exercise per Session: Not on file  Stress:   . Feeling of Stress : Not on file  Social Connections:   . Frequency of Communication with Friends and Family: Not on file  . Frequency of Social Gatherings with Friends and Family: Not on file  . Attends Religious Services: Not on file  . Active Member of Clubs or Organizations: Not on file  . Attends Banker Meetings: Not on file  .  Marital Status: Not on file   Family History  Problem Relation Age of Onset  . Cancer Father 52       prostate  . Kidney Stones Brother   . Diabetes Sister   . Hypertension Neg Hx   . Colon cancer Neg Hx    Allergies  Allergen Reactions  . Zestril [Lisinopril]     angioedema   Prior to Admission medications   Medication Sig Start Date End Date Taking? Authorizing Provider  Alcohol Swabs (B-D SINGLE USE SWABS REGULAR) PADS USE  DAILY 01/17/17  Yes Renato Shin, MD  amLODipine (NORVASC) 10 MG tablet Take 1 tablet by mouth once daily 03/30/20  Yes Libby Maw, MD  atorvastatin (LIPITOR) 40 MG tablet TAKE 1 TABLET EVERY DAY 10/24/19  Yes Libby Maw, MD  blood glucose meter kit and supplies KIT Dispense based on patient and insurance preference. Use up to four times daily as directed. (FOR ICD-9 250.00, 250.01). 09/13/19  Yes Libby Maw, MD  fluticasone [facility]) 50 MCG/ACT nasal spray Place 1 spray into both nostrils daily. 08/30/19  Yes Bast, Traci A, NP  hydrochlorothiazide (HYDRODIURIL) 25 MG tablet Take 1 tablet (25 mg total) by mouth daily.  03/02/20  Yes Libby Maw, MD  insulin NPH Human (NOVOLIN N) 100 UNIT/ML injection Inject 0.34 mLs (34 Units total) into the skin at bedtime. 03/25/20  Yes Renato Shin, MD  insulin regular (HUMULIN R) 100 units/mL injection Inject 0.26 mLs (26 Units total) into the skin 2 (two) times daily before a meal. 03/25/20  Yes Renato Shin, MD  levofloxacin (LEVAQUIN) 500 MG tablet Take 1 tablet (500 mg total) by mouth daily. 12/27/19  Yes Libby Maw, MD  metroNIDAZOLE (FLAGYL) 500 MG tablet Take 4 tablets with food once. No alcohol 24 hours before and 24 hours afterwards. 03/13/20  Yes Libby Maw, MD  sildenafil (VIAGRA) 100 MG tablet Take 100 mg by mouth daily as needed.  05/04/19  Yes [provider]  TRUE METRIX BLOOD GLUCOSE TEST test strip CHECK BLOOD SUGAR TWICE DAILY (APPOINTMENT IS NEEDED FOR FURTHER REFILLS) 03/26/20  Yes Renato Shin, MD  TRUEPLUS LANCETS 30G MISC USE  TO CHECK BLOOD SUGAR TWICE DAILY 09/05/16  Yes Renato Shin, MD     Positive ROS: Otherwise negative  All other systems have been reviewed and were otherwise negative with the exception of those mentioned in the HPI and as above.  Physical Exam: Constitutional: Alert, well-appearing, no acute distress Ears: External ears without lesions or tenderness. Ear canals are clear bilaterally with intact, clear TMs.  Nasal: External nose without lesions. Septum with minimal deformity but moderate rhinitis with swollen mucous membranes.  After decongesting the nose with Afrin the middle meatus region was clear with no obvious mucopurulent discharge noted..  Oral: Lips and gums without lesions. Tongue and palate mucosa without lesions. Posterior oropharynx clear. Neck: No palpable adenopathy or masses Respiratory: Breathing comfortably Lungs clear to auscultation Cardiac exam: Regular rate and rhythm without murmur Skin: No facial/neck lesions or rash  noted.  Procedures  Assessment: Questionable sinus disease causing pain pressure versus possible trigeminal neuralgia.  Plan: Patient had a previous CT scan performed in July that showed minimal disease. We will plan on repeating the CT scan in the meantime placed him on Levaquin 500 mg daily for 10 days. He will call us after he has had a CT scan.   Radene Journey, MD

## 2020-04-02 ENCOUNTER — Other Ambulatory Visit (INDEPENDENT_AMBULATORY_CARE_PROVIDER_SITE_OTHER): Payer: Self-pay

## 2020-04-02 DIAGNOSIS — J320 Chronic maxillary sinusitis: Secondary | ICD-10-CM

## 2020-04-06 ENCOUNTER — Other Ambulatory Visit: Payer: Self-pay

## 2020-04-06 ENCOUNTER — Ambulatory Visit
Admission: RE | Admit: 2020-04-06 | Discharge: 2020-04-06 | Disposition: A | Discharge status: Home / Self Care | Payer: Medicare HMO | Source: Ambulatory Visit | Attending: Otolaryngology | Admitting: Otolaryngology

## 2020-04-06 DIAGNOSIS — J3489 Other specified disorders of nose and nasal sinuses: Secondary | ICD-10-CM | POA: Diagnosis not present

## 2020-04-06 DIAGNOSIS — J320 Chronic maxillary sinusitis: Secondary | ICD-10-CM | POA: Diagnosis not present

## 2020-04-06 DIAGNOSIS — J0100 Acute maxillary sinusitis, unspecified: Secondary | ICD-10-CM | POA: Diagnosis not present

## 2020-04-06 DIAGNOSIS — J321 Chronic frontal sinusitis: Secondary | ICD-10-CM | POA: Diagnosis not present

## 2020-04-15 NOTE — Progress Notes (Signed)
Subjective:   John Fry is a 79 y.o. male who presents for Medicare Annual/Subsequent preventive examination.   I connected with Martine today by telephone and verified that I am speaking with the correct person using two identifiers. Location patient: home Location provider: work Persons participating in the virtual visit: patient, Marine scientist.    I discussed the limitations, risks, security and privacy concerns of performing an evaluation and management service by telephone and the availability of in person appointments. I also discussed with the patient that there may be a patient responsible charge related to this service. The patient expressed understanding and verbally consented to this telephonic visit.    Interactive audio and video telecommunications were attempted between this provider and patient, however failed, due to patient having technical difficulties OR patient did not have access to video capability.  We continued and completed visit with audio only.  Some vital signs may be absent or patient reported.   Time Spent with patient on telephone encounter: 20 minutes  Review of Systems     Cardiac Risk Factors include: advanced age (>89mn, >>4women);diabetes mellitus;dyslipidemia;hypertension;male gender     Objective:    Today's Vitals   04/16/20 0946  Weight: 218 lb (98.9 kg)  Height: 5' 7"  (1.702 m)   Body mass index is 34.14 kg/m.  Advanced Directives 04/16/2020 04/06/2018 03/15/2017 09/30/2015 09/11/2014 07/10/2014 02/18/2014  Does Patient Have a Medical Advance Directive? Yes No No No No No No  Type of Advance Directive HTivoliin Chart? No - copy requested - - - - - -  Would patient like information on creating a medical advance directive? - - - - No - patient declined information - No - patient declined information    Current Medications (verified) Outpatient Encounter Medications  as of 04/16/2020  Medication Sig  . Alcohol Swabs (B-D SINGLE USE SWABS REGULAR) PADS USE  DAILY  . amLODipine (NORVASC) 10 MG tablet Take 1 tablet by mouth once daily  . atorvastatin (LIPITOR) 40 MG tablet TAKE 1 TABLET EVERY DAY  . blood glucose meter kit and supplies KIT Dispense based on patient and insurance preference. Use up to four times daily as directed. (FOR ICD-9 250.00, 250.01).  . fluticasone (FLONASE) 50 MCG/ACT nasal spray Place 1 spray into both nostrils daily.  . hydrochlorothiazide (HYDRODIURIL) 25 MG tablet Take 1 tablet (25 mg total) by mouth daily.  . insulin NPH Human (NOVOLIN N) 100 UNIT/ML injection Inject 0.34 mLs (34 Units total) into the skin at bedtime.  . insulin regular (HUMULIN R) 100 units/mL injection Inject 0.26 mLs (26 Units total) into the skin 2 (two) times daily before a meal.  . sildenafil (VIAGRA) 100 MG tablet Take 100 mg by mouth daily as needed.   . TRUE METRIX BLOOD GLUCOSE TEST test strip CHECK BLOOD SUGAR TWICE DAILY (APPOINTMENT IS NEEDED FOR FURTHER REFILLS)  . TRUEPLUS LANCETS 30G MISC USE  TO CHECK BLOOD SUGAR TWICE DAILY  . [DISCONTINUED] levofloxacin (LEVAQUIN) 500 MG tablet Take 1 tablet (500 mg total) by mouth daily.  . [DISCONTINUED] metroNIDAZOLE (FLAGYL) 500 MG tablet Take 4 tablets with food once. No alcohol 24 hours before and 24 hours afterwards.   No facility-administered encounter medications on file as of 04/16/2020.    Allergies (verified) Zestril [lisinopril]   History: Past Medical History:  Diagnosis Date  . Diabetes mellitus without complication ([place]   . DYSLIPIDEMIA  03/05/2007  . Helicobacter pylori (H. pylori) 09/25/2013  . HERNIA 03/05/2007  . HYPERTENSION 03/05/2007  . Prostate cancer ([place]) 07/23/13   Gleason 7, volume 53 ml  . RENAL INSUFFICIENCY 05/22/2008  . S/P radiation therapy 04/14/2014 through 06/11/2014                                                      Prostate 7800 cGy in 40 sessions, seminal vesicles  5600 cGy in 40 sessions                        . SHOULDER PAIN, RIGHT 05/08/2008  . VENTRICULAR HYPERTROPHY, LEFT 05/08/2008   Past Surgical History:  Procedure Laterality Date  . FOOT SURGERY  1999   Right Foot Bunion Repair  . Lower Arterial  05/06/2003  . PROSTATE BIOPSY  07/23/13   gleason 7, volume 53 ml  . remove fatty tumor Left 1995   thigh   Family History  Problem Relation Age of Onset  . Cancer Father 34       prostate  . Kidney Stones Brother   . Diabetes Sister   . Hypertension Neg Hx   . Colon cancer Neg Hx    Social History   Socioeconomic History  . Marital status: Widowed    Spouse name: Not on file  . Number of children: Not on file  . Years of education: Not on file  . Highest education level: Not on file  Occupational History    Comment: Retired  Tobacco Use  . Smoking status: Former Smoker    Packs/day: 0.50    Years: 20.00    Pack years: 10.00    Quit date: 06/20/2006    Years since quitting: 13.8  . Smokeless tobacco: Never Used  Substance and Sexual Activity  . Alcohol use: No    Alcohol/week: 0.0 standard drinks  . Drug use: No  . Sexual activity: Not Currently  Other Topics Concern  . Not on file  Social History Narrative  . Not on file   Social Determinants of Health   Financial Resource Strain: Low Risk   . Difficulty of Paying Living Expenses: Not hard at all  Food Insecurity: No Food Insecurity  . Worried About Charity fundraiser in the Last Year: Never true  . Ran Out of Food in the Last Year: Never true  Transportation Needs: No Transportation Needs  . Lack of Transportation (Medical): No  . Lack of Transportation (Non-Medical): No  Physical Activity: Sufficiently Active  . Days of Exercise per Week: 7 days  . Minutes of Exercise per Session: 40 min  Stress: No Stress Concern Present  . Feeling of Stress : Not at all  Social Connections: Moderately Isolated  . Frequency of Communication with Friends and Family: More than  three times a week  . Frequency of Social Gatherings with Friends and Family: Once a week  . Attends Religious Services: 1 to 4 times per year  . Active Member of Clubs or Organizations: No  . Attends Archivist Meetings: Never  . Marital Status: Widowed    Tobacco Counseling Counseling given: Not Answered   Clinical Intake:  Pre-visit preparation completed: Yes  Pain : No/denies pain     Nutritional Status: BMI > 30  Obese Nutritional Risks:  None Diabetes: Yes CBG done?: No Did pt. bring in CBG monitor from home?: No  How often do you need to have someone help you when you read instructions, pamphlets, or other written materials from your doctor or pharmacy?: 1 - Never What is the last grade level you completed in school?: 12th grade  Diabetes:  Is the patient diabetic? Yes If diabetic, was a CBG obtained today?  No  Did the patient bring in their glucometer from home?  No phone visit How often do you monitor your CBG's? Twice a day.   Financial Strains and Diabetes Management:  Are you having any financial strains with the device, your supplies or your medication? No .  Does the patient want to be seen by Chronic Care Management for management of their diabetes?  No  Would the patient like to be referred to a Nutritionist or for Diabetic Management?  No   Diabetic Exams:  Diabetic Eye Exam: Completed 04/24/2019.   Diabetic Foot Exam: Completed 03/25/2020     Interpreter Needed?: No  Information entered by :: Caroleen Hamman LPN   Activities of Daily Living In your present state of health, do you have any difficulty performing the following activities: 04/16/2020  Hearing? N  Vision? N  Difficulty concentrating or making decisions? N  Walking or climbing stairs? N  Dressing or bathing? N  Doing errands, shopping? N  Preparing Food and eating ? N  Using the Toilet? N  In the past six months, have you accidently leaked urine? N  Do you have  problems with loss of bowel control? N  Managing your Medications? N  Managing your Finances? N  Housekeeping or managing your Housekeeping? N  Some recent data might be hidden    Patient Care Team: Libby Maw, MD as PCP - General (Family Medicine) Arloa Koh, MD (Inactive) as Consulting Physician (Radiation Oncology) Marylynn Pearson, MD as Consulting Physician (Ophthalmology)  Indicate any recent Medical Services you may have received from other than [facility] providers in the past year (date may be approximate).     Assessment:   This is a routine wellness examination for John Fry.  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 Venetia Maxon  Dietary issues and exercise activities discussed: Current Exercise Habits: Home exercise routine, Type of exercise: walking, Time (Minutes): 45, Frequency (Times/Week): 7, Weekly Exercise (Minutes/Week): 315, Intensity: Mild, Exercise limited by: None identified  Goals    . Patient Stated     Maintain current health & current level of activity      Depression Screen PHQ 2/9 Scores 04/16/2020 10/02/2017  PHQ - 2 Score 0 0    Fall Risk Fall Risk  04/16/2020 09/11/2019 10/02/2017  Falls in the past year? 0 0 No  Number falls in past yr: 0 - -  Injury with Fall? 0 - -  Follow up Falls prevention discussed - -    Any stairs in or around the home? Yes  If so, are there any without handrails? No  Home free of loose throw rugs in walkways, pet beds, electrical cords, etc? Yes  Adequate lighting in your home to reduce risk of falls? Yes   ASSISTIVE DEVICES UTILIZED TO PREVENT FALLS:  Life alert? No  Use of a cane, walker or w/c? No  Grab bars in the bathroom? Yes  Shower chair or bench in shower? No  Elevated toilet seat  or a handicapped toilet? No   TIMED UP AND GO:  Was the test performed? No . Phone visit   Cognitive Function:No cognitive impairment noted        Immunizations Immunization History  Administered Date(s) Administered  . Fluad Quad(high Dose 65+) 03/08/2019, 03/25/2020  . Influenza Whole 05/08/2008  . Influenza, High Dose Seasonal PF 03/15/2017, 04/06/2018  . Influenza,inj,Quad PF,6+ Mos 05/01/2015  . Moderna SARS-COVID-2 Vaccination 08/28/2019, 09/27/2019  . Pneumococcal Conjugate-13 07/10/2014  . Pneumococcal Polysaccharide-23 05/08/2008  . Tdap 07/10/2014    TDAP status: Up to date   Flu Vaccine status: Up to date   Pneumococcal vaccine status: Up to date   Covid-19 vaccine status: Completed vaccines  Qualifies for Shingles Vaccine? Yes   Zostavax completed No   Shingrix Completed?: No.    Education has been provided regarding the importance of this vaccine. Patient has been advised to call insurance company to determine out of pocket expense if they have not yet received this vaccine. Advised may also receive vaccine at local pharmacy or Health Dept. Verbalized acceptance and understanding.  Screening Tests Health Maintenance  Topic Date Due  . Hepatitis C Screening  Never done  . OPHTHALMOLOGY EXAM  04/23/2020  . HEMOGLOBIN A1C  09/23/2020  . URINE MICROALBUMIN  03/13/2021  . FOOT EXAM  03/25/2021  . TETANUS/TDAP  07/10/2024  . INFLUENZA VACCINE  Completed  . COVID-19 Vaccine  Completed  . PNA vac Low Risk Adult  Completed  . COLONOSCOPY  Discontinued    Health Maintenance  Health Maintenance Due  Topic Date Due  . Hepatitis C Screening  Never done    Colorectal cancer screening: No longer required.   Lung Cancer Screening: (Low Dose CT Chest recommended if Age 64-80 years, 30 pack-year currently smoking OR have quit w/in 15years.) does not qualify.     Additional Screening:  Hepatitis C Screening: does qualify; phone visit-discuss with  PCP  Vision Screening: Recommended annual ophthalmology exams for early detection of glaucoma and other disorders of the eye. Is the patient up to date with their annual eye exam?  Yes  Who is the provider or what is the name of the office in which the patient attends annual eye exams? Dr. Venetia Maxon   Dental Screening: Recommended annual dental exams for proper oral hygiene  Community Resource Referral / Chronic Care Management: CRR required this visit?  No   CCM required this visit?  No      Plan:     I have personally reviewed and noted the following in the patient's chart:   . Medical and social history . Use of alcohol, tobacco or illicit drugs  . Current medications and supplements . Functional ability and status . Nutritional status . Physical activity . Advanced directives . List of other physicians . Hospitalizations, surgeries, and ER visits in previous 12 months . Vitals . Screenings to include cognitive, depression, and falls . Referrals and appointments  In addition, I have reviewed and discussed with patient certain preventive protocols, quality metrics, and best practice recommendations. A written personalized care plan for preventive services as well as general preventive health recommendations were provided to patient.   Due to this being a telephonic visit, the after visit summary with patients personalized plan was offered to patient via mail or  my-chart. per request, patient was mailed a copy of [place], LPN   [id number]  Nurse Health Advisor  Nurse Notes: None

## 2020-04-16 ENCOUNTER — Ambulatory Visit (INDEPENDENT_AMBULATORY_CARE_PROVIDER_SITE_OTHER): Payer: Medicare HMO

## 2020-04-16 VITALS — Ht 67.0 in | Wt 218.0 lb

## 2020-04-16 DIAGNOSIS — Z0000 Encounter for general adult medical examination without abnormal findings: Secondary | ICD-10-CM | POA: Diagnosis not present

## 2020-04-16 NOTE — Patient Instructions (Signed)
John Fry , Thank you for taking time to complete your Medicare Wellness Visit. I appreciate your ongoing commitment to your health goals. Please review the following plan we discussed and let me know if I can assist you in the future.   Screening recommendations/referrals: Colonoscopy: No longer required Recommended yearly ophthalmology/optometry visit for glaucoma screening and checkup Recommended yearly dental visit for hygiene and checkup  Vaccinations: Influenza vaccine: Up to Date Pneumococcal vaccine: Completed vaccines Tdap vaccine: Up to Date- Due-07/10/2024 Shingles vaccine: Discuss with pharmacy Covid-19: Completed vaccines  Advanced directives: Please bring a copy for your chart  Conditions/risks identified: See problem list  Next appointment: Follow up in one year for your annual wellness visit. 04/20/2021 @11 :15am  Preventive Care 80 Years and Older, Male Preventive care refers to lifestyle choices and visits with your health care provider that can promote health and wellness. What does preventive care include?  A yearly physical exam. This is also called an annual well check.  Dental exams once or twice a year.  Routine eye exams. Ask your health care provider how often you should have your eyes checked.  Personal lifestyle choices, including:  Daily care of your teeth and gums.  Regular physical activity.  Eating a healthy diet.  Avoiding tobacco and drug use.  Limiting alcohol use.  Practicing safe sex.  Taking low doses of aspirin every day.  Taking vitamin and mineral supplements as recommended by your health care provider. What happens during an annual well check? The services and screenings done by your health care provider during your annual well check will depend on your age, overall health, lifestyle risk factors, and family history of disease. Counseling  Your health care provider may ask you questions about your:  Alcohol  use.  Tobacco use.  Drug use.  Emotional well-being.  Home and relationship well-being.  Sexual activity.  Eating habits.  History of falls.  Memory and ability to understand (cognition).  Work and work Statistician. Screening  You may have the following tests or measurements:  Height, weight, and BMI.  Blood pressure.  Lipid and cholesterol levels. These may be checked every 5 years, or more frequently if you are over 63 years old.  Skin check.  Lung cancer screening. You may have this screening every year starting at age 80 if you have a 30-pack-year history of smoking and currently smoke or have quit within the past 15 years.  Fecal occult blood test (FOBT) of the stool. You may have this test every year starting at age 80.  Flexible sigmoidoscopy or colonoscopy. You may have a sigmoidoscopy every 5 years or a colonoscopy every 10 years starting at age 80.  Prostate cancer screening. Recommendations will vary depending on your family history and other risks.  Hepatitis C blood test.  Hepatitis B blood test.  Sexually transmitted disease (STD) testing.  Diabetes screening. This is done by checking your blood sugar (glucose) after you have not eaten for a while (fasting). You may have this done every 1-3 years.  Abdominal aortic aneurysm (AAA) screening. You may need this if you are a current or former smoker.  Osteoporosis. You may be screened starting at age 80 if you are at high risk. Talk with your health care provider about your test results, treatment options, and if necessary, the need for more tests. Vaccines  Your health care provider may recommend certain vaccines, such as:  Influenza vaccine. This is recommended every year.  Tetanus, diphtheria, and acellular pertussis (Tdap,  Td) vaccine. You may need a Td booster every 10 years.  Zoster vaccine. You may need this after age 80.  Pneumococcal 13-valent conjugate (PCV13) vaccine. One dose is  recommended after age 80.  Pneumococcal polysaccharide (PPSV23) vaccine. One dose is recommended after age 6. Talk to your health care provider about which screenings and vaccines you need and how often you need them. This information is not intended to replace advice given to you by your health care provider. Make sure you discuss any questions you have with your health care provider. Document Released: 07/03/2015 Document Revised: 02/24/2016 Document Reviewed: 04/07/2015 Elsevier Interactive Patient Education  2017 [place] Prevention in the Home Falls can cause injuries. They can happen to people of all ages. There are many things you can do to make your home safe and to help prevent falls. What can I do on the outside of my home?  Regularly fix the edges of walkways and driveways and fix any cracks.  Remove anything that might make you trip as you walk through a door, such as a raised step or threshold.  Trim any bushes or trees on the path to your home.  Use bright outdoor lighting.  Clear any walking paths of anything that might make someone trip, such as rocks or tools.  Regularly check to see if handrails are loose or broken. Make sure that both sides of any steps have handrails.  Any raised decks and porches should have guardrails on the edges.  Have any leaves, snow, or ice cleared regularly.  Use sand or salt on walking paths during winter.  Clean up any spills in your garage right away. This includes oil or grease spills. What can I do in the bathroom?  Use night lights.  Install grab bars by the toilet and in the tub and shower. Do not use towel bars as grab bars.  Use non-skid mats or decals in the tub or shower.  If you need to sit down in the shower, use a plastic, non-slip stool.  Keep the floor dry. Clean up any water that spills on the floor as soon as it happens.  Remove soap buildup in the tub or shower regularly.  Attach bath mats  securely with double-sided non-slip rug tape.  Do not have throw rugs and other things on the floor that can make you trip. What can I do in the bedroom?  Use night lights.  Make sure that you have a light by your bed that is easy to reach.  Do not use any sheets or blankets that are too big for your bed. They should not hang down onto the floor.  Have a firm chair that has side arms. You can use this for support while you get dressed.  Do not have throw rugs and other things on the floor that can make you trip. What can I do in the kitchen?  Clean up any spills right away.  Avoid walking on wet floors.  Keep items that you use a lot in easy-to-reach places.  If you need to reach something above you, use a strong step stool that has a grab bar.  Keep electrical cords out of the way.  Do not use floor polish or wax that makes floors slippery. If you must use wax, use non-skid floor wax.  Do not have throw rugs and other things on the floor that can make you trip. What can I do with my stairs?  Do not  leave any items on the stairs.  Make sure that there are handrails on both sides of the stairs and use them. Fix handrails that are broken or loose. Make sure that handrails are as long as the stairways.  Check any carpeting to make sure that it is firmly attached to the stairs. Fix any carpet that is loose or worn.  Avoid having throw rugs at the top or bottom of the stairs. If you do have throw rugs, attach them to the floor with carpet tape.  Make sure that you have a light switch at the top of the stairs and the bottom of the stairs. If you do not have them, ask someone to add them for you. What else can I do to help prevent falls?  Wear shoes that:  Do not have high heels.  Have rubber bottoms.  Are comfortable and fit you well.  Are closed at the toe. Do not wear sandals.  If you use a stepladder:  Make sure that it is fully opened. Do not climb a closed  stepladder.  Make sure that both sides of the stepladder are locked into place.  Ask someone to hold it for you, if possible.  Clearly mark and make sure that you can see:  Any grab bars or handrails.  First and last steps.  Where the edge of each step is.  Use tools that help you move around (mobility aids) if they are needed. These include:  Canes.  Walkers.  Scooters.  Crutches.  Turn on the lights when you go into a dark area. Replace any light bulbs as soon as they burn out.  Set up your furniture so you have a clear path. Avoid moving your furniture around.  If any of your floors are uneven, fix them.  If there are any pets around you, be aware of where they are.  Review your medicines with your doctor. Some medicines can make you feel dizzy. This can increase your chance of falling. Ask your doctor what other things that you can do to help prevent falls. This information is not intended to replace advice given to you by your health care provider. Make sure you discuss any questions you have with your health care provider. Document Released: 04/02/2009 Document Revised: 11/12/2015 Document Reviewed: 07/11/2014 Elsevier Interactive Patient Education  2017 [employer].

## 2020-04-17 ENCOUNTER — Telehealth (INDEPENDENT_AMBULATORY_CARE_PROVIDER_SITE_OTHER): Payer: Self-pay | Admitting: Otolaryngology

## 2020-04-17 NOTE — Telephone Encounter (Signed)
Called John Fry concerning results of his recent CT scan of the sinuses.  This showed minimal mucoperiosteal thickening within the left ethmoid left maxillary ostia region.  No air-fluid levels.  Minimal change from previous exam performed 3 months ago. Discussed with him concerning regular use of nasal steroid spray Nasacort 2 sprays each nostril at night as this should help resolve the congestion and pressure. He will follow-up if he has any recurrent problems.

## 2020-04-23 DIAGNOSIS — H5712 Ocular pain, left eye: Secondary | ICD-10-CM | POA: Diagnosis not present

## 2020-04-23 DIAGNOSIS — H401131 Primary open-angle glaucoma, bilateral, mild stage: Secondary | ICD-10-CM | POA: Diagnosis not present

## 2020-04-27 ENCOUNTER — Other Ambulatory Visit: Payer: Self-pay | Admitting: Family Medicine

## 2020-05-05 DIAGNOSIS — N1832 Chronic kidney disease, stage 3b: Secondary | ICD-10-CM | POA: Diagnosis not present

## 2020-05-11 DIAGNOSIS — N1832 Chronic kidney disease, stage 3b: Secondary | ICD-10-CM | POA: Diagnosis not present

## 2020-05-11 DIAGNOSIS — I129 Hypertensive chronic kidney disease with stage 1 through stage 4 chronic kidney disease, or unspecified chronic kidney disease: Secondary | ICD-10-CM | POA: Diagnosis not present

## 2020-05-11 DIAGNOSIS — E559 Vitamin D deficiency, unspecified: Secondary | ICD-10-CM | POA: Diagnosis not present

## 2020-05-11 DIAGNOSIS — E669 Obesity, unspecified: Secondary | ICD-10-CM | POA: Diagnosis not present

## 2020-05-11 DIAGNOSIS — N281 Cyst of kidney, acquired: Secondary | ICD-10-CM | POA: Diagnosis not present

## 2020-06-01 ENCOUNTER — Other Ambulatory Visit: Payer: Self-pay | Admitting: Endocrinology

## 2020-06-25 ENCOUNTER — Encounter: Payer: Self-pay | Admitting: Endocrinology

## 2020-06-25 ENCOUNTER — Ambulatory Visit: Payer: Medicare HMO | Admitting: Endocrinology

## 2020-06-25 ENCOUNTER — Other Ambulatory Visit: Payer: Self-pay

## 2020-06-25 VITALS — BP 142/88 | HR 74 | Ht 67.0 in | Wt 221.0 lb

## 2020-06-25 DIAGNOSIS — E119 Type 2 diabetes mellitus without complications: Secondary | ICD-10-CM

## 2020-06-25 LAB — POCT GLYCOSYLATED HEMOGLOBIN (HGB A1C): Hemoglobin A1C: 8.6 % — AB (ref 4.0–5.6)

## 2020-06-25 MED ORDER — INSULIN REGULAR HUMAN 100 UNIT/ML IJ SOLN
35.0000 [IU] | Freq: Two times a day (BID) | INTRAMUSCULAR | 11 refills | Status: DC
Start: 2020-06-25 — End: 2020-08-26

## 2020-06-25 MED ORDER — TRUE METRIX METER DEVI: 1.0000 | Freq: Once | 0 refills | Status: DC

## 2020-06-25 NOTE — Patient Instructions (Addendum)
Your blood pressure is high today.  Please see your primary care provider soon, to have it rechecked Please increase the regular insulin to 35 units twice a day (with meals).   check your blood sugar twice a day.  vary the time of day when you check, between before the 3 meals, and at bedtime.  also check if you have symptoms of your blood sugar being too high or too low.  please keep a record of the readings and bring it to your next appointment here (or you can bring the meter itself).  You can write it on any piece of paper.  please call us sooner if your blood sugar goes below 70, or if you have a lot of readings over 200.   I have sent a prescription to your pharmacy, for a new meter.  Please come back for a follow-up appointment in 2 months.

## 2020-06-25 NOTE — Progress Notes (Signed)
Subjective:    Patient ID: John Fry, male    DOB: [birth year], 80 y.o.   MRN: [id number]  HPI Pt returns for f/u of diabetes mellitus: DM type: 1 Dx'ed: 4825 Complications: CRI Therapy: insulin since dx DKA: only at dx.   Severe hypoglycemia: never.  Pancreatitis: never.   SDOH: he takes human insulin, due to cost.   Other: he takes multiple daily injections.   Interval history: pt says he never misses the insulin.  no cbg record, but states cbg's vary from 95-170.  pt states he feels well in general.  It is still in general highest at lunch.  He says 1 month ago, he increased Reg insulin to 33 units BID.   Past Medical History:  Diagnosis Date  . Diabetes mellitus without complication ([place])   . DYSLIPIDEMIA 03/05/2007  . Helicobacter pylori (H. pylori) 09/25/2013  . HERNIA 03/05/2007  . HYPERTENSION 03/05/2007  . 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 RENAL INSUFFICIENCY 05/22/2008  . S/P radiation therapy 04/14/2014 through 06/11/2014                                                      Prostate 7800 cGy in 40 sessions, seminal vesicles 5600 cGy in 40 sessions                        . SHOULDER PAIN, RIGHT 05/08/2008  . 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 FOOT SURGERY  1999   Right Foot Bunion Repair  . Lower Arterial  05/06/2003  . PROSTATE BIOPSY  07/23/13   gleason 7, volume 53 ml  . remove fatty tumor Left 1995   thigh    Social History   Socioeconomic History  . Marital status: Widowed    Spouse name: Not on file  . Number of children: Not on file  . Years of education: Not on file  . Highest education level: Not on file  Occupational History    Comment: Retired  Tobacco Use  . Smoking status: Former Smoker    Packs/day: 0.50    Years: 20.00    Pack years: 10.00    Quit date: 06/20/2006    Years since quitting: 14.0  . Smokeless tobacco: Never Used  Substance and  Sexual Activity  . Alcohol use: No    Alcohol/week: 0.0 standard drinks  . Drug use: No  . Sexual activity: Not Currently  Other Topics Concern  . Not on file  Social History Narrative  . Not on file   Social Determinants of Health   Financial Resource Strain: Low Risk   . Difficulty of Paying Living Expenses: Not hard at all  Food Insecurity: No Food Insecurity  . Worried About Charity fundraiser in the Last Year: Never true  . Ran Out of Food in the Last Year: Never true  Transportation Needs: No Transportation Needs  . Lack of Transportation (Medical): No  . Lack of Transportation (Non-Medical): No  Physical Activity: Sufficiently Active  . Days of Exercise per Week: 7 days  . Minutes of Exercise per Session: 40 min  Stress: No Stress Concern Present  . Feeling of Stress : Not at all  Social Connections: Moderately  Isolated  . Frequency of Communication with Friends and Family: More than three times a week  . Frequency of Social Gatherings with Friends and Family: Once a week  . Attends Religious Services: 1 to 4 times per year  . Active Member of Clubs or Organizations: No  . Attends Archivist Meetings: Never  . Marital Status: Widowed  Intimate Partner Violence: Not At Risk  . Fear of Current or Ex-Partner: No  . Emotionally Abused: No  . Physically Abused: No  . Sexually Abused: No    Current Outpatient Medications on File Prior to Visit  Medication Sig Dispense Refill  . Alcohol Swabs (B-D SINGLE USE SWABS REGULAR) PADS USE  DAILY 200 each 0  . amLODipine (NORVASC) 10 MG tablet Take 1 tablet by mouth once daily 90 tablet 1  . atorvastatin (LIPITOR) 40 MG tablet TAKE 1 TABLET EVERY DAY 90 tablet 3  . blood glucose meter kit and supplies KIT Dispense based on patient and insurance preference. Use up to four times daily as directed. (FOR ICD-9 250.00, 250.01). 1 each 0  . fluticasone (FLONASE) 50 MCG/ACT nasal spray Place 1 spray into both nostrils daily.  16 g 2  . hydrochlorothiazide (HYDRODIURIL) 25 MG tablet TAKE 1 TABLET EVERY DAY 90 tablet 0  . insulin NPH Human (NOVOLIN N) 100 UNIT/ML injection Inject 0.34 mLs (34 Units total) into the skin at bedtime. 20 mL 11  . sildenafil (VIAGRA) 100 MG tablet Take 100 mg by mouth daily as needed.      No current facility-administered medications on file prior to visit.    Allergies  Allergen Reactions  . Zestril [Lisinopril]     angioedema    Family History  Problem Relation Age of Onset  . Cancer Father 68       prostate  . Kidney Stones Brother   . Diabetes Sister   . Hypertension Neg Hx   . Colon cancer Neg Hx     BP (!) 142/88   Pulse 74   Ht _0  (1.702 m)   Wt 221 lb (100.2 kg)   SpO2 98%   BMI 34.61 kg/m    Review of Systems He denies hypoglycemia    Objective:   Physical Exam VITAL SIGNS:  See vs page GENERAL: no distress Pulses: dorsalis pedis intact bilat.   MSK: no deformity of the feet CV: no leg edema Skin:  no ulcer on the feet.  normal color and temp on the feet.  Old healed surgical scar over the right foot bunion area.  Neuro: sensation is intact to touch on the feet Ext: there is bilateral onychomycosis of the toenails.    Lab Results  Component Value Date   HGBA1C 8.6 (A) 06/25/2020       Assessment & Plan:  Type 1 DM, with CRI: uncontrolled.  HTN: is noted today.   Patient Instructions  Your blood pressure is high today.  Please see your primary care provider soon, to have it rechecked Please increase the regular insulin to 35 units twice a day (with meals).   check your blood sugar twice a day.  vary the time of day when you check, between before the 3 meals, and at bedtime.  also check if you have symptoms of your blood sugar being too high or too low.  please keep a record of the readings and bring it to your next appointment here (or you can bring the meter itself).  You can write it  on any piece of paper.  please call us sooner if your  blood sugar goes below 70, or if you have a lot of readings over 200.   I have sent a prescription to your pharmacy, for a new meter.  Please come back for a follow-up appointment in 2 months.

## 2020-07-11 ENCOUNTER — Other Ambulatory Visit: Payer: Self-pay | Admitting: Family Medicine

## 2020-08-03 ENCOUNTER — Other Ambulatory Visit: Payer: Self-pay | Admitting: Family Medicine

## 2020-08-04 DIAGNOSIS — H5712 Ocular pain, left eye: Secondary | ICD-10-CM | POA: Diagnosis not present

## 2020-08-04 DIAGNOSIS — H401131 Primary open-angle glaucoma, bilateral, mild stage: Secondary | ICD-10-CM | POA: Diagnosis not present

## 2020-08-05 ENCOUNTER — Other Ambulatory Visit: Payer: Self-pay | Admitting: Family Medicine

## 2020-08-24 ENCOUNTER — Other Ambulatory Visit: Payer: Self-pay

## 2020-08-25 ENCOUNTER — Other Ambulatory Visit: Payer: Self-pay | Admitting: Endocrinology

## 2020-08-26 ENCOUNTER — Other Ambulatory Visit: Payer: Self-pay

## 2020-08-26 ENCOUNTER — Ambulatory Visit: Payer: Medicare HMO | Admitting: Endocrinology

## 2020-08-26 VITALS — BP 160/78 | HR 79 | Ht 67.0 in | Wt 224.6 lb

## 2020-08-26 DIAGNOSIS — E119 Type 2 diabetes mellitus without complications: Secondary | ICD-10-CM

## 2020-08-26 LAB — POCT GLYCOSYLATED HEMOGLOBIN (HGB A1C): Hemoglobin A1C: 8.9 % — AB (ref 4.0–5.6)

## 2020-08-26 MED ORDER — INSULIN REGULAR HUMAN 100 UNIT/ML IJ SOLN
40.0000 [IU] | Freq: Two times a day (BID) | INTRAMUSCULAR | 11 refills | Status: DC
Start: 2020-08-26 — End: 2022-04-26

## 2020-08-26 NOTE — Patient Instructions (Addendum)
Your blood pressure is high today.  Please see your primary care provider soon, to have it rechecked Please increase the regular insulin to 40 units twice a day (with meals), and: Please continue the same NPH insulin check your blood sugar twice a day.  vary the time of day when you check, between before the 3 meals, and at bedtime.  also check if you have symptoms of your blood sugar being too high or too low.  please keep a record of the readings and bring it to your next appointment here (or you can bring the meter itself).  You can write it on any piece of paper.  please call us sooner if your blood sugar goes below 70, or if you have a lot of readings over 200.    Please come back for a follow-up appointment in 2 months.

## 2020-08-26 NOTE — Progress Notes (Signed)
Subjective:    Patient ID: John Fry, male    DOB: Oct 12, 1940, 80 y.o.   MRN: [id number]  HPI Pt returns for f/u of diabetes mellitus: DM type: 1 Dx'ed: 2831 Complications: CRI Therapy: insulin since dx DKA: only at dx.   Severe hypoglycemia: never.  Pancreatitis: never.   SDOH: he takes human insulin, due to cost.   Other: he takes multiple daily injections.   Interval history: pt says he never misses the insulin.  no cbg record, but states cbg's vary from 92-200.  pt states he feels well in general.  It is still in general highest at lunch.   Past Medical History:  Diagnosis Date  . Diabetes mellitus without complication ([place])   . DYSLIPIDEMIA 03/05/2007  . Helicobacter pylori (H. pylori) 09/25/2013  . HERNIA 03/05/2007  . HYPERTENSION 03/05/2007  . 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 RENAL INSUFFICIENCY 05/22/2008  . S/P radiation therapy 04/14/2014 through 06/11/2014                                                      Prostate 7800 cGy in 40 sessions, seminal vesicles 5600 cGy in 40 sessions                        . SHOULDER PAIN, RIGHT 05/08/2008  . 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 FOOT SURGERY  1999   Right Foot Bunion Repair  . Lower Arterial  05/06/2003  . PROSTATE BIOPSY  07/23/13   gleason 7, volume 53 ml  . remove fatty tumor Left 1995   thigh    Social History   Socioeconomic History  . Marital status: Widowed    Spouse name: Not on file  . Number of children: Not on file  . Years of education: Not on file  . Highest education level: Not on file  Occupational History    Comment: Retired  Tobacco Use  . Smoking status: Former Smoker    Packs/day: 0.50    Years: 20.00    Pack years: 10.00    Quit date: 06/20/2006    Years since quitting: 14.2  . Smokeless tobacco: Never Used  Substance and Sexual Activity  . Alcohol use: No    Alcohol/week: 0.0 standard  drinks  . Drug use: No  . Sexual activity: Not Currently  Other Topics Concern  . Not on file  Social History Narrative  . Not on file   Social Determinants of Health   Financial Resource Strain: Low Risk   . Difficulty of Paying Living Expenses: Not hard at all  Food Insecurity: No Food Insecurity  . Worried About Charity fundraiser in the Last Year: Never true  . Ran Out of Food in the Last Year: Never true  Transportation Needs: No Transportation Needs  . Lack of Transportation (Medical): No  . Lack of Transportation (Non-Medical): No  Physical Activity: Sufficiently Active  . Days of Exercise per Week: 7 days  . Minutes of Exercise per Session: 40 min  Stress: No Stress Concern Present  . Feeling of Stress : Not at all  Social Connections: Moderately Isolated  . Frequency of Communication with Friends and Family: More than three times  a week  . Frequency of Social Gatherings with Friends and Family: Once a week  . Attends Religious Services: 1 to 4 times per year  . Active Member of Clubs or Organizations: No  . Attends Archivist Meetings: Never  . Marital Status: Widowed  Intimate Partner Violence: Not At Risk  . Fear of Current or Ex-Partner: No  . Emotionally Abused: No  . Physically Abused: No  . Sexually Abused: No    Current Outpatient Medications on File Prior to Visit  Medication Sig Dispense Refill  . Alcohol Swabs (B-D SINGLE USE SWABS REGULAR) PADS USE  DAILY 200 each 0  . amLODipine (NORVASC) 10 MG tablet Take 1 tablet by mouth once daily 90 tablet 1  . atorvastatin (LIPITOR) 40 MG tablet TAKE 1 TABLET EVERY DAY 90 tablet 3  . fluticasone (FLONASE) 50 MCG/ACT nasal spray Place 1 spray into both nostrils daily. 16 g 2  . hydrALAZINE (APRESOLINE) 50 MG tablet TAKE 1 TABLET THREE TIMES DAILY 270 tablet 1  . hydrochlorothiazide (HYDRODIURIL) 25 MG tablet TAKE 1 TABLET EVERY DAY 90 tablet 0  . insulin NPH Human (NOVOLIN N) 100 UNIT/ML injection  Inject 0.34 mLs (34 Units total) into the skin at bedtime. 20 mL 11  . sildenafil (VIAGRA) 100 MG tablet Take 100 mg by mouth daily as needed.      No current facility-administered medications on file prior to visit.    Allergies  Allergen Reactions  . Zestril [Lisinopril]     angioedema    Family History  Problem Relation Age of Onset  . Cancer Father 64       prostate  . Kidney Stones Brother   . Diabetes Sister   . Hypertension Neg Hx   . Colon cancer Neg Hx     BP (!) 160/78 (BP Location: Right Arm, Patient Position: Sitting, Cuff Size: Normal)   Pulse 79   Ht 5\' 7"  (1.702 m)   Wt 224 lb 9.6 oz (101.9 kg)   SpO2 99%   BMI 35.18 kg/m    Review of Systems He denies hypoglycemia.      Objective:   Physical Exam VITAL SIGNS:  See vs page GENERAL: no distress Pulses: dorsalis pedis intact bilat.   MSK: no deformity of the feet CV: no leg edema Skin:  no ulcer on the feet.  normal color and temp on the feet.  Old healed surgical scar over the right foot bunion area.  Neuro: sensation is intact to touch on the feet Ext: there is bilateral onychomycosis of the toenails.   Lab Results  Component Value Date   HGBA1C 8.9 (A) 08/26/2020       Assessment & Plan:  Type 1 DM, with CRI: uncontrolled.   HTN: is noted today.   Patient Instructions  Your blood pressure is high today.  Please see your primary care provider soon, to have it rechecked Please increase the regular insulin to 40 units twice a day (with meals), and: Please continue the same NPH insulin check your blood sugar twice a day.  vary the time of day when you check, between before the 3 meals, and at bedtime.  also check if you have symptoms of your blood sugar being too high or too low.  please keep a record of the readings and bring it to your next appointment here (or you can bring the meter itself).  You can write it on any piece of paper.  please call us sooner  if your blood sugar goes below 70, or  if you have a lot of readings over 200.    Please come back for a follow-up appointment in 2 months.

## 2020-09-02 ENCOUNTER — Other Ambulatory Visit: Payer: Self-pay | Admitting: Endocrinology

## 2020-09-03 ENCOUNTER — Other Ambulatory Visit: Payer: Self-pay | Admitting: Family Medicine

## 2020-09-10 ENCOUNTER — Other Ambulatory Visit: Payer: Self-pay

## 2020-09-10 ENCOUNTER — Encounter: Payer: Self-pay | Admitting: Family Medicine

## 2020-09-10 ENCOUNTER — Ambulatory Visit (INDEPENDENT_AMBULATORY_CARE_PROVIDER_SITE_OTHER): Payer: Medicare HMO | Admitting: Family Medicine

## 2020-09-10 VITALS — BP 132/70 | HR 71 | Temp 98.0°F | Ht 67.0 in | Wt 225.8 lb

## 2020-09-10 DIAGNOSIS — E785 Hyperlipidemia, unspecified: Secondary | ICD-10-CM

## 2020-09-10 DIAGNOSIS — H6121 Impacted cerumen, right ear: Secondary | ICD-10-CM | POA: Diagnosis not present

## 2020-09-10 DIAGNOSIS — I10 Essential (primary) hypertension: Secondary | ICD-10-CM

## 2020-09-10 DIAGNOSIS — R519 Headache, unspecified: Secondary | ICD-10-CM | POA: Diagnosis not present

## 2020-09-10 DIAGNOSIS — Z8546 Personal history of malignant neoplasm of prostate: Secondary | ICD-10-CM | POA: Diagnosis not present

## 2020-09-10 DIAGNOSIS — N1832 Chronic kidney disease, stage 3b: Secondary | ICD-10-CM | POA: Diagnosis not present

## 2020-09-10 LAB — COMPREHENSIVE METABOLIC PANEL
ALT: 22 U/L (ref 0–53)
AST: 20 U/L (ref 0–37)
Albumin: 4.9 g/dL (ref 3.5–5.2)
Alkaline Phosphatase: 67 U/L (ref 39–117)
BUN: 20 mg/dL (ref 6–23)
CO2: 26 mEq/L (ref 19–32)
Calcium: 10.1 mg/dL (ref 8.4–10.5)
Chloride: 103 mEq/L (ref 96–112)
Creatinine, Ser: 1.74 mg/dL — ABNORMAL HIGH (ref 0.40–1.50)
GFR: 36.62 mL/min — ABNORMAL LOW (ref >60.00)
Glucose, Bld: 201 mg/dL — ABNORMAL HIGH (ref 70–99)
Potassium: 4.1 mEq/L (ref 3.5–5.1)
Sodium: 139 mEq/L (ref 135–145)
Total Bilirubin: 0.6 mg/dL (ref 0.2–1.2)
Total Protein: 7.3 g/dL (ref 6.0–8.3)

## 2020-09-10 LAB — URINALYSIS, ROUTINE W REFLEX MICROSCOPIC
Bilirubin Urine: NEGATIVE
Hgb urine dipstick: NEGATIVE
Ketones, ur: NEGATIVE
Leukocytes,Ua: NEGATIVE
Nitrite: NEGATIVE
Specific Gravity, Urine: 1.02 (ref 1.000–1.030)
Total Protein, Urine: NEGATIVE
Urine Glucose: NEGATIVE
Urobilinogen, UA: 0.2 (ref 0.0–1.0)
pH: 5.5 (ref 5.0–8.0)

## 2020-09-10 LAB — LIPID PANEL
Cholesterol: 156 mg/dL (ref 0–200)
HDL: 43.6 mg/dL (ref >39.00)
LDL Cholesterol: 78 mg/dL (ref 0–99)
NonHDL: 112.6
Total CHOL/HDL Ratio: 4
Triglycerides: 171 mg/dL — ABNORMAL HIGH (ref 0.0–149.0)
VLDL: 34.2 mg/dL (ref 0.0–40.0)

## 2020-09-10 LAB — CBC
HCT: 42.6 % (ref 39.0–52.0)
Hemoglobin: 14.6 g/dL (ref 13.0–17.0)
MCHC: 34.4 g/dL (ref 30.0–36.0)
MCV: 93 fl (ref 78.0–100.0)
Platelets: 231 10*3/uL (ref 150.0–400.0)
RBC: 4.58 Mil/uL (ref 4.22–5.81)
RDW: 13.8 % (ref 11.5–15.5)
WBC: 7.8 10*3/uL (ref 4.0–10.5)

## 2020-09-10 LAB — PSA: PSA: 0.06 ng/mL — ABNORMAL LOW (ref 0.10–4.00)

## 2020-09-10 NOTE — Progress Notes (Signed)
Established Patient Office Visit  Subjective:  Patient ID: WIATT MAHABIR, male    DOB: 1941-03-30  Age: 80 y.o. MRN: [id number]  CC:  Chief Complaint  Patient presents with  . Follow-up    6 month follow up, patient states that the left side of his face still hurting.     HPI Sylvain Hasten Mitten presents for follow-up of hypertension, elevated cholesterol and CKD.  Also he relates that he continues to experience left-sided facial pain.  The pain can come and go.  Denies an electrical shock type feeling.  He can produce the pain if he applies pressure to the left side of the bridge of his nose.  He is having no sinus symptoms currently.  Status post ENT consultation with 2 essentially noncontributory CT scans.  He has had no rash.  He has a dentulous in his upper mouth.  Past Medical History:  Diagnosis Date  . Diabetes mellitus without complication ([place])   . DYSLIPIDEMIA 03/05/2007  . Helicobacter pylori (H. pylori) 09/25/2013  . HERNIA 03/05/2007  . HYPERTENSION 03/05/2007  . 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 RENAL INSUFFICIENCY 05/22/2008  . S/P radiation therapy 04/14/2014 through 06/11/2014                                                      Prostate 7800 cGy in 40 sessions, seminal vesicles 5600 cGy in 40 sessions                        . SHOULDER PAIN, RIGHT 05/08/2008  . 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 FOOT SURGERY  1999   Right Foot Bunion Repair  . Lower Arterial  05/06/2003  . PROSTATE BIOPSY  07/23/13   gleason 7, volume 53 ml  . remove fatty tumor Left 1995   thigh    Family History  Problem Relation Age of Onset  . Cancer Father 70       prostate  . Kidney Stones Brother   . Diabetes Sister   . Hypertension Neg Hx   . Colon cancer Neg Hx     Social History   Socioeconomic History  . Marital status: Widowed    Spouse name: Not on file  . Number of children:  Not on file  . Years of education: Not on file  . Highest education level: Not on file  Occupational History    Comment: Retired  Tobacco Use  . Smoking status: Former Smoker    Packs/day: 0.50    Years: 20.00    Pack years: 10.00    Quit date: 06/20/2006    Years since quitting: 14.2  . Smokeless tobacco: Never Used  Substance and Sexual Activity  . Alcohol use: No    Alcohol/week: 0.0 standard drinks  . Drug use: No  . Sexual activity: Not Currently  Other Topics Concern  . Not on file  Social History Narrative  . Not on file   Social Determinants of Health   Financial Resource Strain: Low Risk   . Difficulty of Paying Living Expenses: Not hard at all  Food Insecurity: No Food Insecurity  . Worried About Charity fundraiser in the Last Year: Never true  .  Ran Out of Food in the Last Year: Never true  Transportation Needs: No Transportation Needs  . Lack of Transportation (Medical): No  . Lack of Transportation (Non-Medical): No  Physical Activity: Sufficiently Active  . Days of Exercise per Week: 7 days  . Minutes of Exercise per Session: 40 min  Stress: No Stress Concern Present  . Feeling of Stress : Not at all  Social Connections: Moderately Isolated  . Frequency of Communication with Friends and Family: More than three times a week  . Frequency of Social Gatherings with Friends and Family: Once a week  . Attends Religious Services: 1 to 4 times per year  . Active Member of Clubs or Organizations: No  . Attends Archivist Meetings: Never  . Marital Status: Widowed  Intimate Partner Violence: Not At Risk  . Fear of Current or Ex-Partner: No  . Emotionally Abused: No  . Physically Abused: No  . Sexually Abused: No    Outpatient Medications Prior to Visit  Medication Sig Dispense Refill  . Alcohol Swabs (B-D SINGLE USE SWABS REGULAR) PADS USE  DAILY 200 each 0  . amLODipine (NORVASC) 10 MG tablet TAKE 1 TABLET EVERY DAY 90 tablet 1  . atorvastatin  (LIPITOR) 40 MG tablet TAKE 1 TABLET EVERY DAY 90 tablet 3  . Blood Glucose Monitoring Suppl (TRUE METRIX METER) w/Device KIT USE AS DIRECTED 1 kit 0  . hydrALAZINE (APRESOLINE) 50 MG tablet TAKE 1 TABLET THREE TIMES DAILY 270 tablet 1  . hydrochlorothiazide (HYDRODIURIL) 25 MG tablet TAKE 1 TABLET EVERY DAY 90 tablet 0  . insulin NPH Human (NOVOLIN N) 100 UNIT/ML injection Inject 0.34 mLs (34 Units total) into the skin at bedtime. 20 mL 11  . insulin regular (HUMULIN R) 100 units/mL injection Inject 0.4 mLs (40 Units total) into the skin 2 (two) times daily before a meal. 80 mL 11  . sildenafil (VIAGRA) 100 MG tablet Take 100 mg by mouth daily as needed.     . TRUE METRIX BLOOD GLUCOSE TEST test strip CHECK BLOOD SUGAR TWICE DAILY (APPOINTMENT IS NEEDED FOR FURTHER REFILLS) 200 strip 0  . fluticasone (FLONASE) 50 MCG/ACT nasal spray Place 1 spray into both nostrils daily. (Patient not taking: Reported on 09/10/2020) 16 g 2   No facility-administered medications prior to visit.    Allergies  Allergen Reactions  . Zestril [Lisinopril]     angioedema    ROS Review of Systems  Constitutional: Negative.   HENT: Negative.   Eyes: Negative for photophobia and visual disturbance.  Respiratory: Negative.   Cardiovascular: Negative.   Gastrointestinal: Negative.   Endocrine: Negative for polyphagia and polyuria.  Musculoskeletal: Negative for gait problem.  Skin: Negative for pallor and rash.  Neurological: Negative for facial asymmetry, speech difficulty and weakness.  Hematological: Does not bruise/bleed easily.  Psychiatric/Behavioral: Negative.       Objective:    Physical Exam Vitals and nursing note reviewed.  Constitutional:      General: He is not in acute distress.    Appearance: Normal appearance. He is not ill-appearing, toxic-appearing or diaphoretic.  HENT:     Head: Normocephalic and atraumatic.     Right Ear: There is impacted cerumen.     Left Ear: Tympanic  membrane, ear canal and external ear normal.     Mouth/Throat:     Mouth: Mucous membranes are moist.     Pharynx: Oropharynx is clear. No oropharyngeal exudate or posterior oropharyngeal erythema.  Cardiovascular:  Rate and Rhythm: Normal rate and regular rhythm.  Pulmonary:     Effort: Pulmonary effort is normal.     Breath sounds: Normal breath sounds.  Musculoskeletal:     Cervical back: No rigidity or tenderness.  Lymphadenopathy:     Cervical: No cervical adenopathy.  Skin:    General: Skin is warm and dry.     Findings: No rash.  Neurological:     Mental Status: He is alert and oriented to person, place, and time.  Psychiatric:        Mood and Affect: Mood normal.        Behavior: Behavior normal.    Subjective:    Darron Stuck Cradle is a 80 y.o. male whom I am asked to see for evaluation of diminished hearing in the right ear for the past 3 months. There is a prior history of cerumen impaction. The patient has not been using ear drops to loosen wax immediately prior to this visit. The patient denies ear pain.  The patient's history has been marked as reviewed and updated as appropriate.  Review of Systems Pertinent items are noted in HPI.    Objective:    Auditory canal(s) of the right ear are completely obstructed with cerumen.   Cerumen was removed using alligator forceps and gentle irrigation. Tympanic membranes are intact following the procedure.  Auditory canals are normal.    Assessment:    Cerumen Impaction without otitis externa.    Plan:    1. Care instructions given. 2. Home treatment: none 3. Follow-up as needed.  BP 132/70   Pulse 71   Temp 98 F (36.7 C) (Temporal)   Ht 5' 7"  (1.702 m)   Wt 225 lb 12.8 oz (102.4 kg)   SpO2 97%   BMI 35.37 kg/m  Wt Readings from Last 3 Encounters:  09/10/20 225 lb 12.8 oz (102.4 kg)  08/26/20 224 lb 9.6 oz (101.9 kg)  06/25/20 221 lb (100.2 kg)     Health Maintenance Due  Topic Date Due  .  OPHTHALMOLOGY EXAM  04/23/2020    There are no preventive care reminders to display for this patient.  Lab Results  Component Value Date   TSH 1.68 04/06/2018   Lab Results  Component Value Date   WBC 7.7 03/13/2020   HGB 14.4 03/13/2020   HCT 41.8 03/13/2020   MCV 93.2 03/13/2020   PLT 230.0 03/13/2020   Lab Results  Component Value Date   NA 137 03/13/2020   K 3.8 03/13/2020   CO2 26 03/13/2020   GLUCOSE 188 (H) 03/13/2020   BUN 24 (H) 03/13/2020   CREATININE 1.75 (H) 03/13/2020   BILITOT 0.5 03/13/2020   ALKPHOS 72 03/13/2020   AST 17 03/13/2020   ALT 20 03/13/2020   PROT 7.2 03/13/2020   ALBUMIN 4.8 03/13/2020   CALCIUM 9.9 03/13/2020   GFR 45.63 (L) 03/13/2020   Lab Results  Component Value Date   CHOL 139 03/13/2020   Lab Results  Component Value Date   HDL 40.40 03/13/2020   Lab Results  Component Value Date   LDLCALC 68 03/13/2020   Lab Results  Component Value Date   TRIG 156.0 (H) 03/13/2020   Lab Results  Component Value Date   CHOLHDL 3 03/13/2020   Lab Results  Component Value Date   HGBA1C 8.9 (A) 08/26/2020      Assessment & Plan:   Problem List Items Addressed This Visit      Cardiovascular and  Mediastinum   Essential hypertension - Primary   Relevant Orders   CBC   Comprehensive metabolic panel   Urinalysis, Routine w reflex microscopic     Nervous and Auditory   Impacted cerumen of right ear     Genitourinary   Stage 3b chronic kidney disease (HCC)   Relevant Orders   Comprehensive metabolic panel     Other   Dyslipidemia   Relevant Orders   Comprehensive metabolic panel   Lipid panel   Facial pain   Relevant Orders   Ambulatory referral to Neurology   History of prostate cancer   Relevant Orders   PSA      No orders of the defined types were placed in this encounter.   Follow-up: Return in about 6 months (around 03/13/2021), or if symptoms worsen or fail to improve.    Libby Maw, MD

## 2020-09-16 ENCOUNTER — Other Ambulatory Visit: Payer: Self-pay | Admitting: Family Medicine

## 2020-09-17 DIAGNOSIS — H5712 Ocular pain, left eye: Secondary | ICD-10-CM | POA: Diagnosis not present

## 2020-09-17 DIAGNOSIS — H401131 Primary open-angle glaucoma, bilateral, mild stage: Secondary | ICD-10-CM | POA: Diagnosis not present

## 2020-09-17 DIAGNOSIS — E119 Type 2 diabetes mellitus without complications: Secondary | ICD-10-CM | POA: Diagnosis not present

## 2020-09-18 DIAGNOSIS — H401131 Primary open-angle glaucoma, bilateral, mild stage: Secondary | ICD-10-CM | POA: Diagnosis not present

## 2020-10-21 DIAGNOSIS — H2513 Age-related nuclear cataract, bilateral: Secondary | ICD-10-CM | POA: Diagnosis not present

## 2020-10-21 DIAGNOSIS — H401131 Primary open-angle glaucoma, bilateral, mild stage: Secondary | ICD-10-CM | POA: Diagnosis not present

## 2020-10-21 DIAGNOSIS — N1832 Chronic kidney disease, stage 3b: Secondary | ICD-10-CM | POA: Diagnosis not present

## 2020-10-22 ENCOUNTER — Telehealth: Payer: Self-pay | Admitting: Family Medicine

## 2020-10-22 NOTE — Progress Notes (Signed)
  Chronic Care Management   Outreach Note  10/22/2020 Name: HAADI SANTELLAN MRN: [id number] DOB: [birth year]  Referred by: Libby Maw, MD Reason for referral : No chief complaint on file.   An unsuccessful telephone outreach was attempted today. The patient was referred to the pharmacist for assistance with care management and care coordination.   Follow Up Plan:   Carley Perdue [employer] Scheduler

## 2020-10-26 DIAGNOSIS — E559 Vitamin D deficiency, unspecified: Secondary | ICD-10-CM | POA: Diagnosis not present

## 2020-10-26 DIAGNOSIS — N281 Cyst of kidney, acquired: Secondary | ICD-10-CM | POA: Diagnosis not present

## 2020-10-26 DIAGNOSIS — E1122 Type 2 diabetes mellitus with diabetic chronic kidney disease: Secondary | ICD-10-CM | POA: Diagnosis not present

## 2020-10-26 DIAGNOSIS — N1832 Chronic kidney disease, stage 3b: Secondary | ICD-10-CM | POA: Diagnosis not present

## 2020-10-26 DIAGNOSIS — I129 Hypertensive chronic kidney disease with stage 1 through stage 4 chronic kidney disease, or unspecified chronic kidney disease: Secondary | ICD-10-CM | POA: Diagnosis not present

## 2020-10-26 DIAGNOSIS — E669 Obesity, unspecified: Secondary | ICD-10-CM | POA: Diagnosis not present

## 2020-10-28 ENCOUNTER — Other Ambulatory Visit: Payer: Self-pay

## 2020-10-28 ENCOUNTER — Ambulatory Visit: Payer: Medicare HMO | Admitting: Endocrinology

## 2020-10-28 VITALS — BP 156/90 | HR 68 | Ht 67.0 in | Wt 225.4 lb

## 2020-10-28 DIAGNOSIS — E119 Type 2 diabetes mellitus without complications: Secondary | ICD-10-CM

## 2020-10-28 LAB — POCT GLYCOSYLATED HEMOGLOBIN (HGB A1C): Hemoglobin A1C: 8.3 % — AB (ref 4.0–5.6)

## 2020-10-28 MED ORDER — OZEMPIC (0.25 OR 0.5 MG/DOSE) 2 MG/1.5ML ~~LOC~~ SOPN: 0.2500 mg | PEN_INJECTOR | SUBCUTANEOUS | 3 refills | Status: DC

## 2020-10-28 NOTE — Patient Instructions (Addendum)
Your blood pressure is high today.  Please see your primary care provider soon, to have it rechecked I have sent a prescription to your pharmacy, to add Ozempic.  I hope you get a good price on this.   Please continue the same insulins.   check your blood sugar twice a day.  vary the time of day when you check, between before the 3 meals, and at bedtime.  also check if you have symptoms of your blood sugar being too high or too low.  please keep a record of the readings and bring it to your next appointment here (or you can bring the meter itself).  You can write it on any piece of paper.  please call us sooner if your blood sugar goes below 70, or if you have a lot of readings over 200.    Please come back for a follow-up appointment in 2 months.

## 2020-10-28 NOTE — Progress Notes (Signed)
Subjective:    Patient ID: John Fry, male    DOB: 06/16/1941, 80 y.o.   MRN: [id number]  HPI Pt returns for f/u of diabetes mellitus:  DM type: 1 Dx'ed: 4008 Complications: CRI Therapy: insulin since dx DKA: only at dx.   Severe hypoglycemia: never.  Pancreatitis: never.   SDOH: he takes human insulin, due to cost.    Other: he takes multiple daily injections.   Interval history: pt says he never misses the insulin.  no cbg record, but states cbg's vary from 99-140.  pt states he feels well in general.  It is still in general lowest in the afternoon, with activity.   Past Medical History:  Diagnosis Date  . Diabetes mellitus without complication ([place])   . DYSLIPIDEMIA 03/05/2007  . Helicobacter pylori (H. pylori) 09/25/2013  . HERNIA 03/05/2007  . HYPERTENSION 03/05/2007  . 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 RENAL INSUFFICIENCY 05/22/2008  . S/P radiation therapy 04/14/2014 through 06/11/2014                                                      Prostate 7800 cGy in 40 sessions, seminal vesicles 5600 cGy in 40 sessions                        . SHOULDER PAIN, RIGHT 05/08/2008  . 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 FOOT SURGERY  1999   Right Foot Bunion Repair  . Lower Arterial  05/06/2003  . PROSTATE BIOPSY  07/23/13   gleason 7, volume 53 ml  . remove fatty tumor Left 1995   thigh    Social History   Socioeconomic History  . Marital status: Widowed    Spouse name: Not on file  . Number of children: Not on file  . Years of education: Not on file  . Highest education level: Not on file  Occupational History    Comment: Retired  Tobacco Use  . Smoking status: Former Smoker    Packs/day: 0.50    Years: 20.00    Pack years: 10.00    Quit date: 06/20/2006    Years since quitting: 14.3  . Smokeless tobacco: Never Used  Substance and Sexual Activity  . Alcohol use: No     Alcohol/week: 0.0 standard drinks  . Drug use: No  . Sexual activity: Not Currently  Other Topics Concern  . Not on file  Social History Narrative  . Not on file   Social Determinants of Health   Financial Resource Strain: Low Risk   . Difficulty of Paying Living Expenses: Not hard at all  Food Insecurity: No Food Insecurity  . Worried About Charity fundraiser in the Last Year: Never true  . Ran Out of Food in the Last Year: Never true  Transportation Needs: No Transportation Needs  . Lack of Transportation (Medical): No  . Lack of Transportation (Non-Medical): No  Physical Activity: Sufficiently Active  . Days of Exercise per Week: 7 days  . Minutes of Exercise per Session: 40 min  Stress: No Stress Concern Present  . Feeling of Stress : Not at all  Social Connections: Moderately Isolated  . Frequency of Communication with Friends and  Family: More than three times a week  . Frequency of Social Gatherings with Friends and Family: Once a week  . Attends Religious Services: 1 to 4 times per year  . Active Member of Clubs or Organizations: No  . Attends Archivist Meetings: Never  . Marital Status: Widowed  Intimate Partner Violence: Not At Risk  . Fear of Current or Ex-Partner: No  . Emotionally Abused: No  . Physically Abused: No  . Sexually Abused: No    Current Outpatient Medications on File Prior to Visit  Medication Sig Dispense Refill  . Alcohol Swabs (B-D SINGLE USE SWABS REGULAR) PADS USE  DAILY 200 each 0  . amLODipine (NORVASC) 10 MG tablet TAKE 1 TABLET EVERY DAY 90 tablet 1  . atorvastatin (LIPITOR) 40 MG tablet TAKE 1 TABLET EVERY DAY 90 tablet 3  . Blood Glucose Monitoring Suppl (TRUE METRIX METER) w/Device KIT USE AS DIRECTED 1 kit 0  . fluticasone (FLONASE) 50 MCG/ACT nasal spray Place 1 spray into both nostrils daily. 16 g 2  . hydrALAZINE (APRESOLINE) 50 MG tablet TAKE 1 TABLET THREE TIMES DAILY 270 tablet 1  . hydrochlorothiazide (HYDRODIURIL)  25 MG tablet TAKE 1 TABLET EVERY DAY 90 tablet 0  . insulin NPH Human (NOVOLIN N) 100 UNIT/ML injection Inject 0.34 mLs (34 Units total) into the skin at bedtime. 20 mL 11  . insulin regular (HUMULIN R) 100 units/mL injection Inject 0.4 mLs (40 Units total) into the skin 2 (two) times daily before a meal. 80 mL 11  . sildenafil (VIAGRA) 100 MG tablet Take 100 mg by mouth daily as needed.     . TRUE METRIX BLOOD GLUCOSE TEST test strip CHECK BLOOD SUGAR TWICE DAILY (APPOINTMENT IS NEEDED FOR FURTHER REFILLS) 200 strip 0   No current facility-administered medications on file prior to visit.    Allergies  Allergen Reactions  . Zestril [Lisinopril]     angioedema    Family History  Problem Relation Age of Onset  . Cancer Father 85       prostate  . Kidney Stones Brother   . Diabetes Sister   . Hypertension Neg Hx   . Colon cancer Neg Hx     BP (!) 156/90 (BP Location: Right Arm, Patient Position: Sitting, Cuff Size: Large)   Pulse 68   Ht 5' 7"  (1.702 m)   Wt 225 lb 6.4 oz (102.2 kg)   SpO2 98%   BMI 35.30 kg/m    Review of Systems     Objective:   Physical Exam VITAL SIGNS:  See vs page GENERAL: no distress Pulses: dorsalis pedis intact bilat.   MSK: no deformity of the feet, except for several overlapping toes on the right foot CV: no leg edema Skin:  no ulcer on the feet.  normal color and temp on the feet.  Old healed surgical scar over the right foot bunion area.  Neuro: sensation is intact to touch on the feet Ext: there is bilateral onychomycosis of the toenails.    Lab Results  Component Value Date   HGBA1C 8.3 (A) 10/28/2020       Assessment & Plan:  Type 1 DM: uncontrolled.  I hope pharmacy will cover, despite dx of type 1 DM.    Patient Instructions  Your blood pressure is high today.  Please see your primary care provider soon, to have it rechecked I have sent a prescription to your pharmacy, to add Ozempic.  I hope you get a  good price on this.    Please continue the same insulins.   check your blood sugar twice a day.  vary the time of day when you check, between before the 3 meals, and at bedtime.  also check if you have symptoms of your blood sugar being too high or too low.  please keep a record of the readings and bring it to your next appointment here (or you can bring the meter itself).  You can write it on any piece of paper.  please call us sooner if your blood sugar goes below 70, or if you have a lot of readings over 200.    Please come back for a follow-up appointment in 2 months.

## 2020-11-03 ENCOUNTER — Other Ambulatory Visit: Payer: Self-pay | Admitting: Endocrinology

## 2020-11-25 ENCOUNTER — Other Ambulatory Visit: Payer: Self-pay | Admitting: Family Medicine

## 2020-11-25 ENCOUNTER — Ambulatory Visit (INDEPENDENT_AMBULATORY_CARE_PROVIDER_SITE_OTHER): Payer: Medicare HMO | Admitting: Diagnostic Neuroimaging

## 2020-11-25 ENCOUNTER — Encounter: Payer: Self-pay | Admitting: Diagnostic Neuroimaging

## 2020-11-25 VITALS — BP 157/82 | HR 74 | Ht 67.0 in | Wt 226.0 lb

## 2020-11-25 DIAGNOSIS — G44059 Short lasting unilateral neuralgiform headache with conjunctival injection and tearing (SUNCT), not intractable: Secondary | ICD-10-CM | POA: Diagnosis not present

## 2020-11-25 DIAGNOSIS — R519 Headache, unspecified: Secondary | ICD-10-CM

## 2020-11-25 MED ORDER — LAMOTRIGINE 25 MG PO TABS: ORAL_TABLET | ORAL | 3 refills | Status: DC

## 2020-11-25 NOTE — Patient Instructions (Addendum)
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

## 2020-11-25 NOTE — Progress Notes (Signed)
[facility]  PATIENT: John Fry DOB: Jan 17, 1941  REFERRING CLINICIAN: Libby Maw,* HISTORY FROM: patient  REASON FOR VISIT: new consult    HISTORICAL  CHIEF COMPLAINT:  Chief Complaint  Patient presents with  . Facial Pain    Rm 7 New Pt "pain around left eye and up to my forehead for almost a year; saw ENT -check up/scans negative"     HISTORY OF PRESENT ILLNESS:   80 year old male here for evaluation of left and facial pain.  Symptoms started 2021 with intermittent episodes of pain starting from the medial aspect of his left eye rating to his left maxillary region and left supraorbital region.  Symptoms last about 5 to 10 minutes at a time.  Sometimes his eye waters and he feels nasal congestion and develops redness in the eye.  Patient has been to ENT and had a CT maxillofacial scans which were unremarkable.  He has been treated.  With antibiotics which have not helped.  Symptoms have been almost on a daily basis or every other day.    REVIEW OF SYSTEMS: Full 14 system review of systems performed and negative with exception of: as per HPI.   ALLERGIES: Allergies  Allergen Reactions  . Zestril [Lisinopril]     angioedema    HOME MEDICATIONS: Outpatient Medications Prior to Visit  Medication Sig Dispense Refill  . Alcohol Swabs (B-D SINGLE USE SWABS REGULAR) PADS USE  DAILY 200 each 0  . amLODipine (NORVASC) 10 MG tablet TAKE 1 TABLET EVERY DAY 90 tablet 1  . atorvastatin (LIPITOR) 40 MG tablet TAKE 1 TABLET EVERY DAY 90 tablet 3  . Blood Glucose Monitoring Suppl (TRUE METRIX METER) w/Device KIT USE AS DIRECTED 1 kit 0  . fluticasone (FLONASE) 50 MCG/ACT nasal spray Place 1 spray into both nostrils daily. 16 g 2  . hydrALAZINE (APRESOLINE) 50 MG tablet TAKE 1 TABLET THREE TIMES DAILY 270 tablet 1  . hydrochlorothiazide (HYDRODIURIL) 25 MG tablet TAKE 1 TABLET EVERY DAY 90 tablet 0  . insulin NPH Human (NOVOLIN N) 100 UNIT/ML  injection Inject 0.34 mLs (34 Units total) into the skin at bedtime. 20 mL 11  . insulin regular (HUMULIN R) 100 units/mL injection Inject 0.4 mLs (40 Units total) into the skin 2 (two) times daily before a meal. 80 mL 11  . Semaglutide,0.25 or 0.5MG/DOS, (OZEMPIC, 0.25 OR 0.5 MG/DOSE,) 2 MG/1.5ML SOPN Inject 0.25 mg into the skin once a week. 3 mL 3  . sildenafil (VIAGRA) 100 MG tablet Take 100 mg by mouth daily as needed.     . TRUE METRIX BLOOD GLUCOSE TEST test strip CHECK BLOOD SUGAR TWICE DAILY (APPOINTMENT IS NEEDED FOR FURTHER REFILLS) 200 strip 0   No facility-administered medications prior to visit.    PAST MEDICAL HISTORY: Past Medical History:  Diagnosis Date  . Diabetes mellitus without complication ([place])   . DYSLIPIDEMIA 03/05/2007  . Helicobacter pylori (H. pylori) 09/25/2013  . HERNIA 03/05/2007  . HYPERTENSION 03/05/2007  . 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 RENAL INSUFFICIENCY 05/22/2008  . S/P radiation therapy 04/14/2014 through 06/11/2014                                                      Prostate 7800 cGy in 40 sessions, seminal  vesicles 5600 cGy in 40 sessions                        . SHOULDER PAIN, RIGHT 05/08/2008  . VENTRICULAR HYPERTROPHY, LEFT 05/08/2008    PAST SURGICAL HISTORY: Past Surgical History:  Procedure Laterality Date  . FOOT SURGERY  1999   Right Foot Bunion Repair  . Lower Arterial  05/06/2003  . PROSTATE BIOPSY  07/23/13   gleason 7, volume 53 ml  . remove fatty tumor Left 1995   thigh    FAMILY HISTORY: Family History  Problem Relation Age of Onset  . Cancer Father 1       prostate  . Kidney Stones Brother   . Diabetes Sister   . Hypertension Neg Hx   . Colon cancer Neg Hx     SOCIAL HISTORY: Social History   Socioeconomic History  . Marital status: Widowed    Spouse name: Not on file  . Number of children: 2  . Years of education: Not on file  . Highest education level: Not on file  Occupational  History    Comment: Retired  Tobacco Use  . Smoking status: Former Smoker    Packs/day: 0.50    Years: 20.00    Pack years: 10.00    Quit date: 06/20/2006    Years since quitting: 14.4  . Smokeless tobacco: Never Used  Substance and Sexual Activity  . Alcohol use: No    Alcohol/week: 0.0 standard drinks    Comment: quit 17 years ago  . Drug use: No  . Sexual activity: Not Currently  Other Topics Concern  . Not on file  Social History Narrative   Lives alone   Social Determinants of Health   Financial Resource Strain: Low Risk   . Difficulty of Paying Living Expenses: Not hard at all  Food Insecurity: No Food Insecurity  . Worried About Charity fundraiser in the Last Year: Never true  . Ran Out of Food in the Last Year: Never true  Transportation Needs: No Transportation Needs  . Lack of Transportation (Medical): No  . Lack of Transportation (Non-Medical): No  Physical Activity: Sufficiently Active  . Days of Exercise per Week: 7 days  . Minutes of Exercise per Session: 40 min  Stress: No Stress Concern Present  . Feeling of Stress : Not at all  Social Connections: Moderately Isolated  . Frequency of Communication with Friends and Family: More than three times a week  . Frequency of Social Gatherings with Friends and Family: Once a week  . Attends Religious Services: 1 to 4 times per year  . Active Member of Clubs or Organizations: No  . Attends Archivist Meetings: Never  . Marital Status: Widowed  Intimate Partner Violence: Not At Risk  . Fear of Current or Ex-Partner: No  . Emotionally Abused: No  . Physically Abused: No  . Sexually Abused: No     PHYSICAL EXAM  GENERAL EXAM/CONSTITUTIONAL: Vitals:  Vitals:   11/25/20 0947  BP: (!) 157/82  Pulse: 74  Weight: 226 lb (102.5 kg)  Height: 5' 7"  (1.702 m)     Body mass index is 35.4 kg/m. Wt Readings from Last 3 Encounters:  11/25/20 226 lb (102.5 kg)  10/28/20 225 lb 6.4 oz (102.2 kg)   09/10/20 225 lb 12.8 oz (102.4 kg)     Patient is in no distress; well developed, nourished and groomed; neck is supple  CARDIOVASCULAR:  Examination of carotid arteries is normal; no carotid bruits  Regular rate and rhythm, no murmurs  Examination of peripheral vascular system by observation and palpation is normal  EYES:  Ophthalmoscopic exam of optic discs and posterior segments is normal; no papilledema or hemorrhages  No exam data present  MUSCULOSKELETAL:  Gait, strength, tone, movements noted in Neurologic exam below  NEUROLOGIC: MENTAL STATUS:  No flowsheet data found.  awake, alert, oriented to person, place and time  recent and remote memory intact  normal attention and concentration  language fluent, comprehension intact, naming intact  fund of knowledge appropriate  CRANIAL NERVE:   2nd - no papilledema on fundoscopic exam  2nd, 3rd, 4th, 6th - pupils equal and reactive to light, visual fields full to confrontation, extraocular muscles intact, no nystagmus  5th - facial sensation symmetric  7th - facial strength symmetric  8th - hearing intact  9th - palate elevates symmetrically, uvula midline  11th - shoulder shrug symmetric  12th - tongue protrusion midline  MOTOR:   normal bulk and tone, full strength in the BUE, BLE  SENSORY:   normal and symmetric to light touch, temperature, vibration  COORDINATION:   finger-nose-finger, fine finger movements normal  REFLEXES:   deep tendon reflexes present and symmetric  GAIT/STATION:   narrow based gait     DIAGNOSTIC DATA (LABS, IMAGING, TESTING) - I reviewed patient records, labs, notes, testing and imaging myself where available.  Lab Results  Component Value Date   WBC 7.8 09/10/2020   HGB 14.6 09/10/2020   HCT 42.6 09/10/2020   MCV 93.0 09/10/2020   PLT 231.0 09/10/2020      Component Value Date/Time   NA 139 09/10/2020 1123   K 4.1 09/10/2020 1123   CL 103  09/10/2020 1123   CO2 26 09/10/2020 1123   GLUCOSE 201 (H) 09/10/2020 1123   BUN 20 09/10/2020 1123   CREATININE 1.74 (H) 09/10/2020 1123   CALCIUM 10.1 09/10/2020 1123   CALCIUM 10.0 10/28/2011 1124   PROT 7.3 09/10/2020 1123   ALBUMIN 4.9 09/10/2020 1123   AST 20 09/10/2020 1123   ALT 22 09/10/2020 1123   ALKPHOS 67 09/10/2020 1123   BILITOT 0.6 09/10/2020 1123   GFRNONAA 40 (L) 09/27/2013 0835   GFRAA 46 (L) 09/27/2013 0835   Lab Results  Component Value Date   CHOL 156 09/10/2020   HDL 43.60 09/10/2020   LDLCALC 78 09/10/2020   LDLDIRECT 121.7 04/03/2013   TRIG 171.0 (H) 09/10/2020   CHOLHDL 4 09/10/2020   Lab Results  Component Value Date   HGBA1C 8.3 (A) 10/28/2020   No results found for: VITAMINB12 Lab Results  Component Value Date   TSH 1.68 04/06/2018        ASSESSMENT AND PLAN  80 y.o. year old male here with intermittent left facial pain with signs and symptoms consistent with SUNCT, since 2021.  Will check MRI of the brain to rule out other secondary cause.  Will try empiric trial of lamotrigine.   Dx:  1. Left facial pain   2. SUNCT (short unilateral neuralgiform headache, conjunctival inj/tear)      PLAN:   SUNCT (Short-lasting unilateral neuralgiform headache attacks with conjunctival injection and tearing) vs paroxysmal hemicrania vs trigeminal neuralgia - trial of lamotrigine - check MRI brain (rule out secondary causes)  Orders Placed This Encounter  Procedures  . MR BRAIN W WO CONTRAST   Meds ordered this encounter  Medications  . lamoTRIgine (LAMICTAL) 25 MG  tablet    Sig: Take 35m daily for 2 weeks; then take 212mtwice a day for 2 weeks; then take 5021mwice a day    Dispense:  240 tablet    Refill:  3   Return in about 4 months (around 03/27/2021).    VIKPenni BombardD 6/82/4/40100:27:25 Certified in Neurology, Neurophysiology and Neuroimaging  [facility] [id number]ui[place]iscoe[place]obson[place] [phone]

## 2020-12-10 ENCOUNTER — Ambulatory Visit
Admission: RE | Admit: 2020-12-10 | Discharge: 2020-12-10 | Disposition: A | Discharge status: Home / Self Care | Payer: Medicare HMO | Source: Ambulatory Visit | Attending: Diagnostic Neuroimaging | Admitting: Diagnostic Neuroimaging

## 2020-12-10 ENCOUNTER — Other Ambulatory Visit: Payer: Self-pay

## 2020-12-10 DIAGNOSIS — R519 Headache, unspecified: Secondary | ICD-10-CM | POA: Diagnosis not present

## 2020-12-10 MED ORDER — GADOBENATE DIMEGLUMINE 529 MG/ML IV SOLN
20.0000 mL | Freq: Once | INTRAVENOUS | Status: AC | PRN
  Administered 2020-12-10: 20 mL via INTRAVENOUS

## 2020-12-28 ENCOUNTER — Other Ambulatory Visit: Payer: Self-pay | Admitting: Family

## 2020-12-31 ENCOUNTER — Telehealth: Payer: Self-pay

## 2020-12-31 NOTE — Telephone Encounter (Signed)
Returned patients call to see how long he's been taking Hydrochlorothiazide 3 times daily. Unable to leave message will call back.

## 2020-12-31 NOTE — Telephone Encounter (Signed)
Patient had refill in June 2022 with 90 day supply. Called patient to inform unable to get through phone line busy signal. Will try again later.

## 2020-12-31 NOTE — Telephone Encounter (Signed)
Pt has been taking 3 pills a day. He would like a refill.

## 2020-12-31 NOTE — Telephone Encounter (Signed)
Pt needs a refill for HTZ Send to [place]  Thank you

## 2021-01-01 ENCOUNTER — Other Ambulatory Visit: Payer: Self-pay

## 2021-01-01 DIAGNOSIS — I10 Essential (primary) hypertension: Secondary | ICD-10-CM

## 2021-01-01 MED ORDER — HYDRALAZINE HCL 50 MG PO TABS: 50.0000 mg | ORAL_TABLET | Freq: Three times a day (TID) | ORAL | 1 refills | Status: DC

## 2021-01-01 NOTE — Telephone Encounter (Signed)
Called patient back no answer unable to LM

## 2021-01-01 NOTE — Telephone Encounter (Signed)
Spoke with patient who states that it was the Hydralazine that he needs refill on. Rx sent in for refills. Patient aware.

## 2021-01-06 ENCOUNTER — Telehealth: Payer: Self-pay | Admitting: *Deleted

## 2021-01-06 ENCOUNTER — Ambulatory Visit: Payer: Medicare HMO | Admitting: Endocrinology

## 2021-01-06 NOTE — Telephone Encounter (Signed)
Spoke with patient and informed him the MRI brain is ok overall. 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 Dr Leta Baptist stated it also could have been there for a long time. It's not urgent to treat. Recommend to repeat MRI brain with pituitary protocol in 3 months. Answered questions to his stated satisfaction. He said that he is now taking lamotrigine 50 mg twice daily but pain has not improved much. I advised will let Dr Leta Baptist know. Patient verbalized understanding, appreciation.'

## 2021-01-27 ENCOUNTER — Other Ambulatory Visit: Payer: Self-pay | Admitting: Family Medicine

## 2021-02-09 ENCOUNTER — Telehealth: Payer: Self-pay | Admitting: *Deleted

## 2021-02-09 NOTE — Chronic Care Management (AMB) (Signed)
  Chronic Care Management   Note  02/09/2021 Name: John Fry MRN: [id number] DOB: 1940-11-11  John Fry is a 80 y.o. year old male who is a primary care patient of Britney, Captain, MD. I reached out to Remer Macho by phone today in response to a referral sent by John Fry's PCP Libby Maw, MD     John Fry was given information about Chronic Care Management services today including:  CCM service includes personalized support from designated clinical staff supervised by his physician, including individualized plan of care and coordination with other care providers 24/7 contact phone numbers for assistance for urgent and routine care needs. Service will only be billed when office clinical staff spend 20 minutes or more in a month to coordinate care. Only one practitioner may furnish and bill the service in a calendar month. The patient may stop CCM services at any time (effective at the end of the month) by phone call to the office staff. The patient will be responsible for cost sharing (co-pay) of up to 20% of the service fee (after annual deductible is met).  Patient agreed to services and verbal consent obtained.   Follow up plan: Telephone appointment with care management team member scheduled for: 02/24/2021  Julian Hy, [place], [place] Management  Direct Dial: [phone]

## 2021-02-24 ENCOUNTER — Telehealth: Payer: Medicare HMO

## 2021-02-24 ENCOUNTER — Telehealth: Payer: Self-pay

## 2021-02-24 ENCOUNTER — Telehealth: Payer: Self-pay | Admitting: *Deleted

## 2021-02-24 NOTE — Telephone Encounter (Signed)
  Care Management   Follow Up Note   02/24/2021 Name: BOOKERT GUZZI MRN: [id number] DOB: 1941-04-06   Referred by: Libby Maw, MD Reason for referral : Chronic Care Management (Initial assessment)   An unsuccessful telephone outreach was attempted today. The patient was referred to the case management team for assistance with care management and care coordination.   Follow Up Plan: A HIPPA compliant phone message was left for the patient providing contact information and requesting a return call.   Quinn Plowman RN,BSN,CCM RN Case Manager [place] [phone]

## 2021-02-24 NOTE — Chronic Care Management (AMB) (Signed)
  Care Management   Note  02/24/2021 Name: John Fry MRN: [id number] DOB: June 14, 1941  Lendon Ka Stander is a 80 y.o. year old male who is a primary care patient of Uzziel, Russey, MD and is actively engaged with the care management team. I reached out to Remer Macho by phone today to assist with re-scheduling an initial visit with the RN Case Manager  Follow up plan: Unsuccessful telephone outreach attempt made. A HIPAA compliant phone message was left for the patient providing contact information and requesting a return call.   Julian Hy, [place] Management  Direct Dial: [phone]

## 2021-03-03 NOTE — Chronic Care Management (AMB) (Signed)
  Care Management   Note  03/03/2021 Name: JAHSEH LUCCHESE MRN: [id number] DOB: 1940/08/09  Lendon Ka Hoxworth is a 80 y.o. year old male who is a primary care patient of Lyell, [place]ug[place], MD and is actively engaged with the care management team. I reached out to Remer Macho by phone today to assist with re-scheduling an initial visit with the RN Case Manager  Follow up plan: Telephone appointment with care management team member scheduled for: 03/10/2021  Julian Hy, [place], [place] Management  Direct Dial: [phone]

## 2021-03-10 ENCOUNTER — Telehealth: Payer: Medicare HMO

## 2021-03-10 ENCOUNTER — Telehealth: Payer: Self-pay

## 2021-03-10 NOTE — Telephone Encounter (Signed)
  Care Management   Follow Up Note   03/10/2021 Name: John Fry MRN: [id number] DOB: 1941-05-20   Referred by: Libby Maw, MD Reason for referral : Chronic Care Management (Initial assessment)   A second unsuccessful telephone outreach was attempted today. The patient was referred to the case management team for assistance with care management and care coordination.   Follow Up Plan: A HIPPA compliant phone message was left for the patient providing contact information and requesting a return call.   Quinn Plowman RN,BSN,CCM RN Case Manager [place]  [phone]

## 2021-03-15 ENCOUNTER — Ambulatory Visit: Payer: Medicare HMO | Admitting: Family Medicine

## 2021-03-24 ENCOUNTER — Encounter: Payer: Self-pay | Admitting: Diagnostic Neuroimaging

## 2021-03-24 ENCOUNTER — Ambulatory Visit: Payer: Medicare HMO | Admitting: Diagnostic Neuroimaging

## 2021-03-24 VITALS — BP 142/73 | HR 71 | Ht 67.0 in | Wt 221.8 lb

## 2021-03-24 DIAGNOSIS — G44059 Short lasting unilateral neuralgiform headache with conjunctival injection and tearing (SUNCT), not intractable: Secondary | ICD-10-CM | POA: Diagnosis not present

## 2021-03-24 DIAGNOSIS — R519 Headache, unspecified: Secondary | ICD-10-CM

## 2021-03-24 DIAGNOSIS — E236 Other disorders of pituitary gland: Secondary | ICD-10-CM | POA: Diagnosis not present

## 2021-03-24 MED ORDER — LAMOTRIGINE 25 MG PO TABS: ORAL_TABLET | ORAL | 3 refills | Status: DC

## 2021-03-24 NOTE — Progress Notes (Signed)
[facility]  PATIENT: John Fry DOB: 1940-11-01  REFERRING CLINICIAN: Libby Maw,* HISTORY FROM: patient  REASON FOR VISIT: new consult    HISTORICAL  CHIEF COMPLAINT:  Chief Complaint  Patient presents with   Left Facial Pain    Rm 7, 3 month FU  "my face hurts every once in a while, but is better;  starts on left side of nose, pressure makes it quit, hurts basically when I eat"     HISTORY OF PRESENT ILLNESS:   UPDATE (03/24/2021, VRP): Since last visit, doing BETTER.  Lamotrigine seems to be helping symptoms.  Sometimes when pain starts he places pressure on his left maxillary region and nose and this seems to symptoms.    PRIOR HPI (11/25/20, VRP): 80 year old male here for evaluation of left and facial pain.  Symptoms started 2021 with intermittent episodes of pain starting from the medial aspect of his left eye rating to his left maxillary region and left supraorbital region.  Symptoms last about 5 to 10 minutes at a time.  Sometimes his eye waters and he feels nasal congestion and develops redness in the eye.  Patient has been to ENT and had a CT maxillofacial scans which were unremarkable.  He has been treated.  With antibiotics which have not helped.  Symptoms have been almost on a daily basis or every other day.    REVIEW OF SYSTEMS: Full 14 system review of systems performed and negative with exception of: as per HPI.   ALLERGIES: Allergies  Allergen Reactions   Zestril [Lisinopril]     angioedema    HOME MEDICATIONS: Outpatient Medications Prior to Visit  Medication Sig Dispense Refill   Alcohol Swabs (B-D SINGLE USE SWABS REGULAR) PADS USE  DAILY 200 each 0   amLODipine (NORVASC) 10 MG tablet TAKE 1 TABLET EVERY DAY 90 tablet 1   atorvastatin (LIPITOR) 40 MG tablet TAKE 1 TABLET EVERY DAY 90 tablet 3   Blood Glucose Monitoring Suppl (TRUE METRIX METER) w/Device KIT USE AS DIRECTED 1 kit 0   fluticasone (FLONASE) 50  MCG/ACT nasal spray Place 1 spray into both nostrils daily. 16 g 2   hydrALAZINE (APRESOLINE) 50 MG tablet Take 1 tablet (50 mg total) by mouth 3 (three) times daily. 270 tablet 1   hydrochlorothiazide (HYDRODIURIL) 25 MG tablet TAKE 1 TABLET EVERY DAY 90 tablet 0   insulin NPH Human (NOVOLIN N) 100 UNIT/ML injection Inject 0.34 mLs (34 Units total) into the skin at bedtime. 20 mL 11   insulin regular (HUMULIN R) 100 units/mL injection Inject 0.4 mLs (40 Units total) into the skin 2 (two) times daily before a meal. 80 mL 11   Semaglutide,0.25 or 0.5MG/DOS, (OZEMPIC, 0.25 OR 0.5 MG/DOSE,) 2 MG/1.5ML SOPN Inject 0.25 mg into the skin once a week. 3 mL 3   sildenafil (VIAGRA) 100 MG tablet Take 100 mg by mouth daily as needed.      TRUE METRIX BLOOD GLUCOSE TEST test strip CHECK BLOOD SUGAR TWICE DAILY (APPOINTMENT IS NEEDED FOR FURTHER REFILLS) 200 strip 0   lamoTRIgine (LAMICTAL) 25 MG tablet Take 10m daily for 2 weeks; then take 250mtwice a day for 2 weeks; then take 5044mwice a day 240 tablet 3   No facility-administered medications prior to visit.    PAST MEDICAL HISTORY: Past Medical History:  Diagnosis Date   Diabetes mellitus without complication ([facility]  DYSLIPIDEMIA 03/07/11/4580Helicobacter pylori (H. pylori) 09/25/2013   HERNIA  03/05/2007   HYPERTENSION 03/05/2007   Prostate cancer ([place]) 07/23/13   Gleason 7, volume 53 ml   RENAL INSUFFICIENCY 05/22/2008   S/P radiation therapy 04/14/2014 through 06/11/2014                                                      Prostate 7800 cGy in 40 sessions, seminal vesicles 5600 cGy in 40 sessions                         SHOULDER PAIN, RIGHT 05/08/2008   VENTRICULAR HYPERTROPHY, LEFT 05/08/2008    PAST SURGICAL HISTORY: Past Surgical History:  Procedure Laterality Date   FOOT SURGERY  1999   Right Foot Bunion Repair   Lower Arterial  05/06/2003   PROSTATE BIOPSY  07/23/13   gleason 7, volume 53 ml   remove fatty tumor Left 1995   thigh     FAMILY HISTORY: Family History  Problem Relation Age of Onset   Cancer Father 23       prostate   Kidney Stones Brother    Diabetes Sister    Hypertension Neg Hx    Colon cancer Neg Hx     SOCIAL HISTORY: Social History   Socioeconomic History   Marital status: Widowed    Spouse name: Not on file   Number of children: 2   Years of education: Not on file   Highest education level: Not on file  Occupational History    Comment: Retired  Tobacco Use   Smoking status: Former    Packs/day: 0.50    Years: 20.00    Pack years: 10.00    Types: Cigarettes    Quit date: 06/20/2006    Years since quitting: 14.7   Smokeless tobacco: Never  Substance and Sexual Activity   Alcohol use: No    Alcohol/week: 0.0 standard drinks    Comment: quit 17 years ago   Drug use: No   Sexual activity: Not Currently  Other Topics Concern   Not on file  Social History Narrative   Lives alone   Social Determinants of Health   Financial Resource Strain: Low Risk    Difficulty of Paying Living Expenses: Not hard at all  Food Insecurity: No Food Insecurity   Worried About Charity fundraiser in the Last Year: Never true   [place] in the Last Year: Never true  Transportation Needs: No Transportation Needs   Lack of Transportation (Medical): No   Lack of Transportation (Non-Medical): No  Physical Activity: Sufficiently Active   Days of Exercise per Week: 7 days   Minutes of Exercise per Session: 40 min  Stress: No Stress Concern Present   Feeling of Stress : Not at all  Social Connections: Moderately Isolated   Frequency of Communication with Friends and Family: More than three times a week   Frequency of Social Gatherings with Friends and Family: Once a week   Attends Religious Services: 1 to 4 times per year   Active Member of [employer] or Organizations: No   Attends Archivist Meetings: Never   Marital Status: Widowed  Intimate Partner Violence: Not At Risk   Fear of  Current or Ex-Partner: No   Emotionally Abused: No   Physically Abused: No   Sexually Abused:  No     PHYSICAL EXAM  GENERAL EXAM/CONSTITUTIONAL: Vitals:  Vitals:   03/24/21 1028  BP: (!) 142/73  Pulse: 71  Weight: 221 lb 12.8 oz (100.6 kg)  Height: _0  (1.702 m)   Body mass index is 34.74 kg/m. Wt Readings from Last 3 Encounters:  03/24/21 221 lb 12.8 oz (100.6 kg)  11/25/20 226 lb (102.5 kg)  10/28/20 225 lb 6.4 oz (102.2 kg)   Patient is in no distress; well developed, nourished and groomed; neck is supple  CARDIOVASCULAR: Examination of carotid arteries is normal; no carotid bruits Regular rate and rhythm, no murmurs Examination of peripheral vascular system by observation and palpation is normal  EYES: Ophthalmoscopic exam of optic discs and posterior segments is normal; no papilledema or hemorrhages; CONJUNCTIVAL INJECTION No results found.  MUSCULOSKELETAL: Gait, strength, tone, movements noted in Neurologic exam below  NEUROLOGIC: MENTAL STATUS:  No flowsheet data found. awake, alert, oriented to person, place and time recent and remote memory intact normal attention and concentration language fluent, comprehension intact, naming intact fund of knowledge appropriate  CRANIAL NERVE:  2nd - no papilledema on fundoscopic exam 2nd, 3rd, 4th, 6th - pupils equal and reactive to light, visual fields full to confrontation, extraocular muscles intact, no nystagmus 5th - facial sensation symmetric 7th - facial strength symmetric 8th - hearing intact 9th - palate elevates symmetrically, uvula midline 11th - shoulder shrug symmetric 12th - tongue protrusion midline  MOTOR:  normal bulk and tone, full strength in the BUE, BLE  SENSORY:  normal and symmetric to light touch, temperature, vibration  COORDINATION:  finger-nose-finger, fine finger movements normal  REFLEXES:  deep tendon reflexes present and symmetric  GAIT/STATION:  narrow based  gait     DIAGNOSTIC DATA (LABS, IMAGING, TESTING) - I reviewed patient records, labs, notes, testing and imaging myself where available.  Lab Results  Component Value Date   WBC 7.8 09/10/2020   HGB 14.6 09/10/2020   HCT 42.6 09/10/2020   MCV 93.0 09/10/2020   PLT 231.0 09/10/2020      Component Value Date/Time   NA 139 09/10/2020 1123   K 4.1 09/10/2020 1123   CL 103 09/10/2020 1123   CO2 26 09/10/2020 1123   GLUCOSE 201 (H) 09/10/2020 1123   BUN 20 09/10/2020 1123   CREATININE 1.74 (H) 09/10/2020 1123   CALCIUM 10.1 09/10/2020 1123   CALCIUM 10.0 10/28/2011 1124   PROT 7.3 09/10/2020 1123   ALBUMIN 4.9 09/10/2020 1123   AST 20 09/10/2020 1123   ALT 22 09/10/2020 1123   ALKPHOS 67 09/10/2020 1123   BILITOT 0.6 09/10/2020 1123   GFRNONAA 40 (L) 09/27/2013 0835   GFRAA 46 (L) 09/27/2013 0835   Lab Results  Component Value Date   CHOL 156 09/10/2020   HDL 43.60 09/10/2020   LDLCALC 78 09/10/2020   LDLDIRECT 121.7 04/03/2013   TRIG 171.0 (H) 09/10/2020   CHOLHDL 4 09/10/2020   Lab Results  Component Value Date   HGBA1C 8.3 (A) 10/28/2020   No results found for: [id number] Lab Results  Component Value Date   TSH 1.68 04/06/2018    12/10/20 MRI brain  MRI of the brain with and without contrast shows the following: 1.   Although the left trigeminal nerve has normal signal, there are adjacent blood vessels near the dorsal root entry zone.  Unclear if this could contribute to facial pain. 2.   6 x 8 mm nonenhancing mass in the pituitary gland.  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 3.   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 4.   No acute findings.    ASSESSMENT AND PLAN  80 y.o. year old male here with intermittent left facial pain with signs and symptoms consistent with  SUNCT, since 2021.    Dx:  1. SUNCT (short unilateral neuralgiform headache, conjunctival inj/tear)   2. 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    VIKRPenni Bombard 10/593/09/[id number] in Neurology, Neurophysiology and Neuroimaging  [facility] [address]it[place]anna[place] [phone]

## 2021-03-24 NOTE — Patient Instructions (Signed)
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

## 2021-03-29 ENCOUNTER — Other Ambulatory Visit: Payer: Self-pay | Admitting: Endocrinology

## 2021-04-12 DIAGNOSIS — C61 Malignant neoplasm of prostate: Secondary | ICD-10-CM | POA: Diagnosis not present

## 2021-04-16 ENCOUNTER — Other Ambulatory Visit: Payer: Self-pay | Admitting: Family Medicine

## 2021-04-19 DIAGNOSIS — C61 Malignant neoplasm of prostate: Secondary | ICD-10-CM | POA: Diagnosis not present

## 2021-04-19 DIAGNOSIS — R350 Frequency of micturition: Secondary | ICD-10-CM | POA: Diagnosis not present

## 2021-04-19 DIAGNOSIS — N281 Cyst of kidney, acquired: Secondary | ICD-10-CM | POA: Diagnosis not present

## 2021-04-19 DIAGNOSIS — N5201 Erectile dysfunction due to arterial insufficiency: Secondary | ICD-10-CM | POA: Diagnosis not present

## 2021-04-19 DIAGNOSIS — N1832 Chronic kidney disease, stage 3b: Secondary | ICD-10-CM | POA: Diagnosis not present

## 2021-04-20 ENCOUNTER — Telehealth: Payer: Self-pay | Admitting: *Deleted

## 2021-04-20 ENCOUNTER — Ambulatory Visit: Payer: Medicare HMO

## 2021-04-20 NOTE — Telephone Encounter (Signed)
Unable to reach patient for AWV X3

## 2021-06-02 ENCOUNTER — Other Ambulatory Visit: Payer: Self-pay | Admitting: Family Medicine

## 2021-07-06 ENCOUNTER — Ambulatory Visit: Payer: Medicare HMO

## 2021-07-13 ENCOUNTER — Ambulatory Visit (INDEPENDENT_AMBULATORY_CARE_PROVIDER_SITE_OTHER): Payer: Medicare HMO

## 2021-07-13 DIAGNOSIS — Z0000 Encounter for general adult medical examination without abnormal findings: Secondary | ICD-10-CM | POA: Diagnosis not present

## 2021-07-13 NOTE — Progress Notes (Signed)
Subjective:   John Fry is a 81 y.o. male who presents for an Initial Medicare Annual Wellness Visit.  I connected with John Fry today by telephone and verified that I am speaking with the correct person using two identifiers. Location patient: home Location provider: work Persons participating in the virtual visit: patient, provider.   I discussed the limitations, risks, security and privacy concerns of performing an evaluation and management service by telephone and the availability of in person appointments. I also discussed with the patient that there may be a patient responsible charge related to this service. The patient expressed understanding and verbally consented to this telephonic visit.    Interactive audio and video telecommunications were attempted between this provider and patient, however failed, due to patient having technical difficulties OR patient did not have access to video capability.  We continued and completed visit with audio only.    Review of Systems     Cardiac Risk Factors include: advanced age (>83mn, >>68women);dyslipidemia;diabetes mellitus;male gender;hypertension     Objective:    Today's Vitals   There is no height or weight on file to calculate BMI.  Advanced Directives 07/13/2021 04/16/2020 04/06/2018 03/15/2017 09/30/2015 09/11/2014 07/10/2014  Does Patient Have a Medical Advance Directive? No Yes _0   Type of Advance Directive - [place] Chart? - No - copy requested - - - - -  Would patient like information on creating a medical advance directive? No - Patient declined - - - - No - patient declined information -    Current Medications (verified) Outpatient Encounter Medications as of 07/13/2021  Medication Sig   Alcohol Swabs (B-D SINGLE USE SWABS REGULAR) PADS USE  DAILY   amLODipine (NORVASC) 10 MG tablet TAKE 1 TABLET EVERY DAY    atorvastatin (LIPITOR) 40 MG tablet TAKE 1 TABLET EVERY DAY   Blood Glucose Monitoring Suppl (TRUE METRIX METER) w/Device KIT USE AS DIRECTED   hydrALAZINE (APRESOLINE) 50 MG tablet Take 1 tablet (50 mg total) by mouth 3 (three) times daily.   hydrochlorothiazide (HYDRODIURIL) 25 MG tablet TAKE 1 TABLET EVERY DAY   insulin NPH Human (NOVOLIN N) 100 UNIT/ML injection Inject 0.34 mLs (34 Units total) into the skin at bedtime.   insulin regular (HUMULIN R) 100 units/mL injection Inject 0.4 mLs (40 Units total) into the skin 2 (two) times daily before a meal.   Semaglutide,0.25 or 0.5MG/DOS, (OZEMPIC, 0.25 OR 0.5 MG/DOSE,) 2 MG/1.5ML SOPN Inject 0.25 mg into the skin once a week.   TRUE METRIX BLOOD GLUCOSE TEST test strip CHECK BLOOD SUGAR TWICE DAILY (APPOINTMENT IS NEEDED FOR FURTHER REFILLS)   fluticasone (FLONASE) 50 MCG/ACT nasal spray Place 1 spray into both nostrils daily. (Patient not taking: Reported on 07/13/2021)   lamoTRIgine (LAMICTAL) 25 MG tablet 744mtwice a day x 2 weeks; then 10054mwice a day (Patient not taking: Reported on 07/13/2021)   sildenafil (VIAGRA) 100 MG tablet Take 100 mg by mouth daily as needed.  (Patient not taking: Reported on 07/13/2021)   No facility-administered encounter medications on file as of 07/13/2021.    Allergies (verified) Zestril [lisinopril]   History: Past Medical History:  Diagnosis Date   Diabetes mellitus without complication ([place]  DYSLIPIDEMIA 03/04/95/7591Helicobacter pylori (H. 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   RENAL INSUFFICIENCY 05/22/2008   S/P radiation therapy 04/14/2014 through 06/11/2014                                                      Prostate 7800 cGy in 40 sessions, seminal vesicles 5600 cGy in 40 sessions                         SHOULDER PAIN, RIGHT 05/08/2008   VENTRICULAR HYPERTROPHY, LEFT 05/08/2008   Past Surgical History:   Procedure Laterality Date   FOOT SURGERY  1999   Right Foot Bunion Repair   Lower Arterial  05/06/2003   PROSTATE BIOPSY  07/23/13   gleason 7, volume 53 ml   remove fatty tumor Left 1995   thigh   Family History  Problem Relation Age of Onset   Cancer Father 42       prostate   Kidney Stones Brother    Diabetes Sister    Hypertension Neg Hx    Colon cancer Neg Hx    Social History   Socioeconomic History   Marital status: Widowed    Spouse name: Not on file   Number of children: 2   Years of education: Not on file   Highest education level: Not on file  Occupational History    Comment: Retired  Tobacco Use   Smoking status: Former    Packs/day: 0.50    Years: 20.00    Pack years: 10.00    Types: Cigarettes    Quit date: 06/20/2006    Years since quitting: 15.0   Smokeless tobacco: Never  Substance and Sexual Activity   Alcohol use: No    Alcohol/week: 0.0 standard drinks    Comment: quit 17 years ago   Drug use: No   Sexual activity: Not Currently  Other Topics Concern   Not on file  Social History Narrative   Lives alone   Social Determinants of Health   Financial Resource Strain: Low Risk    Difficulty of Paying Living Expenses: Not hard at all  Food Insecurity: No Food Insecurity   Worried About Charity fundraiser in the Last Year: Never true   [place] in the Last Year: Never true  Transportation Needs: No Transportation Needs   Lack of Transportation (Medical): No   Lack of Transportation (Non-Medical): No  Physical Activity: Sufficiently Active   Days of Exercise per Week: 5 days   Minutes of Exercise per Session: 60 min  Stress: No Stress Concern Present   Feeling of Stress : Not at all  Social Connections: Moderately Isolated   Frequency of Communication with Friends and Family: Three times a week   Frequency of Social Gatherings with Friends and Family: Three times a week   Attends Religious Services: More than 4 times per year    Active Member of Clubs or Organizations: No   Attends Archivist Meetings: Never   Marital Status: Widowed    Tobacco Counseling Counseling given: Not Answered   Clinical Intake:  Pre-visit preparation completed: Yes  Pain : No/denies pain     Nutritional Risks: None Diabetes: Yes CBG done?: No Did pt. bring in CBG monitor from home?: No  How often do you need to have someone help you  when you read instructions, pamphlets, or other written materials from your doctor or pharmacy?: 1 - Never What is the last grade level you completed in school?: 12th grade  Diabetic?yes Nutrition Risk Assessment:  Has the patient had any N/V/D within the last 2 months?  No  Does the patient have any non-healing wounds?  No  Has the patient had any unintentional weight loss or weight gain?  No   Diabetes:  Is the patient diabetic?  Yes  If diabetic, was a CBG obtained today?  No  Did the patient bring in their glucometer from home?  No  How often do you monitor your CBG's? 2x day .   Financial Strains and Diabetes Management:  Are you having any financial strains with the device, your supplies or your medication? No .  Does the patient want to be seen by Chronic Care Management for management of their diabetes?  No  Would the patient like to be referred to a Nutritionist or for Diabetic Management?  No   Diabetic Exams:  Diabetic Eye Exam: Completed 03/2021 Diabetic Foot Exam: Overdue, Pt has been advised about the importance in completing this exam. Pt is scheduled for diabetic foot exam on next office visit .   Interpreter Needed?: No  Information entered by :: [place] of Daily Living In your present state of health, do you have any difficulty performing the following activities: 07/13/2021  Hearing? N  Vision? N  Difficulty concentrating or making decisions? N  Walking or climbing stairs? N  Dressing or bathing? N  Doing errands, shopping? N   Preparing Food and eating ? N  Using the Toilet? N  In the past six months, have you accidently leaked urine? N  Do you have problems with loss of bowel control? N  Managing your Medications? N  Managing your Finances? N  Housekeeping or managing your Housekeeping? N  Some recent data might be hidden    Patient Care Team: Libby Maw, MD as PCP - General (Family Medicine) Arloa Koh, MD (Inactive) as Consulting Physician (Radiation Oncology) Marylynn Pearson, MD as Consulting Physician (Ophthalmology) Dannielle Karvonen, RN as [place] any recent Medical Services you may have received from other than [facility] providers in the past year (date may be approximate).     Assessment:   This is a routine wellness examination for John Fry.  Hearing/Vision screen Vision Screening - Comments:: Annual eye exams   Dietary issues and exercise activities discussed: Current Exercise Habits: Home exercise routine, Type of exercise: walking, Time (Minutes): 50, Frequency (Times/Week): 5, Weekly Exercise (Minutes/Week): 250, Intensity: Mild, Exercise limited by: None identified   Goals Addressed             This Visit's Progress    Patient Stated   On track    Maintain current health & current level of activity       Depression Screen PHQ 2/9 Scores 07/13/2021 07/13/2021 09/10/2020 04/16/2020 10/02/2017  PHQ - 2 Score 0 0 0 0 0    Fall Risk Fall Risk  07/13/2021 09/10/2020 04/16/2020 09/11/2019 10/02/2017  Falls in the past year? 0 0 0 0 No  Number falls in past yr: 0 - 0 - -  Injury with Fall? 0 - 0 - -  Risk for fall due to : No Fall Risks - - - -  Follow up Falls evaluation completed - Falls prevention discussed - -    FALL RISK PREVENTION  PERTAINING TO THE HOME:  Any stairs in or around the home? No  If so, are there any without handrails? No  Home free of loose throw rugs in walkways, pet beds, electrical cords, etc? Yes  Adequate  lighting in your home to reduce risk of falls? Yes   ASSISTIVE DEVICES UTILIZED TO PREVENT FALLS:  Life alert? No  Use of a cane, walker or w/c? No  Grab bars in the bathroom? No  Shower chair or bench in shower? No  Elevated toilet seat or a handicapped toilet? No   Cognitive Function:  Normal cognitive status assessed by direct observation by this Nurse Health Advisor. No abnormalities found.        Immunizations Immunization History  Administered Date(s) Administered   Fluad Quad(high Dose 65+) 03/08/2019, 03/25/2020   Influenza Whole 05/08/2008   Influenza, High Dose Seasonal PF 03/15/2017, 04/06/2018   Influenza,inj,Quad PF,6+ Mos 05/01/2015   Moderna Sars-Covid-2 Vaccination 08/28/2019, 09/27/2019, 05/04/2020   Pneumococcal Conjugate-13 07/10/2014   Pneumococcal Polysaccharide-23 05/08/2008   Tdap 07/10/2014    TDAP status: Up to date  Flu Vaccine status: Up to date  Pneumococcal vaccine status: Up to date  Covid-19 vaccine status: Completed vaccines  Qualifies for Shingles Vaccine? Yes   Zostavax completed No   Shingrix Completed?: No.    Education has been provided regarding the importance of this vaccine. Patient has been advised to call insurance company to determine out of pocket expense if they have not yet received this vaccine. Advised may also receive vaccine at local pharmacy or Health Dept. Verbalized acceptance and understanding.  Screening Tests Health Maintenance  Topic Date Due   Zoster Vaccines- Shingrix (1 of 2) Never done   OPHTHALMOLOGY EXAM  04/23/2020   COVID-19 Vaccine (4 - Booster for Moderna series) 06/29/2020   INFLUENZA VACCINE  01/18/2021   HEMOGLOBIN A1C  04/30/2021   FOOT EXAM  10/28/2021   TETANUS/TDAP  07/10/2024   Pneumonia Vaccine 56+ Years old  Completed   HPV VACCINES  Aged Out   COLONOSCOPY (Pts 45-74yrs Insurance coverage will need to be confirmed)  Discontinued    Health Maintenance  Health Maintenance Due  Topic  Date Due   Zoster Vaccines- Shingrix (1 of 2) Never done   OPHTHALMOLOGY EXAM  04/23/2020   COVID-19 Vaccine (4 - Booster for Moderna series) 06/29/2020   INFLUENZA VACCINE  01/18/2021   HEMOGLOBIN A1C  04/30/2021    Colorectal cancer screening: No longer required.   Lung Cancer Screening: (Low Dose CT Chest recommended if Age 64-80 years, 30 pack-year currently smoking OR have quit w/in 15years.) does not qualify.   Lung Cancer Screening Referral: n/a  Additional Screening:  Hepatitis C Screening: does not qualify;   Vision Screening: Recommended annual ophthalmology exams for early detection of glaucoma and other disorders of the eye. Is the patient up to date with their annual eye exam?  Yes  Who is the provider or what is the name of the office in which the patient attends annual eye exams? [email or web address]  If pt is not established with a provider, would they like to be referred to a provider to establish care? No .   Dental Screening: Recommended annual dental exams for proper oral hygiene  Community Resource Referral / Chronic Care Management: CRR required this visit?  No   CCM required this visit?  No      Plan:     I have personally reviewed and noted the following in the patients chart:  Medical and social history Use of alcohol, tobacco or illicit drugs  Current medications and supplements including opioid prescriptions. Patient is not currently taking opioid prescriptions. Functional ability and status Nutritional status Physical activity Advanced directives List of other physicians Hospitalizations, surgeries, and ER visits in previous 12 months Vitals Screenings to include cognitive, depression, and falls Referrals and appointments  In addition, I have reviewed and discussed with patient certain preventive protocols, quality metrics, and best practice recommendations. A written personalized care plan for preventive services as well as general preventive  health recommendations were provided to patient.     Randel Pigg, LPN   [id number]   Nurse Notes: none

## 2021-07-13 NOTE — Patient Instructions (Signed)
John Fry , Thank you for taking time to come for your Medicare Wellness Visit. I appreciate your ongoing commitment to your health goals. Please review the following plan we discussed and let me know if I can assist you in the future.   Screening recommendations/referrals: Colonoscopy: no longer required  Recommended yearly ophthalmology/optometry visit for glaucoma screening and checkup Recommended yearly dental visit for hygiene and checkup  Vaccinations: Influenza vaccine: will obtain  Pneumococcal vaccine: completed  Tdap vaccine: 07/10/2014 Shingles vaccine: will consider     Advanced directives: none   Conditions/risks identified: none   Next appointment: none   Preventive Care 86 Years and Older, Male Preventive care refers to lifestyle choices and visits with your health care provider that can promote health and wellness. What does preventive care include? A yearly physical exam. This is also called an annual well check. Dental exams once or twice a year. Routine eye exams. Ask your health care provider how often you should have your eyes checked. Personal lifestyle choices, including: Daily care of your teeth and gums. Regular physical activity. Eating a healthy diet. Avoiding tobacco and drug use. Limiting alcohol use. Practicing safe sex. Taking low doses of aspirin every day. Taking vitamin and mineral supplements as recommended by your health care provider. What happens during an annual well check? The services and screenings done by your health care provider during your annual well check will depend on your age, overall health, lifestyle risk factors, and family history of disease. Counseling  Your health care provider may ask you questions about your: Alcohol use. Tobacco use. Drug use. Emotional well-being. Home and relationship well-being. Sexual activity. Eating habits. History of falls. Memory and ability to understand (cognition). Work and  work Statistician. Screening  You may have the following tests or measurements: Height, weight, and BMI. Blood pressure. Lipid and cholesterol levels. These may be checked every 5 years, or more frequently if you are over 87 years old. Skin check. Lung cancer screening. You may have this screening every year starting at age 67 if you have a 30-pack-year history of smoking and currently smoke or have quit within the past 15 years. Fecal occult blood test (FOBT) of the stool. You may have this test every year starting at age 78. Flexible sigmoidoscopy or colonoscopy. You may have a sigmoidoscopy every 5 years or a colonoscopy every 10 years starting at age 60. Prostate cancer screening. Recommendations will vary depending on your family history and other risks. Hepatitis C blood test. Hepatitis B blood test. Sexually transmitted disease (STD) testing. Diabetes screening. This is done by checking your blood sugar (glucose) after you have not eaten for a while (fasting). You may have this done every 1-3 years. Abdominal aortic aneurysm (AAA) screening. You may need this if you are a current or former smoker. Osteoporosis. You may be screened starting at age 19 if you are at high risk. Talk with your health care provider about your test results, treatment options, and if necessary, the need for more tests. Vaccines  Your health care provider may recommend certain vaccines, such as: Influenza vaccine. This is recommended every year. Tetanus, diphtheria, and acellular pertussis (Tdap, Td) vaccine. You may need a Td booster every 10 years. Zoster vaccine. You may need this after age 33. Pneumococcal 13-valent conjugate (PCV13) vaccine. One dose is recommended after age 64. Pneumococcal polysaccharide (PPSV23) vaccine. One dose is recommended after age 58. Talk to your health care provider about which screenings and vaccines you need  and how often you need them. This information is not intended to  replace advice given to you by your health care provider. Make sure you discuss any questions you have with your health care provider. Document Released: 07/03/2015 Document Revised: 02/24/2016 Document Reviewed: 04/07/2015 Elsevier Interactive Patient Education  2017 [place] Prevention in the Home Falls can cause injuries. They can happen to people of all ages. There are many things you can do to make your home safe and to help prevent falls. What can I do on the outside of my home? Regularly fix the edges of walkways and driveways and fix any cracks. Remove anything that might make you trip as you walk through a door, such as a raised step or threshold. Trim any bushes or trees on the path to your home. Use bright outdoor lighting. Clear any walking paths of anything that might make someone trip, such as rocks or tools. Regularly check to see if handrails are loose or broken. Make sure that both sides of any steps have handrails. Any raised decks and porches should have guardrails on the edges. Have any leaves, snow, or ice cleared regularly. Use sand or salt on walking paths during winter. Clean up any spills in your garage right away. This includes oil or grease spills. What can I do in the bathroom? Use night lights. Install grab bars by the toilet and in the tub and shower. Do not use towel bars as grab bars. Use non-skid mats or decals in the tub or shower. If you need to sit down in the shower, use a plastic, non-slip stool. Keep the floor dry. Clean up any water that spills on the floor as soon as it happens. Remove soap buildup in the tub or shower regularly. Attach bath mats securely with double-sided non-slip rug tape. Do not have throw rugs and other things on the floor that can make you trip. What can I do in the bedroom? Use night lights. Make sure that you have a light by your bed that is easy to reach. Do not use any sheets or blankets that are too big for  your bed. They should not hang down onto the floor. Have a firm chair that has side arms. You can use this for support while you get dressed. Do not have throw rugs and other things on the floor that can make you trip. What can I do in the kitchen? Clean up any spills right away. Avoid walking on wet floors. Keep items that you use a lot in easy-to-reach places. If you need to reach something above you, use a strong step stool that has a grab bar. Keep electrical cords out of the way. Do not use floor polish or wax that makes floors slippery. If you must use wax, use non-skid floor wax. Do not have throw rugs and other things on the floor that can make you trip. What can I do with my stairs? Do not leave any items on the stairs. Make sure that there are handrails on both sides of the stairs and use them. Fix handrails that are broken or loose. Make sure that handrails are as long as the stairways. Check any carpeting to make sure that it is firmly attached to the stairs. Fix any carpet that is loose or worn. Avoid having throw rugs at the top or bottom of the stairs. If you do have throw rugs, attach them to the floor with carpet tape. Make sure that you  have a light switch at the top of the stairs and the bottom of the stairs. If you do not have them, ask someone to add them for you. What else can I do to help prevent falls? Wear shoes that: Do not have high heels. Have rubber bottoms. Are comfortable and fit you well. Are closed at the toe. Do not wear sandals. If you use a stepladder: Make sure that it is fully opened. Do not climb a closed stepladder. Make sure that both sides of the stepladder are locked into place. Ask someone to hold it for you, if possible. Clearly mark and make sure that you can see: Any grab bars or handrails. First and last steps. Where the edge of each step is. Use tools that help you move around (mobility aids) if they are needed. These  include: Canes. Walkers. Scooters. Crutches. Turn on the lights when you go into a dark area. Replace any light bulbs as soon as they burn out. Set up your furniture so you have a clear path. Avoid moving your furniture around. If any of your floors are uneven, fix them. If there are any pets around you, be aware of where they are. Review your medicines with your doctor. Some medicines can make you feel dizzy. This can increase your chance of falling. Ask your doctor what other things that you can do to help prevent falls. This information is not intended to replace advice given to you by your health care provider. Make sure you discuss any questions you have with your health care provider. Document Released: 04/02/2009 Document Revised: 11/12/2015 Document Reviewed: 07/11/2014 Elsevier Interactive Patient Education  2017 [employer].

## 2021-07-21 DIAGNOSIS — E1122 Type 2 diabetes mellitus with diabetic chronic kidney disease: Secondary | ICD-10-CM | POA: Diagnosis not present

## 2021-07-21 DIAGNOSIS — N281 Cyst of kidney, acquired: Secondary | ICD-10-CM | POA: Diagnosis not present

## 2021-07-21 DIAGNOSIS — N1832 Chronic kidney disease, stage 3b: Secondary | ICD-10-CM | POA: Diagnosis not present

## 2021-07-21 DIAGNOSIS — E669 Obesity, unspecified: Secondary | ICD-10-CM | POA: Diagnosis not present

## 2021-07-21 DIAGNOSIS — E559 Vitamin D deficiency, unspecified: Secondary | ICD-10-CM | POA: Diagnosis not present

## 2021-07-21 DIAGNOSIS — I129 Hypertensive chronic kidney disease with stage 1 through stage 4 chronic kidney disease, or unspecified chronic kidney disease: Secondary | ICD-10-CM | POA: Diagnosis not present

## 2021-07-29 DIAGNOSIS — H2513 Age-related nuclear cataract, bilateral: Secondary | ICD-10-CM | POA: Diagnosis not present

## 2021-07-29 DIAGNOSIS — H401131 Primary open-angle glaucoma, bilateral, mild stage: Secondary | ICD-10-CM | POA: Diagnosis not present

## 2021-07-29 DIAGNOSIS — E119 Type 2 diabetes mellitus without complications: Secondary | ICD-10-CM | POA: Diagnosis not present

## 2021-07-29 LAB — HM DIABETES EYE EXAM

## 2021-08-25 ENCOUNTER — Telehealth: Payer: Self-pay | Admitting: *Deleted

## 2021-08-25 NOTE — Chronic Care Management (AMB) (Signed)
?  Care Management  ? ?Note ? ?08/25/2021 ?Name: KETAN RENZ MRN: [id number] DOB: 07/25/1940 ? ?Ashlin Hidalgo Borrayo is a 81 y.o. year old male who is a primary care patient of Jessee, Mezera, MD and is actively engaged with the care management team. I reached out to Remer Macho by phone today to assist with re-scheduling an initial visit with the RN Case Manager ? ?Follow up plan: ?Unsuccessful telephone outreach attempt made. A HIPAA compliant phone message was left for the patient providing contact information and requesting a return call.  ? ?Deloss Amico, CCMA ?Care Guide, Embedded Care Coordination ?[place]  Care Management  ?Direct Dial: [phone] ? ? ?

## 2021-09-08 ENCOUNTER — Other Ambulatory Visit: Payer: Self-pay | Admitting: Family Medicine

## 2021-09-10 NOTE — Telephone Encounter (Signed)
Called patient to schedule appt for follow up, no answer unable to leave message will call back.  ?

## 2021-09-14 NOTE — Chronic Care Management (AMB) (Signed)
?  Care Management  ? ?Note ? ?09/14/2021 ?Name: EARVIN BLAZIER MRN: [id number] DOB: 15-Apr-1941 ? ?Melville Engen Davia is a 81 y.o. year old male who is a primary care patient of Har[place], [place], MD and is actively engaged with the care management team. I reached out to Remer Macho by phone today to assist with re-scheduling an initial visit with the RN Case Manager ? ?Follow up plan: ?2nd Unsuccessful telephone outreach attempt made. A HIPAA compliant phone message was left for the patient providing contact information and requesting a return call.  ? ?Jnaya Butrick, CCMA ?Care Guide, Embedded Care Coordination ?[place]  Care Management  ?Direct Dial: [phone] ? ? ?

## 2021-09-21 ENCOUNTER — Other Ambulatory Visit: Payer: Self-pay | Admitting: Family Medicine

## 2021-09-21 ENCOUNTER — Encounter: Payer: Self-pay | Admitting: Diagnostic Neuroimaging

## 2021-09-21 ENCOUNTER — Ambulatory Visit: Payer: Medicare HMO | Admitting: Diagnostic Neuroimaging

## 2021-09-21 VITALS — BP 141/80 | HR 75 | Ht 67.0 in | Wt 215.4 lb

## 2021-09-21 DIAGNOSIS — R519 Headache, unspecified: Secondary | ICD-10-CM

## 2021-09-21 DIAGNOSIS — E236 Other disorders of pituitary gland: Secondary | ICD-10-CM

## 2021-09-21 DIAGNOSIS — G44059 Short lasting unilateral neuralgiform headache with conjunctival injection and tearing (SUNCT), not intractable: Secondary | ICD-10-CM

## 2021-09-21 MED ORDER — LAMOTRIGINE 25 MG PO TABS: 50.0000 mg | ORAL_TABLET | Freq: Two times a day (BID) | ORAL | 4 refills | Status: DC

## 2021-09-21 NOTE — Progress Notes (Signed)
? ?[facility] ? ?PATIENT: John Fry ?DOB: 04/23/1941 ? ?REFERRING CLINICIAN: Libby Maw,* ?HISTORY FROM: patient  ?REASON FOR VISIT: follow up ? ? ?HISTORICAL ? ?CHIEF COMPLAINT:  ?Chief Complaint  ?Patient presents with  ? SUNCT  ?  Rm 7, 6 month FU  "doing a lot better now"   ? ? ?HISTORY OF PRESENT ILLNESS:  ? ?UPDATE (09/21/21, VRP): Since last visit, doing well. Symptoms are stable. No more HA pain. Tolerating LTG 64m twice a day. ? ?UPDATE (03/24/2021, VRP): Since last visit, doing BETTER.  Lamotrigine seems to be helping symptoms. Sometimes when pain starts he places pressure on his left maxillary region and nose and this seems to symptoms.   ? ?PRIOR HPI (11/25/20, VRP): 81year old male here for evaluation of left and facial pain.  Symptoms started 2021 with intermittent episodes of pain starting from the medial aspect of his left eye rating to his left maxillary region and left supraorbital region.  Symptoms last about 5 to 10 minutes at a time.  Sometimes his eye waters and he feels nasal congestion and develops redness in the eye.  Patient has been to ENT and had a CT maxillofacial scans which were unremarkable.  He has been treated.  With antibiotics which have not helped.  Symptoms have been almost on a daily basis or every other day. ? ? ?REVIEW OF SYSTEMS: Full 14 system review of systems performed and negative with exception of: as per HPI.  ? ?ALLERGIES: ?Allergies  ?Allergen Reactions  ? Zestril [Lisinopril]   ?  angioedema  ? ? ?HOME MEDICATIONS: ?Outpatient Medications Prior to Visit  ?Medication Sig Dispense Refill  ? Alcohol Swabs (B-D SINGLE USE SWABS REGULAR) PADS USE  DAILY 200 each 0  ? amLODipine (NORVASC) 10 MG tablet TAKE 1 TABLET EVERY DAY 90 tablet 0  ? atorvastatin (LIPITOR) 40 MG tablet TAKE 1 TABLET EVERY DAY 90 tablet 3  ? Blood Glucose Monitoring Suppl (TRUE METRIX METER) w/Device KIT USE AS DIRECTED 1 kit 0  ? fluticasone (FLONASE) 50  MCG/ACT nasal spray Place 1 spray into both nostrils daily. 16 g 2  ? hydrALAZINE (APRESOLINE) 50 MG tablet Take 1 tablet (50 mg total) by mouth 3 (three) times daily. 270 tablet 1  ? hydrochlorothiazide (HYDRODIURIL) 25 MG tablet TAKE 1 TABLET EVERY DAY 90 tablet 0  ? insulin NPH Human (NOVOLIN N) 100 UNIT/ML injection Inject 0.34 mLs (34 Units total) into the skin at bedtime. 20 mL 11  ? insulin regular (HUMULIN R) 100 units/mL injection Inject 0.4 mLs (40 Units total) into the skin 2 (two) times daily before a meal. 80 mL 11  ? Semaglutide,0.25 or 0.5MG/DOS, (OZEMPIC, 0.25 OR 0.5 MG/DOSE,) 2 MG/1.5ML SOPN Inject 0.25 mg into the skin once a week. 3 mL 3  ? sildenafil (VIAGRA) 100 MG tablet Take 100 mg by mouth daily as needed.    ? TRUE METRIX BLOOD GLUCOSE TEST test strip CHECK BLOOD SUGAR TWICE DAILY (APPOINTMENT IS NEEDED FOR FURTHER REFILLS) 200 strip 0  ? lamoTRIgine (LAMICTAL) 25 MG tablet 710mtwice a day x 2 weeks; then 10053mwice a day (Patient taking differently: Take 50 mg by mouth 2 (two) times daily.) 720 tablet 3  ? ?No facility-administered medications prior to visit.  ? ? ?PAST MEDICAL HISTORY: ?Past Medical History:  ?Diagnosis Date  ? [facility] mellitus without [facility] ([facility] ? DYSLIPIDEMIA 03/05/2007  ? Helicobacter pylori (H. pylori) 09/25/2013  ? HERNIA 03/05/2007  ?  HYPERTENSION 03/05/2007  ? Prostate cancer ([place]) 07/23/13  ? Gleason 7, volume 53 ml  ? RENAL INSUFFICIENCY 05/22/2008  ? S/P radiation therapy 04/14/2014 through 06/11/2014                                                     ? Prostate 7800 cGy in 40 sessions, seminal vesicles 5600 cGy in 40 sessions                        ? SHOULDER PAIN, RIGHT 05/08/2008  ? 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 ? ?PAST SURGICAL HISTORY: ?Past Surgical History:  ?Procedure Laterality Date  ? FOOT SURGERY  1999  ? Right Foot Bunion Repair  ? Lower Arterial  05/06/2003  ? PROSTATE BIOPSY  07/23/13  ? gleason 7, volume 53 ml  ? remove fatty  tumor Left 1995  ? thigh  ? ? ?FAMILY HISTORY: ?Family History  ?Problem Relation Age of Onset  ? Cancer Father 11  ?     prostate  ? Kidney Stones Brother   ? Diabetes Sister   ? Hypertension Neg Hx   ? Colon cancer Neg Hx   ? ? ?SOCIAL HISTORY: ?Social History  ? ?Socioeconomic History  ? Marital status: Widowed  ?  Spouse name: Not on file  ? Number of children: 2  ? Years of education: Not on file  ? Highest education level: Not on file  ?Occupational History  ?  Comment: Retired  ?Tobacco Use  ? Smoking status: Former  ?  Packs/day: 0.50  ?  Years: 20.00  ?  Pack years: 10.00  ?  Types: Cigarettes  ?  Quit date: 06/20/2006  ?  Years since quitting: 15.2  ? Smokeless tobacco: Never  ?Substance and Sexual Activity  ? Alcohol use: No  ?  Alcohol/week: 0.0 standard drinks  ?  Comment: quit 17 years ago  ? Drug use: No  ? Sexual activity: Not Currently  ?Other Topics Concern  ? Not on file  ?Social History Narrative  ? Lives alone  ? ?Social Determinants of Health  ? ?Financial Resource Strain: Low Risk   ? Difficulty of Paying Living Expenses: Not hard at all  ?Food Insecurity: No Food Insecurity  ? Worried About Charity fundraiser in the Last Year: Never true  ? Ran Out of Food in the Last Year: Never true  ?Transportation Needs: No Transportation Needs  ? Lack of Transportation (Medical): No  ? Lack of Transportation (Non-Medical): No  ?Physical Activity: Sufficiently Active  ? Days of Exercise per Week: 5 days  ? Minutes of Exercise per Session: 60 min  ?Stress: No Stress Concern Present  ? Feeling of Stress : Not at all  ?Social Connections: Moderately Isolated  ? Frequency of Communication with Friends and Family: Three times a week  ? Frequency of Social Gatherings with Friends and Family: Three times a week  ? Attends Religious Services: More than 4 times per year  ? Active Member of Clubs or Organizations: No  ? Attends Archivist Meetings: Never  ? Marital Status: Widowed  ?Intimate Partner  Violence: Not At Risk  ? Fear of Current or Ex-Partner: No  ? Emotionally Abused: No  ? Physically Abused: No  ? Sexually Abused: No  ? ? ? ?  PHYSICAL EXAM ? ?GENERAL EXAM/CONSTITUTIONAL: ?Vitals:  ?Vitals:  ? 09/21/21 1437  ?BP: (!) 141/80  ?Pulse: 75  ?Weight: 215 lb 6.4 oz (97.7 kg)  ?Height: 5' 7" (1.702 m)  ? ?Body mass index is 33.74 kg/m?. ?Wt Readings from Last 3 Encounters:  ?09/21/21 215 lb 6.4 oz (97.7 kg)  ?03/24/21 221 lb 12.8 oz (100.6 kg)  ?11/25/20 226 lb (102.5 kg)  ? ?Patient is in no distress; well developed, nourished and groomed; neck is supple ? ?CARDIOVASCULAR: ?Examination of carotid arteries is normal; no carotid bruits ?Regular rate and rhythm, no murmurs ?Examination of peripheral vascular system by observation and palpation is normal ? ?EYES: ?Ophthalmoscopic exam of optic discs and posterior segments is normal; no papilledema or hemorrhages; CONJUNCTIVAL INJECTION ?No results found. ? ?MUSCULOSKELETAL: ?Gait, strength, tone, movements noted in Neurologic exam below ? ?NEUROLOGIC: ?MENTAL STATUS:  ?   ? View : No data to display.  ?  ?  ?  ? ?awake, alert, oriented to person, place and time ?recent and remote memory intact ?normal attention and concentration ?language fluent, comprehension intact, naming intact ?fund of knowledge appropriate ? ?CRANIAL NERVE:  ?2nd - no papilledema on fundoscopic exam ?2nd, 3rd, 4th, 6th - pupils equal and reactive to light, visual fields full to confrontation, extraocular muscles intact, no nystagmus ?5th - facial sensation symmetric ?7th - facial strength symmetric ?8th - hearing intact ?9th - palate elevates symmetrically, uvula midline ?11th - shoulder shrug symmetric ?12th - tongue protrusion midline ? ?MOTOR:  ?normal bulk and tone, full strength in the BUE, BLE ? ?SENSORY:  ?normal and symmetric to light touch, temperature, vibration ? ?COORDINATION:  ?finger-nose-finger, fine finger movements normal ? ?REFLEXES:  ?deep tendon reflexes present and  symmetric ? ?GAIT/STATION:  ?narrow based gait ? ? ? ? ?DIAGNOSTIC DATA (LABS, IMAGING, TESTING) ?- I reviewed patient records, labs, notes, testing and imaging myself where available. ? ?Lab Results  ?Component Val

## 2021-09-23 ENCOUNTER — Telehealth: Payer: Self-pay | Admitting: Diagnostic Neuroimaging

## 2021-09-23 NOTE — Telephone Encounter (Signed)
[employer] FBPP:[id number] (exp. 09/23/21 to 10/23/21) order sent to GI, they will reach out to the patient to schedule.  ?

## 2021-09-29 ENCOUNTER — Ambulatory Visit: Payer: Medicare HMO | Admitting: Diagnostic Neuroimaging

## 2021-10-05 NOTE — Chronic Care Management (AMB) (Signed)
?  Care Management  ? ?Note ? ?10/05/2021 ?Name: John Fry MRN: [id number] DOB: 01-Apr-1941 ? ?John Fry is a 81 y.o. year old male who is a primary care patient of Merel, Santoli, MD and is actively engaged with the care management team. I reached out to Remer Macho by phone today to assist with scheduling an initial visit with the RN Case Manager ? ?Follow up plan: ?We have been unable to make contact with the patient for follow up. The care management team is available to follow up with the patient after provider conversation with the patient regarding recommendation for care management engagement and subsequent re-referral to the care management team.  ? ?Luke Rigsbee, CCMA ?Care Guide, Embedded Care Coordination ?[facility]  Care Management  ?Direct Dial: [phone] ? ? ?

## 2021-10-08 ENCOUNTER — Ambulatory Visit (INDEPENDENT_AMBULATORY_CARE_PROVIDER_SITE_OTHER): Payer: Medicare HMO | Admitting: Family Medicine

## 2021-10-08 ENCOUNTER — Encounter: Payer: Self-pay | Admitting: Family Medicine

## 2021-10-08 VITALS — BP 130/74 | HR 78 | Temp 98.0°F | Ht 67.0 in | Wt 210.2 lb

## 2021-10-08 DIAGNOSIS — E785 Hyperlipidemia, unspecified: Secondary | ICD-10-CM | POA: Diagnosis not present

## 2021-10-08 DIAGNOSIS — I10 Essential (primary) hypertension: Secondary | ICD-10-CM

## 2021-10-08 DIAGNOSIS — N1832 Chronic kidney disease, stage 3b: Secondary | ICD-10-CM

## 2021-10-08 NOTE — Progress Notes (Addendum)
? ?Established Patient Office Visit ? ?Subjective   ?Patient ID: John Fry, male    DOB: Oct 13, 1940  Age: 81 y.o. MRN: [id number] ? ?Chief Complaint  ?Patient presents with  ? Follow-up  ?  Routine follow up on medications, no concerns. Patient not fasting.   ? ? ?HPI here for follow-up of hypertension, elevated cholesterol and stage IIIb CKD.  Lost to follow-up.  No recent follow-up with endocrinology.  Blood pressure controlled with hydralazine 50 3 times daily, HCTZ 25 daily and amlodipine.  Patient developed angioedema lisinopril.  Continues moderate dose of atorvastatin for cholesterol control.  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 ? ? ? ?Review of Systems  ?Constitutional: Negative.   ?HENT: Negative.    ?Eyes:  Negative for blurred vision, discharge and redness.  ?Respiratory: Negative.    ?Cardiovascular: Negative.   ?Gastrointestinal:  Negative for abdominal pain, blood in stool and constipation.  ?Genitourinary: Negative.   ?Musculoskeletal: Negative.  Negative for myalgias.  ?Skin:  Negative for rash.  ?Neurological:  Negative for tingling, loss of consciousness and weakness.  ?Endo/Heme/Allergies:  Negative for polydipsia.  ? ?  ? ?  10/08/2021  ?  4:22 PM 07/13/2021  ?  2:24 PM 07/13/2021  ?  2:19 PM  ?Depression screen PHQ 2/9  ?Decreased Interest 0 0 0  ?Down, Depressed, Hopeless 0 0 0  ?PHQ - 2 Score 0 0 0  ? ? ? ?Objective:  ?  ? ?BP 130/74 (BP Location: Right Arm, Patient Position: Sitting, Cuff Size: Large)   Pulse 78   Temp 98 ?F (36.7 ?C) (Temporal)   Ht '5\' 7"'$  (1.702 m)   Wt 210 lb 3.2 oz (95.3 kg)   SpO2 95%   BMI 32.92 kg/m?  ? ? ?Physical Exam ?Constitutional:   ?   General: He is not in acute distress. ?   Appearance: Normal appearance. He is not ill-appearing, toxic-appearing or diaphoretic.  ?HENT:  ?   Head: Normocephalic and atraumatic.  ?   Right Ear: External ear normal.  ?   Left Ear: External ear normal.  ?   Mouth/Throat:  ?   Mouth:  Mucous membranes are moist.  ?   Pharynx: Oropharynx is clear. No oropharyngeal exudate or posterior oropharyngeal erythema.  ?Eyes:  ?   General: No scleral icterus.    ?   Right eye: No discharge.     ?   Left eye: No discharge.  ?   Extraocular Movements: Extraocular movements intact.  ?   Conjunctiva/sclera: Conjunctivae normal.  ?   Pupils: Pupils are equal, round, and reactive to light.  ?Cardiovascular:  ?   Rate and Rhythm: Normal rate and regular rhythm.  ?Pulmonary:  ?   Effort: Pulmonary effort is normal. No respiratory distress.  ?   Breath sounds: Normal breath sounds.  ?Abdominal:  ?   Tenderness: There is no guarding.  ?Musculoskeletal:  ?   Cervical back: No rigidity or tenderness.  ?Skin: ?   General: Skin is warm and dry.  ?Neurological:  ?   Mental Status: He is alert and oriented to person, place, and time.  ?Psychiatric:     ?   Mood and Affect: Mood normal.     ?   Behavior: Behavior normal.  ? ? ? ?Results for orders placed or performed in visit on 10/08/21  ?MICROSCOPIC MESSAGE  ?Result Value Ref Range  ? Note    ?CBC  ?Result  Value Ref Range  ? WBC 8.5 3.8 - 10.8 Thousand/uL  ? RBC 4.56 4.20 - 5.80 Million/uL  ? Hemoglobin 14.4 13.2 - 17.1 g/dL  ? HCT 42.2 38.5 - 50.0 %  ? MCV 92.5 80.0 - 100.0 fL  ? MCH 31.6 27.0 - 33.0 pg  ? MCHC 34.1 32.0 - 36.0 g/dL  ? RDW 13.7 11.0 - 15.0 %  ? Platelets 266 140 - 400 Thousand/uL  ? MPV 10.4 7.5 - 12.5 fL  ?Comprehensive metabolic panel  ?Result Value Ref Range  ? Glucose, Bld 77 65 - 99 mg/dL  ? BUN 29 (H) 7 - 25 mg/dL  ? Creat 2.64 (H) 0.70 - 1.22 mg/dL  ? BUN/Creatinine Ratio 11 6 - 22 (calc)  ? Sodium 139 135 - 146 mmol/L  ? Potassium 3.7 3.5 - 5.3 mmol/L  ? Chloride 104 98 - 110 mmol/L  ? CO2 22 20 - 32 mmol/L  ? Calcium 9.9 8.6 - 10.3 mg/dL  ? Total Protein 7.1 6.1 - 8.1 g/dL  ? Albumin 4.7 3.6 - 5.1 g/dL  ? Globulin 2.4 1.9 - 3.7 g/dL (calc)  ? AG Ratio 2.0 1.0 - 2.5 (calc)  ? Total Bilirubin 0.5 0.2 - 1.2 mg/dL  ? Alkaline phosphatase (APISO) 76  35 - 144 U/L  ? AST 33 10 - 35 U/L  ? ALT 25 9 - 46 U/L  ?Lipid panel  ?Result Value Ref Range  ? Cholesterol 128 <200 mg/dL  ? HDL 41 > OR = 40 mg/dL  ? Triglycerides 181 (H) <150 mg/dL  ? LDL Cholesterol (Calc) 61 mg/dL (calc)  ? Total CHOL/HDL Ratio 3.1 <5.0 (calc)  ? Non-HDL Cholesterol (Calc) 87 <130 mg/dL (calc)  ?Microalbumin / creatinine urine ratio  ?Result Value Ref Range  ? Creatinine, Urine 179 20 - 320 mg/dL  ? Microalb, Ur 4.0 mg/dL  ? Microalb Creat Ratio 22 <30 mcg/mg creat  ?Urinalysis, Routine w reflex microscopic  ?Result Value Ref Range  ? Color, Urine YELLOW YELLOW  ? APPearance CLEAR CLEAR  ? Specific Gravity, Urine 1.017 1.001 - 1.035  ? pH < OR = 5.0 5.0 - 8.0  ? Glucose, UA NEGATIVE NEGATIVE  ? Bilirubin Urine NEGATIVE NEGATIVE  ? Ketones, ur NEGATIVE NEGATIVE  ? Hgb urine dipstick NEGATIVE NEGATIVE  ? Protein, ur NEGATIVE NEGATIVE  ? Nitrite NEGATIVE NEGATIVE  ? Leukocytes,Ua TRACE (A) NEGATIVE  ? WBC, UA 0-5 0 - 5 /HPF  ? RBC / HPF 0-2 0 - 2 /HPF  ? Squamous Epithelial / LPF 0-5 < OR = 5 /HPF  ? Bacteria, UA NONE SEEN NONE SEEN /HPF  ? Hyaline Cast 0-5 (A) NONE SEEN /LPF  ? ? ? ? ?The ASCVD Risk score (Arnett DK, et al., 2019) failed to calculate for the following reasons: ?  The 2019 ASCVD risk score is only valid for ages 72 to 39 ? ? Estimated Creatinine Clearance: 24.1 mL/min (A) (by C-G formula based on SCr of 2.64 mg/dL (H)).  ?Assessment & Plan:  ? ?Problem List Items Addressed This Visit   ? ?  ? Cardiovascular and Mediastinum  ? Essential hypertension - Primary  ? Relevant Orders  ? CBC (Completed)  ? Comprehensive metabolic panel (Completed)  ? Microalbumin / creatinine urine ratio (Completed)  ? Urinalysis, Routine w reflex microscopic (Completed)  ?  ? Genitourinary  ? Stage 3b chronic kidney disease ([place])  ? Relevant Orders  ? Comprehensive metabolic panel (Completed)  ? Microalbumin / creatinine  urine ratio (Completed)  ? Urinalysis, Routine w reflex microscopic (Completed)   ? Ambulatory referral to Nephrology  ?  ? Other  ? Dyslipidemia  ? Relevant Orders  ? Comprehensive metabolic panel (Completed)  ? Lipid panel (Completed)  ? ? ?Return in about 6 months (around 04/09/2022), or if symptoms worsen or fail to improve.  ? ?He will schedule follow-up with endocrinology on Monday.  We will consider an SGLT2 inhibitor. ?Libby Maw, MD ? ?

## 2021-10-09 LAB — URINALYSIS, ROUTINE W REFLEX MICROSCOPIC
Bacteria, UA: NONE SEEN /HPF
Bilirubin Urine: NEGATIVE
Glucose, UA: NEGATIVE
Hgb urine dipstick: NEGATIVE
Ketones, ur: NEGATIVE
Nitrite: NEGATIVE
Protein, ur: NEGATIVE
Specific Gravity, Urine: 1.017 (ref 1.001–1.035)
pH: <=5 (ref 5.0–8.0)

## 2021-10-09 LAB — MICROALBUMIN / CREATININE URINE RATIO
Creatinine, Urine: 179 mg/dL (ref 20–320)
Microalb Creat Ratio: 22 mcg/mg creat (ref <30)
Microalb, Ur: 4 mg/dL

## 2021-10-09 LAB — COMPREHENSIVE METABOLIC PANEL
AG Ratio: 2 (calc) (ref 1.0–2.5)
ALT: 25 U/L (ref 9–46)
AST: 33 U/L (ref 10–35)
Albumin: 4.7 g/dL (ref 3.6–5.1)
Alkaline phosphatase (APISO): 76 U/L (ref 35–144)
BUN/Creatinine Ratio: 11 (calc) (ref 6–22)
BUN: 29 mg/dL — ABNORMAL HIGH (ref 7–25)
CO2: 22 mmol/L (ref 20–32)
Calcium: 9.9 mg/dL (ref 8.6–10.3)
Chloride: 104 mmol/L (ref 98–110)
Creat: 2.64 mg/dL — ABNORMAL HIGH (ref 0.70–1.22)
Globulin: 2.4 g/dL (calc) (ref 1.9–3.7)
Glucose, Bld: 77 mg/dL (ref 65–99)
Potassium: 3.7 mmol/L (ref 3.5–5.3)
Sodium: 139 mmol/L (ref 135–146)
Total Bilirubin: 0.5 mg/dL (ref 0.2–1.2)
Total Protein: 7.1 g/dL (ref 6.1–8.1)

## 2021-10-09 LAB — LIPID PANEL
Cholesterol: 128 mg/dL (ref <200)
HDL: 41 mg/dL (ref >=40)
LDL Cholesterol (Calc): 61 mg/dL (calc)
Non-HDL Cholesterol (Calc): 87 mg/dL (calc) (ref <130)
Total CHOL/HDL Ratio: 3.1 (calc) (ref <5.0)
Triglycerides: 181 mg/dL — ABNORMAL HIGH (ref <150)

## 2021-10-09 LAB — CBC
HCT: 42.2 % (ref 38.5–50.0)
Hemoglobin: 14.4 g/dL (ref 13.2–17.1)
MCH: 31.6 pg (ref 27.0–33.0)
MCHC: 34.1 g/dL (ref 32.0–36.0)
MCV: 92.5 fL (ref 80.0–100.0)
MPV: 10.4 fL (ref 7.5–12.5)
Platelets: 266 10*3/uL (ref 140–400)
RBC: 4.56 10*6/uL (ref 4.20–5.80)
RDW: 13.7 % (ref 11.0–15.0)
WBC: 8.5 10*3/uL (ref 3.8–10.8)

## 2021-10-09 LAB — MICROSCOPIC MESSAGE

## 2021-10-12 NOTE — Addendum Note (Signed)
Addended by: Jon Billings on: 10/12/2021 10:08 AM ? ? Modules accepted: Orders ? ?

## 2021-10-14 ENCOUNTER — Other Ambulatory Visit: Payer: Medicare HMO

## 2021-11-30 DIAGNOSIS — E119 Type 2 diabetes mellitus without complications: Secondary | ICD-10-CM | POA: Diagnosis not present

## 2021-11-30 DIAGNOSIS — H401131 Primary open-angle glaucoma, bilateral, mild stage: Secondary | ICD-10-CM | POA: Diagnosis not present

## 2022-01-18 ENCOUNTER — Other Ambulatory Visit: Payer: Self-pay

## 2022-01-18 NOTE — Patient Outreach (Signed)
[place] [facility]) Care Management  01/18/2022  John Fry [birth year] [id number]    Telephone call to patient for nurse call.  No answer. Unable to leave a message.  Plan: RN CM will attempt again within 4 business days and send letter.  [name], RN, MSN [facility] Care Management Care Management Coordinator Direct Line [phone] Toll Free: [phone]  Fax: [phone]

## 2022-01-19 ENCOUNTER — Other Ambulatory Visit: Payer: Self-pay

## 2022-01-19 ENCOUNTER — Ambulatory Visit: Payer: Self-pay

## 2022-01-19 NOTE — Patient Outreach (Signed)
[place] [facility]) Care Management  01/19/2022  John Fry 08/14/1940 [id number]   Telephone call to patient for nurse call.  No answer. Unable to leave a message.  Plan: RN CM will attempt again within 4 business days.  [name], RN, MSN [facility] Care Management Care Management Coordinator Direct Line [phone] Toll Free: [phone]  Fax: [phone]

## 2022-01-24 ENCOUNTER — Other Ambulatory Visit: Payer: Self-pay

## 2022-01-24 NOTE — Patient Outreach (Signed)
[facility]) Care Management  01/24/2022  John Fry 1940-09-16 [id number]    Telephone call to patient for nurse call.  No answer. Unable to leave a message.  Plan: RN CM will attempt again within 3 weeks.  [name], RN, MSN [facility] Care Management Care Management Coordinator Direct Line [phone] Toll Free: [phone]  Fax: [phone]

## 2022-01-25 DIAGNOSIS — N183 Chronic kidney disease, stage 3 unspecified: Secondary | ICD-10-CM | POA: Diagnosis not present

## 2022-02-01 ENCOUNTER — Ambulatory Visit: Payer: Self-pay

## 2022-02-02 ENCOUNTER — Other Ambulatory Visit: Payer: Self-pay

## 2022-02-02 NOTE — Patient Outreach (Signed)
[place] [facility]) Care Management  02/02/2022  Khoen Genet Scarpelli 01-31-1941 [id number]   Telephone call to patient for nurse call. No answer.  Unable to leave a message.    Plan: RN CM will close case.    [name], RN, MSN [facility] Care Management Care Management Coordinator Direct Line [phone] Toll Free: [phone]  Fax: [phone]

## 2022-02-03 DIAGNOSIS — E559 Vitamin D deficiency, unspecified: Secondary | ICD-10-CM | POA: Diagnosis not present

## 2022-02-03 DIAGNOSIS — E669 Obesity, unspecified: Secondary | ICD-10-CM | POA: Diagnosis not present

## 2022-02-03 DIAGNOSIS — E1122 Type 2 diabetes mellitus with diabetic chronic kidney disease: Secondary | ICD-10-CM | POA: Diagnosis not present

## 2022-02-03 DIAGNOSIS — I129 Hypertensive chronic kidney disease with stage 1 through stage 4 chronic kidney disease, or unspecified chronic kidney disease: Secondary | ICD-10-CM | POA: Diagnosis not present

## 2022-02-03 DIAGNOSIS — N1832 Chronic kidney disease, stage 3b: Secondary | ICD-10-CM | POA: Diagnosis not present

## 2022-02-23 ENCOUNTER — Other Ambulatory Visit: Payer: Self-pay | Admitting: Family Medicine

## 2022-02-24 ENCOUNTER — Other Ambulatory Visit: Payer: Self-pay | Admitting: Family Medicine

## 2022-02-24 DIAGNOSIS — I10 Essential (primary) hypertension: Secondary | ICD-10-CM

## 2022-03-23 DIAGNOSIS — C61 Malignant neoplasm of prostate: Secondary | ICD-10-CM | POA: Diagnosis not present

## 2022-03-29 DIAGNOSIS — C61 Malignant neoplasm of prostate: Secondary | ICD-10-CM | POA: Diagnosis not present

## 2022-03-29 DIAGNOSIS — R350 Frequency of micturition: Secondary | ICD-10-CM | POA: Diagnosis not present

## 2022-04-11 ENCOUNTER — Ambulatory Visit: Payer: Medicare HMO | Admitting: Family Medicine

## 2022-04-11 ENCOUNTER — Telehealth: Payer: Self-pay | Admitting: Family Medicine

## 2022-04-11 NOTE — Telephone Encounter (Signed)
Pt was a no show 10/23 for an OV with Dr. Ethelene Hal. His last no show was 03/15/2021. Letter has been sent

## 2022-04-12 NOTE — Telephone Encounter (Signed)
1st no show w/in 12 months, fee waived, letter sent 

## 2022-04-26 ENCOUNTER — Ambulatory Visit (INDEPENDENT_AMBULATORY_CARE_PROVIDER_SITE_OTHER): Payer: Medicare HMO | Admitting: Family Medicine

## 2022-04-26 ENCOUNTER — Encounter: Payer: Self-pay | Admitting: Family Medicine

## 2022-04-26 VITALS — BP 159/75 | HR 83 | Temp 98.0°F | Ht 67.0 in | Wt 215.8 lb

## 2022-04-26 DIAGNOSIS — I10 Essential (primary) hypertension: Secondary | ICD-10-CM

## 2022-04-26 DIAGNOSIS — E785 Hyperlipidemia, unspecified: Secondary | ICD-10-CM | POA: Diagnosis not present

## 2022-04-26 DIAGNOSIS — N1832 Chronic kidney disease, stage 3b: Secondary | ICD-10-CM | POA: Diagnosis not present

## 2022-04-26 DIAGNOSIS — N184 Chronic kidney disease, stage 4 (severe): Secondary | ICD-10-CM

## 2022-04-26 DIAGNOSIS — E1022 Type 1 diabetes mellitus with diabetic chronic kidney disease: Secondary | ICD-10-CM | POA: Diagnosis not present

## 2022-04-26 DIAGNOSIS — Z23 Encounter for immunization: Secondary | ICD-10-CM | POA: Diagnosis not present

## 2022-04-26 LAB — HEMOGLOBIN A1C: Hgb A1c MFr Bld: 7.1 % — ABNORMAL HIGH (ref 4.6–6.5)

## 2022-04-26 LAB — BASIC METABOLIC PANEL
BUN: 23 mg/dL (ref 6–23)
CO2: 24 mEq/L (ref 19–32)
Calcium: 10 mg/dL (ref 8.4–10.5)
Chloride: 104 mEq/L (ref 96–112)
Creatinine, Ser: 1.7 mg/dL — ABNORMAL HIGH (ref 0.40–1.50)
GFR: 37.23 mL/min — ABNORMAL LOW (ref >60.00)
Glucose, Bld: 108 mg/dL — ABNORMAL HIGH (ref 70–99)
Potassium: 3.8 mEq/L (ref 3.5–5.1)
Sodium: 138 mEq/L (ref 135–145)

## 2022-04-26 LAB — LDL CHOLESTEROL, DIRECT: Direct LDL: 56 mg/dL

## 2022-04-26 MED ORDER — INSULIN REGULAR HUMAN 100 UNIT/ML IJ SOLN
25.0000 [IU] | Freq: Two times a day (BID) | INTRAMUSCULAR | 11 refills | Status: AC
Start: 2022-04-26 — End: ?

## 2022-04-26 MED ORDER — INSULIN NPH (HUMAN) (ISOPHANE) 100 UNIT/ML ~~LOC~~ SUSP: 30.0000 [IU] | Freq: Every day | SUBCUTANEOUS | 11 refills | Status: DC

## 2022-04-26 NOTE — Progress Notes (Signed)
Established Patient Office Visit  Subjective   Patient ID: John Fry, male    DOB: November 21, 1940  Age: 81 y.o. MRN: [id number]  Chief Complaint  Patient presents with   Follow-up    6 month follow up HTN no concerns not fasting    HPI follow-up of diabetes, hypertension and, elevated ldl cholesterol and CKD.  Recent visit with nephrology.  He said that they were pleased that things have been stable.  Continues with amlodipine 10 mg, hydralazine 50 mg 3 times daily and HCTZ 25 mg daily for hypertension.  Continues with atorvastatin 40 for dyslipidemia.  Medical record shows him to be a type I diabetic but he always thought that he was type II.  He is Currently taking 30 units of NPH at night before bed and 25 units of Novolin R with his morning and evening meals.  He was taking Ozempic 0.25 weekly but ran out of it.  Has been lost to follow-up with endocrinology.    Review of Systems  Constitutional: Negative.   HENT: Negative.    Eyes:  Negative for blurred vision, discharge and redness.  Respiratory: Negative.    Cardiovascular: Negative.   Gastrointestinal:  Negative for abdominal pain.  Genitourinary: Negative.   Musculoskeletal: Negative.  Negative for myalgias.  Skin:  Negative for rash.  Neurological:  Negative for tingling, loss of consciousness and weakness.  Endo/Heme/Allergies:  Negative for polydipsia.      Objective:     BP (!) 159/75 (BP Location: Right Arm)   Pulse 83   Temp 98 F (36.7 C) (Temporal)   Ht '5\' 7"'$  (1.702 m)   Wt 215 lb 12.8 oz (97.9 kg)   SpO2 96%   BMI 33.80 kg/m  BP Readings from Last 3 Encounters:  04/26/22 (!) 159/75  10/08/21 130/74  09/21/21 (!) 141/80   Wt Readings from Last 3 Encounters:  04/26/22 215 lb 12.8 oz (97.9 kg)  10/08/21 210 lb 3.2 oz (95.3 kg)  09/21/21 215 lb 6.4 oz (97.7 kg)      Physical Exam Constitutional:      General: He is not in acute distress.    Appearance: Normal appearance. He is not  ill-appearing, toxic-appearing or diaphoretic.  HENT:     Head: Normocephalic and atraumatic.     Right Ear: External ear normal.     Left Ear: External ear normal.     Mouth/Throat:     Mouth: Mucous membranes are moist.     Pharynx: Oropharynx is clear. No oropharyngeal exudate or posterior oropharyngeal erythema.  Eyes:     General: No scleral icterus.       Right eye: No discharge.        Left eye: No discharge.     Extraocular Movements: Extraocular movements intact.     Conjunctiva/sclera: Conjunctivae normal.     Pupils: Pupils are equal, round, and reactive to light.  Cardiovascular:     Rate and Rhythm: Normal rate and regular rhythm.  Pulmonary:     Effort: Pulmonary effort is normal. No respiratory distress.     Breath sounds: Normal breath sounds.  Abdominal:     General: Bowel sounds are normal.  Musculoskeletal:     Cervical back: No rigidity or tenderness.  Skin:    General: Skin is warm and dry.  Neurological:     Mental Status: He is alert and oriented to person, place, and time.  Psychiatric:        Mood and  Affect: Mood normal.        Behavior: Behavior normal.      No results found for any visits on 04/26/22.    The ASCVD Risk score (Arnett DK, et al., 2019) failed to calculate for the following reasons:   The 2019 ASCVD risk score is only valid for ages 68 to 7    Assessment & Plan:   Problem List Items Addressed This Visit       Cardiovascular and Mediastinum   Essential hypertension   Relevant Orders   Basic metabolic panel     Endocrine   Diabetes ([place]) - Primary   Relevant Medications   insulin regular (HUMULIN R) 100 units/mL injection   insulin NPH Human (NOVOLIN N) 100 UNIT/ML injection   Other Relevant Orders   Basic metabolic panel   Hemoglobin A1c     Genitourinary   Stage 3b chronic kidney disease ([place])   Relevant Orders   Basic metabolic panel     Other   Dyslipidemia   Relevant Orders   LDL cholesterol, direct    Other Visit Diagnoses     Need for influenza vaccination       Relevant Orders   Flu vaccine HIGH DOSE PF (Fluzone High dose)       Return in about 1 month (around 05/26/2022).  Has been compliant with all of his medications.  Blood pressure elevated today.  He will obtain a blood pressure cuff and check his pressures over the next month and then return.  Flu shot today.  Advised him to have that fall COVID and RSV vaccines through his pharmacy.  Libby Maw, MD

## 2022-05-26 ENCOUNTER — Ambulatory Visit (INDEPENDENT_AMBULATORY_CARE_PROVIDER_SITE_OTHER): Payer: Medicare HMO | Admitting: Family Medicine

## 2022-05-26 ENCOUNTER — Encounter: Payer: Self-pay | Admitting: Family Medicine

## 2022-05-26 VITALS — BP 136/70 | HR 72 | Temp 98.3°F | Ht 67.0 in | Wt 220.0 lb

## 2022-05-26 DIAGNOSIS — E785 Hyperlipidemia, unspecified: Secondary | ICD-10-CM

## 2022-05-26 DIAGNOSIS — I10 Essential (primary) hypertension: Secondary | ICD-10-CM | POA: Diagnosis not present

## 2022-05-26 DIAGNOSIS — N1832 Chronic kidney disease, stage 3b: Secondary | ICD-10-CM

## 2022-05-26 DIAGNOSIS — E1022 Type 1 diabetes mellitus with diabetic chronic kidney disease: Secondary | ICD-10-CM | POA: Diagnosis not present

## 2022-05-26 NOTE — Progress Notes (Signed)
Established Patient Office Visit   Subjective:  Patient ID: John Fry, male    DOB: 10-Jul-1940  Age: 81 y.o. MRN: [id number]  Chief Complaint  Patient presents with   Follow-up    1 month follow up, no concerns. Patient not fasting.     HPI Encounter Diagnoses  Name Primary?   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  Lab work looked good.  LDL cholesterol well-controlled.  Diabetes controlled with hemoglobin A1c at 7.1.  GFR has been stable at 35.  Blood pressure through his pharmacy has been comparable to today.  Continues on his current regimen as indicated in the note.   Review of Systems  Constitutional: Negative.   HENT: Negative.    Eyes:  Negative for blurred vision, discharge and redness.  Respiratory: Negative.    Cardiovascular: Negative.   Gastrointestinal:  Negative for abdominal pain.  Genitourinary: Negative.   Musculoskeletal: Negative.  Negative for myalgias.  Skin:  Negative for rash.  Neurological:  Negative for tingling, loss of consciousness and weakness.  Endo/Heme/Allergies:  Negative for polydipsia.     Current Outpatient Medications:    Alcohol Swabs (B-D SINGLE USE SWABS REGULAR) PADS, USE  DAILY, Disp: 200 each, Rfl: 0   amLODipine (NORVASC) 10 MG tablet, TAKE 1 TABLET EVERY DAY, Disp: 90 tablet, Rfl: 3   atorvastatin (LIPITOR) 40 MG tablet, TAKE 1 TABLET EVERY DAY, Disp: 90 tablet, Rfl: 3   Blood Glucose Monitoring Suppl (TRUE METRIX METER) w/Device KIT, USE AS DIRECTED, Disp: 1 kit, Rfl: 0   fluticasone (FLONASE) 50 MCG/ACT nasal spray, Place 1 spray into both nostrils daily., Disp: 16 g, Rfl: 2   hydrALAZINE (APRESOLINE) 50 MG tablet, TAKE 1 TABLET THREE TIMES DAILY, Disp: 270 tablet, Rfl: 3   hydrochlorothiazide (HYDRODIURIL) 25 MG tablet, TAKE 1 TABLET EVERY DAY,  Disp: 90 tablet, Rfl: 0   insulin NPH Human (NOVOLIN N) 100 UNIT/ML injection, Inject 0.3 mLs (30 Units total) into the skin at bedtime., Disp: 20 mL, Rfl: 11   insulin regular (HUMULIN R) 100 units/mL injection, Inject 0.25 mLs (25 Units total) into the skin 2 (two) times daily before a meal., Disp: 80 mL, Rfl: 11   lamoTRIgine (LAMICTAL) 25 MG tablet, Take 2 tablets (50 mg total) by mouth 2 (two) times daily., Disp: 360 tablet, Rfl: 4   Semaglutide,0.25 or 0.5MG/DOS, (OZEMPIC, 0.25 OR 0.5 MG/DOSE,) 2 MG/1.5ML SOPN, Inject 0.25 mg into the skin once a week., Disp: 3 mL, Rfl: 3   sildenafil (VIAGRA) 100 MG tablet, Take 100 mg by mouth daily as needed., Disp: , Rfl:    TRUE METRIX BLOOD GLUCOSE TEST test strip, CHECK BLOOD SUGAR TWICE DAILY (APPOINTMENT IS NEEDED FOR FURTHER REFILLS), Disp: 200 strip, Rfl: 0   Objective:     BP 136/70 (BP Location: Left Arm, Patient Position: Sitting, Cuff Size: Large)   Pulse 72   Temp 98.3 F (36.8 C) (Temporal)   Ht _0  (1.702 m)   Wt 220 lb (99.8 kg)   SpO2 97%   BMI 34.46 kg/m    Physical Exam Constitutional:      General: He is not in acute distress.    Appearance: Normal appearance. He is not ill-appearing, toxic-appearing or diaphoretic.  HENT:     Head: Normocephalic and  atraumatic.     Right Ear: External ear normal.     Left Ear: External ear normal.  Eyes:     General: No scleral icterus.       Right eye: No discharge.        Left eye: No discharge.     Extraocular Movements: Extraocular movements intact.     Conjunctiva/sclera: Conjunctivae normal.  Cardiovascular:     Rate and Rhythm: Normal rate and regular rhythm.  Pulmonary:     Effort: Pulmonary effort is normal. No respiratory distress.     Breath sounds: Normal breath sounds.  Skin:    General: Skin is warm and dry.  Neurological:     Mental Status: He is alert and oriented to person, place, and time.  Psychiatric:        Mood and Affect: Mood normal.         Behavior: Behavior normal.      No results found for any visits on 05/26/22.    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  Hypertension, dyslipidemia, CKD and type 1 diabetes currently stable on present management.  Referral to endocrinology for follow-up of type I.  Libby Maw, MD

## 2022-07-06 ENCOUNTER — Other Ambulatory Visit: Payer: Self-pay | Admitting: Family Medicine

## 2022-07-14 ENCOUNTER — Telehealth: Payer: Self-pay

## 2022-07-14 NOTE — Patient Outreach (Signed)
  Care Coordination   07/14/2022 Name: John Fry MRN: [id number] DOB: September 06, 1940   Care Coordination Outreach Attempts:  An unsuccessful telephone outreach was attempted today to offer the patient information about available care coordination services as a benefit of their health plan.  Patient hung up.  Follow Up Plan:  Additional outreach attempts will be made to offer the patient care coordination information and services.   Encounter Outcome:  No Answer   Care Coordination Interventions:  No, not indicated    [name], RN, MSN [facility] Care Management Coordinator Direct Line [phone]

## 2022-07-15 ENCOUNTER — Ambulatory Visit (INDEPENDENT_AMBULATORY_CARE_PROVIDER_SITE_OTHER): Payer: Medicare HMO

## 2022-07-15 VITALS — Ht 67.0 in | Wt 210.0 lb

## 2022-07-15 DIAGNOSIS — Z0000 Encounter for general adult medical examination without abnormal findings: Secondary | ICD-10-CM

## 2022-07-15 NOTE — Patient Instructions (Signed)
John Fry , Thank you for taking time to come for your Medicare Wellness Visit. I appreciate your ongoing commitment to your health goals. Please review the following plan we discussed and let me know if I can assist you in the future.   These are the goals we discussed:  Goals      Patient Stated     Maintain current health & current level of activity     Patient Stated     07/15/2022, no goals        This is a list of the screening recommended for you and due dates:  Health Maintenance  Topic Date Due   Complete foot exam   10/28/2021   Eye exam for diabetics  07/29/2022   Yearly kidney health urinalysis for diabetes  10/09/2022   Hemoglobin A1C  10/25/2022   Yearly kidney function blood test for diabetes  04/27/2023   Medicare Annual Wellness Visit  07/16/2023   DTaP/Tdap/Td vaccine (2 - Td or Tdap) 07/10/2024   Pneumonia Vaccine  Completed   Flu Shot  Completed   HPV Vaccine  Aged Out   Colon Cancer Screening  Discontinued   COVID-19 Vaccine  Discontinued   Zoster (Shingles) Vaccine  Discontinued    Advanced directives: Advance directive discussed with you today. .  Conditions/risks identified: none  Next appointment: Follow up in one year for your annual wellness visit.   Preventive Care 5 Years and Older, Male  Preventive care refers to lifestyle choices and visits with your health care provider that can promote health and wellness. What does preventive care include? A yearly physical exam. This is also called an annual well check. Dental exams once or twice a year. Routine eye exams. Ask your health care provider how often you should have your eyes checked. Personal lifestyle choices, including: Daily care of your teeth and gums. Regular physical activity. Eating a healthy diet. Avoiding tobacco and drug use. Limiting alcohol use. Practicing safe sex. Taking low doses of aspirin every day. Taking vitamin and mineral supplements as recommended by your  health care provider. What happens during an annual well check? The services and screenings done by your health care provider during your annual well check will depend on your age, overall health, lifestyle risk factors, and family history of disease. Counseling  Your health care provider may ask you questions about your: Alcohol use. Tobacco use. Drug use. Emotional well-being. Home and relationship well-being. Sexual activity. Eating habits. History of falls. Memory and ability to understand (cognition). Work and work Statistician. Screening  You may have the following tests or measurements: Height, weight, and BMI. Blood pressure. Lipid and cholesterol levels. These may be checked every 5 years, or more frequently if you are over 64 years old. Skin check. Lung cancer screening. You may have this screening every year starting at age 74 if you have a 30-pack-year history of smoking and currently smoke or have quit within the past 15 years. Fecal occult blood test (FOBT) of the stool. You may have this test every year starting at age 66. Flexible sigmoidoscopy or colonoscopy. You may have a sigmoidoscopy every 5 years or a colonoscopy every 10 years starting at age 45. Prostate cancer screening. Recommendations will vary depending on your family history and other risks. Hepatitis C blood test. Hepatitis B blood test. Sexually transmitted disease (STD) testing. Diabetes screening. This is done by checking your blood sugar (glucose) after you have not eaten for a while (fasting). You may have  this done every 1-3 years. Abdominal aortic aneurysm (AAA) screening. You may need this if you are a current or former smoker. Osteoporosis. You may be screened starting at age 10 if you are at high risk. Talk with your health care provider about your test results, treatment options, and if necessary, the need for more tests. Vaccines  Your health care provider may recommend certain vaccines, such  as: Influenza vaccine. This is recommended every year. Tetanus, diphtheria, and acellular pertussis (Tdap, Td) vaccine. You may need a Td booster every 10 years. Zoster vaccine. You may need this after age 28. Pneumococcal 13-valent conjugate (PCV13) vaccine. One dose is recommended after age 2. Pneumococcal polysaccharide (PPSV23) vaccine. One dose is recommended after age 40. Talk to your health care provider about which screenings and vaccines you need and how often you need them. This information is not intended to replace advice given to you by your health care provider. Make sure you discuss any questions you have with your health care provider. Document Released: 07/03/2015 Document Revised: 02/24/2016 Document Reviewed: 04/07/2015 Elsevier Interactive Patient Education  2017 [place] Prevention in the Home Falls can cause injuries. They can happen to people of all ages. There are many things you can do to make your home safe and to help prevent falls. What can I do on the outside of my home? Regularly fix the edges of walkways and driveways and fix any cracks. Remove anything that might make you trip as you walk through a door, such as a raised step or threshold. Trim any bushes or trees on the path to your home. Use bright outdoor lighting. Clear any walking paths of anything that might make someone trip, such as rocks or tools. Regularly check to see if handrails are loose or broken. Make sure that both sides of any steps have handrails. Any raised decks and porches should have guardrails on the edges. Have any leaves, snow, or ice cleared regularly. Use sand or salt on walking paths during winter. Clean up any spills in your garage right away. This includes oil or grease spills. What can I do in the bathroom? Use night lights. Install grab bars by the toilet and in the tub and shower. Do not use towel bars as grab bars. Use non-skid mats or decals in the tub or  shower. If you need to sit down in the shower, use a plastic, non-slip stool. Keep the floor dry. Clean up any water that spills on the floor as soon as it happens. Remove soap buildup in the tub or shower regularly. Attach bath mats securely with double-sided non-slip rug tape. Do not have throw rugs and other things on the floor that can make you trip. What can I do in the bedroom? Use night lights. Make sure that you have a light by your bed that is easy to reach. Do not use any sheets or blankets that are too big for your bed. They should not hang down onto the floor. Have a firm chair that has side arms. You can use this for support while you get dressed. Do not have throw rugs and other things on the floor that can make you trip. What can I do in the kitchen? Clean up any spills right away. Avoid walking on wet floors. Keep items that you use a lot in easy-to-reach places. If you need to reach something above you, use a strong step stool that has a grab bar. Keep electrical cords out  of the way. Do not use floor polish or wax that makes floors slippery. If you must use wax, use non-skid floor wax. Do not have throw rugs and other things on the floor that can make you trip. What can I do with my stairs? Do not leave any items on the stairs. Make sure that there are handrails on both sides of the stairs and use them. Fix handrails that are broken or loose. Make sure that handrails are as long as the stairways. Check any carpeting to make sure that it is firmly attached to the stairs. Fix any carpet that is loose or worn. Avoid having throw rugs at the top or bottom of the stairs. If you do have throw rugs, attach them to the floor with carpet tape. Make sure that you have a light switch at the top of the stairs and the bottom of the stairs. If you do not have them, ask someone to add them for you. What else can I do to help prevent falls? Wear shoes that: Do not have high heels. Have  rubber bottoms. Are comfortable and fit you well. Are closed at the toe. Do not wear sandals. If you use a stepladder: Make sure that it is fully opened. Do not climb a closed stepladder. Make sure that both sides of the stepladder are locked into place. Ask someone to hold it for you, if possible. Clearly mark and make sure that you can see: Any grab bars or handrails. First and last steps. Where the edge of each step is. Use tools that help you move around (mobility aids) if they are needed. These include: Canes. Walkers. Scooters. Crutches. Turn on the lights when you go into a dark area. Replace any light bulbs as soon as they burn out. Set up your furniture so you have a clear path. Avoid moving your furniture around. If any of your floors are uneven, fix them. If there are any pets around you, be aware of where they are. Review your medicines with your doctor. Some medicines can make you feel dizzy. This can increase your chance of falling. Ask your doctor what other things that you can do to help prevent falls. This information is not intended to replace advice given to you by your health care provider. Make sure you discuss any questions you have with your health care provider. Document Released: 04/02/2009 Document Revised: 11/12/2015 Document Reviewed: 07/11/2014 Elsevier Interactive Patient Education  2017 [employer].

## 2022-07-15 NOTE — Progress Notes (Signed)
I connected with John Fry today by telephone and verified that I am speaking with the correct person using two identifiers. Location patient: home Location provider: work Persons participating in the virtual visit: John Fry, Glenna Durand LPN.   I discussed the limitations, risks, security and privacy concerns of performing an evaluation and management service by telephone and the availability of in person appointments. I also discussed with the patient that there may be a patient responsible charge related to this service. The patient expressed understanding and verbally consented to this telephonic visit.    Interactive audio and video telecommunications were attempted between this provider and patient, however failed, due to patient having technical difficulties OR patient did not have access to video capability.  We continued and completed visit with audio only.     Vital signs may be patient reported or missing.  Subjective:   John Fry is a 82 y.o. male who presents for Medicare Annual/Subsequent preventive examination.  Review of Systems     Cardiac Risk Factors include: advanced age (>49mn, >>2women);diabetes mellitus;dyslipidemia;hypertension;male gender;obesity (BMI >30kg/m2)     Objective:    Today's Vitals   07/15/22 0901  Weight: 210 lb (95.3 kg)  Height: '5\' 7"'$  (1.702 m)   Body mass index is 32.89 kg/m.     07/15/2022    9:07 AM 07/13/2021    2:21 PM 04/16/2020    9:52 AM 04/06/2018   10:10 AM 03/15/2017   11:27 AM 09/30/2015    9:26 AM 09/11/2014    9:10 AM  Advanced Directives  Does Patient Have a Medical Advance Directive? No No Yes No No No No  Type of Advance Directive   HDufurin Chart?   No - copy requested      Would patient like information on creating a medical advance directive?  No - Patient declined     No - patient declined information    Current  Medications (verified) Outpatient Encounter Medications as of 07/15/2022  Medication Sig   Alcohol Swabs (B-D SINGLE USE SWABS REGULAR) PADS USE  DAILY   amLODipine (NORVASC) 10 MG tablet TAKE 1 TABLET EVERY DAY   atorvastatin (LIPITOR) 40 MG tablet TAKE 1 TABLET EVERY DAY   Blood Glucose Monitoring Suppl (TRUE METRIX METER) w/Device KIT USE AS DIRECTED   fluticasone (FLONASE) 50 MCG/ACT nasal spray Place 1 spray into both nostrils daily.   hydrALAZINE (APRESOLINE) 50 MG tablet TAKE 1 TABLET THREE TIMES DAILY   hydrochlorothiazide (HYDRODIURIL) 25 MG tablet TAKE 1 TABLET EVERY DAY   insulin NPH Human (NOVOLIN N) 100 UNIT/ML injection Inject 0.3 mLs (30 Units total) into the skin at bedtime.   insulin regular (HUMULIN R) 100 units/mL injection Inject 0.25 mLs (25 Units total) into the skin 2 (two) times daily before a meal.   lamoTRIgine (LAMICTAL) 25 MG tablet Take 2 tablets (50 mg total) by mouth 2 (two) times daily.   sildenafil (VIAGRA) 100 MG tablet Take 100 mg by mouth daily as needed.   TRUE METRIX BLOOD GLUCOSE TEST test strip CHECK BLOOD SUGAR TWICE DAILY (APPOINTMENT IS NEEDED FOR FURTHER REFILLS)   Semaglutide,0.25 or 0.'5MG'$ /DOS, (OZEMPIC, 0.25 OR 0.5 MG/DOSE,) 2 MG/1.5ML SOPN Inject 0.25 mg into the skin once a week. (Patient not taking: Reported on 07/15/2022)   No facility-administered encounter medications on file as of 07/15/2022.    Allergies (verified) Zestril [lisinopril]   History: Past Medical  History:  Diagnosis Date   Diabetes mellitus without complication ([place])    DYSLIPIDEMIA 1/74/9449   Helicobacter pylori (H. pylori) 09/25/2013   HERNIA 03/05/2007   HYPERTENSION 03/05/2007   Prostate cancer ([place]) 07/23/13   Gleason 7, volume 53 ml   RENAL INSUFFICIENCY 05/22/2008   S/P radiation therapy 04/14/2014 through 06/11/2014                                                      Prostate 7800 cGy in 40 sessions, seminal vesicles 5600 cGy in 40 sessions                          SHOULDER PAIN, RIGHT 05/08/2008   VENTRICULAR HYPERTROPHY, LEFT 05/08/2008   Past Surgical History:  Procedure Laterality Date   FOOT SURGERY  1999   Right Foot Bunion Repair   Lower Arterial  05/06/2003   PROSTATE BIOPSY  07/23/13   gleason 7, volume 53 ml   remove fatty tumor Left 1995   thigh   Family History  Problem Relation Age of Onset   Cancer Father 83       prostate   Kidney Stones Brother    Diabetes Sister    Hypertension Neg Hx    Colon cancer Neg Hx    Social History   Socioeconomic History   Marital status: Widowed    Spouse name: Not on file   Number of children: 2   Years of education: Not on file   Highest education level: Not on file  Occupational History    Comment: Retired  Tobacco Use   Smoking status: Former    Packs/day: 0.50    Years: 20.00    Total pack years: 10.00    Types: Cigarettes    Quit date: 06/20/2006    Years since quitting: 16.0   Smokeless tobacco: Never  Substance and Sexual Activity   Alcohol use: No    Alcohol/week: 0.0 standard drinks of alcohol    Comment: quit 17 years ago   Drug use: No   Sexual activity: Not Currently  Other Topics Concern   Not on file  Social History Narrative   Lives alone   Social Determinants of Health   Financial Resource Strain: Low Risk  (07/15/2022)   Overall Financial Resource Strain (CARDIA)    Difficulty of Paying Living Expenses: Not hard at all  Food Insecurity: No Food Insecurity (07/15/2022)   Hunger Vital Sign    Worried About Running Out of Food in the Last Year: Never true    [place] in the Last Year: Never true  Transportation Needs: No Transportation Needs (07/15/2022)   PRAPARE - Hydrologist (Medical): No    Lack of Transportation (Non-Medical): No  Physical Activity: Inactive (07/15/2022)   Exercise Vital Sign    Days of Exercise per Week: 0 days    Minutes of Exercise per Session: 0 min  Stress: No Stress Concern Present (07/15/2022)    [place]    Feeling of Stress : Not at all  Social Connections: Moderately Isolated (07/13/2021)   Social Connection and Isolation Panel [NHANES]    Frequency of Communication with Friends and Family: Three times a week  Frequency of Social Gatherings with Friends and Family: Three times a week    Attends Religious Services: More than 4 times per year    Active Member of Clubs or Organizations: No    Attends Archivist Meetings: Never    Marital Status: Widowed    Tobacco Counseling Counseling given: Not Answered   Clinical Intake:  Pre-visit preparation completed: Yes  Pain : No/denies pain     Nutritional Status: BMI > 30  Obese Nutritional Risks: None Diabetes: Yes  How often do you need to have someone help you when you read instructions, pamphlets, or other written materials from your doctor or pharmacy?: 1 - Never  Diabetic? Yes Nutrition Risk Assessment:  Has the patient had any N/V/D within the last 2 months?  No  Does the patient have any non-healing wounds?  No  Has the patient had any unintentional weight loss or weight gain?  No   Diabetes:  Is the patient diabetic?  Yes  If diabetic, was a CBG obtained today?  No  Did the patient bring in their glucometer from home?  No  How often do you monitor your CBG's? Twice daily.   Financial Strains and Diabetes Management:  Are you having any financial strains with the device, your supplies or your medication? No .  Does the patient want to be seen by Chronic Care Management for management of their diabetes?  No  Would the patient like to be referred to a Nutritionist or for Diabetic Management?  No   Diabetic Exams:  Diabetic Eye Exam: Completed 07/29/2021 Diabetic Foot Exam: Overdue, Pt has been advised about the importance in completing this exam. Pt is scheduled for diabetic foot exam on next appointment.   Interpreter  Needed?: No  Information entered by :: NAllen LPN   Activities of Daily Living    07/15/2022    9:08 AM  In your present state of health, do you have any difficulty performing the following activities:  Hearing? 0  Vision? 0  Difficulty concentrating or making decisions? 0  Walking or climbing stairs? 0  Dressing or bathing? 0  Doing errands, shopping? 0  Preparing Food and eating ? N  Using the Toilet? N  In the past six months, have you accidently leaked urine? Y  Do you have problems with loss of bowel control? N  Managing your Medications? N  Managing your Finances? N  Housekeeping or managing your Housekeeping? N    Patient Care Team: Libby Maw, MD as PCP - General (Family Medicine) Arloa Koh, MD (Inactive) as Consulting Physician (Radiation Oncology) Marylynn Pearson, MD as Consulting Physician (Ophthalmology)  Indicate any recent Medical Services you may have received from other than [facility] providers in the past year (date may be approximate).     Assessment:   This is a routine wellness examination for John Fry.  Hearing/Vision screen Vision Screening - Comments:: Regular eye exams, Dr. Venetia Maxon  Dietary issues and exercise activities discussed: Current Exercise Habits: The patient does not participate in regular exercise at present   Goals Addressed             This Visit's Progress    Patient Stated       07/15/2022, no goals       Depression Screen    07/15/2022    9:08 AM 05/26/2022   10:30 AM 04/26/2022    1:25 PM 10/08/2021    4:22 PM 07/13/2021    2:24 PM 07/13/2021  2:19 PM 09/10/2020   10:57 AM  PHQ 2/9 Scores  PHQ - 2 Score 0 0 0 0 0 0 0    Fall Risk    07/15/2022    9:07 AM 05/26/2022   10:30 AM 04/26/2022    1:25 PM 10/08/2021    4:22 PM 09/21/2021    2:45 PM  Fall Risk   Falls in the past year? 0 0 0 0 0  Number falls in past yr: 0 0 0 0   Injury with Fall? 0 0 0    Risk for fall due to : Medication side effect No  Fall Risks     Follow up Falls prevention discussed;Education provided;Falls evaluation completed Falls evaluation completed       FALL RISK PREVENTION PERTAINING TO THE HOME:  Any stairs in or around the home? No  If so, are there any without handrails? N/a Home free of loose throw rugs in walkways, pet beds, electrical cords, etc? Yes  Adequate lighting in your home to reduce risk of falls? Yes   ASSISTIVE DEVICES UTILIZED TO PREVENT FALLS:  Life alert? No  Use of a cane, walker or w/c? No  Grab bars in the bathroom? No  Shower chair or bench in shower? No  Elevated toilet seat or a handicapped toilet? No   TIMED UP AND GO:  Was the test performed? No .      Cognitive Function:        07/15/2022    9:09 AM  6CIT Screen  What Year? 0 points  What month? 0 points  What time? 0 points  Count back from 20 0 points  Months in reverse 2 points  Repeat phrase 4 points  Total Score 6 points    Immunizations Immunization History  Administered Date(s) Administered   Fluad Quad(high Dose 65+) 03/08/2019, 03/25/2020   Influenza Whole 05/08/2008   Influenza, High Dose Seasonal PF 03/15/2017, 04/06/2018, 04/26/2022   Influenza,inj,Quad PF,6+ Mos 05/01/2015   Moderna Sars-Covid-2 Vaccination 08/28/2019, 09/27/2019, 05/04/2020   Pneumococcal Conjugate-13 07/10/2014   Pneumococcal Polysaccharide-23 05/08/2008   Tdap 07/10/2014    TDAP status: Up to date  Flu Vaccine status: Up to date  Pneumococcal vaccine status: Up to date  Covid-19 vaccine status: Completed vaccines  Qualifies for Shingles Vaccine? Yes   Zostavax completed No   Shingrix Completed?: No.    Education has been provided regarding the importance of this vaccine. Patient has been advised to call insurance company to determine out of pocket expense if they have not yet received this vaccine. Advised may also receive vaccine at local pharmacy or Health Dept. Verbalized acceptance and  understanding.  Screening Tests Health Maintenance  Topic Date Due   FOOT EXAM  10/28/2021   Medicare Annual Wellness (AWV)  07/13/2022   OPHTHALMOLOGY EXAM  07/29/2022   Diabetic kidney evaluation - Urine ACR  10/09/2022   HEMOGLOBIN A1C  10/25/2022   Diabetic kidney evaluation - eGFR measurement  04/27/2023   DTaP/Tdap/Td (2 - Td or Tdap) 07/10/2024   Pneumonia Vaccine 69+ Years old  Completed   INFLUENZA VACCINE  Completed   HPV VACCINES  Aged Out   COLONOSCOPY (Pts 45-58yr Insurance coverage will need to be confirmed)  Discontinued   COVID-19 Vaccine  Discontinued   Zoster Vaccines- Shingrix  Discontinued    Health Maintenance  Health Maintenance Due  Topic Date Due   FOOT EXAM  10/28/2021   Medicare Annual Wellness (AWV)  07/13/2022    Colorectal  cancer screening: No longer required.   Lung Cancer Screening: (Low Dose CT Chest recommended if Age 35-80 years, 30 pack-year currently smoking OR have quit w/in 15years.) does not qualify.   Lung Cancer Screening Referral: no  Additional Screening:  Hepatitis C Screening: does not qualify;   Vision Screening: Recommended annual ophthalmology exams for early detection of glaucoma and other disorders of the eye. Is the patient up to date with their annual eye exam?  No  Who is the provider or what is the name of the office in which the patient attends annual eye exams? Dr. Venetia Maxon If pt is not established with a provider, would they like to be referred to a provider to establish care? No .   Dental Screening: Recommended annual dental exams for proper oral hygiene  Community Resource Referral / Chronic Care Management: CRR required this visit?  No   CCM required this visit?  No      Plan:     I have personally reviewed and noted the following in the patient's chart:   Medical and social history Use of alcohol, tobacco or illicit drugs  Current medications and supplements including opioid prescriptions.  Patient is not currently taking opioid prescriptions. Functional ability and status Nutritional status Physical activity Advanced directives List of other physicians Hospitalizations, surgeries, and ER visits in previous 12 months Vitals Screenings to include cognitive, depression, and falls Referrals and appointments  In addition, I have reviewed and discussed with patient certain preventive protocols, quality metrics, and best practice recommendations. A written personalized care plan for preventive services as well as general preventive health recommendations were provided to patient.     Kellie Simmering, LPN   4/96/7591   Nurse Notes: none  Due to this being a virtual visit, the after visit summary with patients personalized plan was offered to patient via mail or my-chart.  to pick up at office at next visit

## 2022-07-25 ENCOUNTER — Other Ambulatory Visit: Payer: Self-pay | Admitting: Family Medicine

## 2022-08-02 DIAGNOSIS — H401131 Primary open-angle glaucoma, bilateral, mild stage: Secondary | ICD-10-CM | POA: Diagnosis not present

## 2022-08-04 DIAGNOSIS — N183 Chronic kidney disease, stage 3 unspecified: Secondary | ICD-10-CM | POA: Diagnosis not present

## 2022-08-09 ENCOUNTER — Telehealth: Payer: Self-pay

## 2022-08-09 DIAGNOSIS — C61 Malignant neoplasm of prostate: Secondary | ICD-10-CM | POA: Diagnosis not present

## 2022-08-09 DIAGNOSIS — N1832 Chronic kidney disease, stage 3b: Secondary | ICD-10-CM | POA: Diagnosis not present

## 2022-08-09 DIAGNOSIS — E1122 Type 2 diabetes mellitus with diabetic chronic kidney disease: Secondary | ICD-10-CM | POA: Diagnosis not present

## 2022-08-09 DIAGNOSIS — I129 Hypertensive chronic kidney disease with stage 1 through stage 4 chronic kidney disease, or unspecified chronic kidney disease: Secondary | ICD-10-CM | POA: Diagnosis not present

## 2022-08-09 DIAGNOSIS — N281 Cyst of kidney, acquired: Secondary | ICD-10-CM | POA: Diagnosis not present

## 2022-08-09 DIAGNOSIS — E669 Obesity, unspecified: Secondary | ICD-10-CM | POA: Diagnosis not present

## 2022-08-09 NOTE — Patient Outreach (Signed)
  Care Coordination   08/09/2022 Name: TIMOTEO DELORBE MRN: [id number] DOB: 18-Mar-1941   Care Coordination Outreach Attempts:  A second unsuccessful outreach was attempted today to offer the patient with information about available care coordination services as a benefit of their health plan.     Follow Up Plan:  Additional outreach attempts will be made to offer the patient care coordination information and services.   Encounter Outcome:  No Answer   Care Coordination Interventions:  No, not indicated    [name], RN, MSN [facility] Care Management Coordinator Direct Line [phone]

## 2022-08-15 ENCOUNTER — Encounter: Payer: Self-pay | Admitting: Internal Medicine

## 2022-08-15 ENCOUNTER — Ambulatory Visit: Payer: Medicare HMO | Admitting: Internal Medicine

## 2022-08-15 DIAGNOSIS — E1022 Type 1 diabetes mellitus with diabetic chronic kidney disease: Secondary | ICD-10-CM | POA: Diagnosis not present

## 2022-08-15 DIAGNOSIS — N184 Chronic kidney disease, stage 4 (severe): Secondary | ICD-10-CM | POA: Diagnosis not present

## 2022-08-15 LAB — POCT GLYCOSYLATED HEMOGLOBIN (HGB A1C): Hemoglobin A1C: 8.2 % — AB (ref 4.0–5.6)

## 2022-08-15 MED ORDER — INSULIN NPH (HUMAN) (ISOPHANE) 100 UNIT/ML ~~LOC~~ SUSP: 25.0000 [IU] | Freq: Every day | SUBCUTANEOUS | 11 refills | Status: AC

## 2022-08-15 MED ORDER — OZEMPIC (0.25 OR 0.5 MG/DOSE) 2 MG/1.5ML ~~LOC~~ SOPN: 0.5000 mg | PEN_INJECTOR | SUBCUTANEOUS | 3 refills | Status: DC

## 2022-08-15 NOTE — Patient Instructions (Addendum)
Please reduce - NPH 25 units at bedtime - R insulin 20-25 units 2x a day 30 minutes before meals  Please start: - Ozempic 0.25 mg weekly in a.m. (for example on Sunday morning) x 2 weeks, then increase to 0.5 mg weekly in a.m. if no nausea or hypoglycemia.  Please return for another visit in 2-3 months.   PATIENT INSTRUCTIONS FOR TYPE 2 DIABETES:  **Please join MyChart!** - see attached instructions about how to join if you have not done so already.  DIET AND EXERCISE Diet and exercise is an important part of diabetic treatment.  We recommended aerobic exercise in the form of brisk walking (working between 40-60% of maximal aerobic capacity, similar to brisk walking) for 150 minutes per week (such as 30 minutes five days per week) along with 3 times per week performing 'resistance' training (using various gauge rubber tubes with handles) 5-10 exercises involving the major muscle groups (upper body, lower body and core) performing 10-15 repetitions (or near fatigue) each exercise. Start at half the above goal but build slowly to reach the above goals. If limited by weight, joint pain, or disability, we recommend daily walking in a swimming pool with water up to waist to reduce pressure from joints while allow for adequate exercise.    BLOOD GLUCOSES Monitoring your blood glucoses is important for continued management of your diabetes. Please check your blood glucoses 2-4 times a day: fasting, before meals and at bedtime (you can rotate these measurements - e.g. one day check before the 3 meals, the next day check before 2 of the meals and before bedtime, etc.).   HYPOGLYCEMIA (low blood sugar) Hypoglycemia is usually a reaction to not eating, exercising, or taking too much insulin/ other diabetes drugs.  Symptoms include tremors, sweating, hunger, confusion, headache, etc. Treat IMMEDIATELY with 15 grams of Carbs: 4 glucose tablets  cup regular juice/soda 2 tablespoons raisins 4 teaspoons  sugar 1 tablespoon honey Recheck blood glucose in 15 mins and repeat above if still symptomatic/blood glucose <100.  RECOMMENDATIONS TO REDUCE YOUR RISK OF DIABETIC COMPLICATIONS: * Take your prescribed MEDICATION(S) * Follow a DIABETIC diet: Complex carbs, fiber rich foods, (monounsaturated and polyunsaturated) fats * AVOID saturated/trans fats, high fat foods, >2,300 mg salt per day. * EXERCISE at least 5 times a week for 30 minutes or preferably daily.  * DO NOT SMOKE OR DRINK more than 1 drink a day. * Check your FEET every day. Do not wear tightfitting shoes. Contact us if you develop an ulcer * See your EYE doctor once a year or more if needed * Get a FLU shot once a year * Get a PNEUMONIA vaccine once before and once after age 69 years  GOALS:  * Your Hemoglobin A1c of <7%  * fasting sugars need to be 80-130 * after meals sugars need to be <180 (2h after you start eating) * Your Systolic BP should be [id number] or lower  * Your Diastolic BP should be 80 or lower  * Your HDL (Good Cholesterol) should be 40 or higher  * Your LDL (Bad Cholesterol) should be ideally <70. * Your Triglycerides should be 150 or lower  * Your Urine microalbumin (kidney function) should be <30 * Your Body Mass Index should be 25 or lower   Please consider the following ways to cut down carbs and fat and increase fiber and micronutrients in your diet: - substitute whole grain for white bread or pasta - substitute brown rice for white rice -  substitute 90-calorie flat bread pieces for slices of bread when possible - substitute sweet potatoes or yams for white potatoes - substitute humus for margarine - substitute tofu for cheese when possible - substitute almond or rice milk for regular milk (would not drink soy milk daily due to concern for soy estrogen influence on breast cancer risk) - substitute dark chocolate for other sweets when possible - substitute water - can add lemon or orange slices for taste -  for diet sodas (artificial sweeteners will trick your body that you can eat sweets without getting calories and will lead you to overeating and weight gain in the long run) - do not skip breakfast or other meals (this will slow down the metabolism and will result in more weight gain over time)  - can try smoothies made from fruit and almond/rice milk in am instead of regular breakfast - can also try old-fashioned (not instant) oatmeal made with almond/rice milk in am - order the dressing on the side when eating salad at a restaurant (pour less than half of the dressing on the salad) - eat as little meat as possible - can try juicing, but should not forget that juicing will get rid of the fiber, so would alternate with eating raw veg./fruits or drinking smoothies - use as little oil as possible, even when using olive oil - can dress a salad with a mix of balsamic vinegar and lemon juice, for e.g. - use agave nectar, stevia sugar, or regular sugar rather than artificial sweateners - steam or broil/roast veggies  - snack on veggies/fruit/nuts (unsalted, preferably) when possible, rather than processed foods - reduce or eliminate aspartame in diet (it is in diet sodas, chewing gum, etc) Read the labels!  Try to read Dr. Janene Harvey book: "Program for Reversing Diabetes" for other ideas for healthy eating.

## 2022-08-15 NOTE — Progress Notes (Signed)
Patient ID: John Fry, male   DOB: 05/10/1941, 82 y.o.   MRN: [id number]  HPI: John Fry is a 82 y.o.-year-old male, returning for follow-up for DM2, dx in 2015, insulin-dependent, uncontrolled, with complications (CKD). Pt. previously saw Dr. Loanne Drilling, last visit 82 year and 9 mo ago.  He tells me he has been seeing Dr. Loanne Drilling for more than 20 years.  Reviewed HbA1c: Lab Results  Component Value Date   HGBA1C 7.1 (H) 04/26/2022   HGBA1C 8.3 (A) 10/28/2020   HGBA1C 8.9 (A) 08/26/2020   HGBA1C 8.6 (A) 06/25/2020   HGBA1C 8.1 (A) 03/25/2020   HGBA1C 7.2 (A) 11/12/2019   HGBA1C 8.1 (A) 09/09/2019   HGBA1C 8.4 (A) 07/10/2019   HGBA1C 7.4 (A) 03/08/2019   HGBA1C 7.2 (A) 11/07/2018   Pt is on a regimen of: (vials) - NPH 30 units at bedtime - R insulin 25-30 units twice a day 20-30 min before meals - Ozempic 0.25 mg weekly - ran out 3.5 mo ago and was not able to refill  Pt checks his sugars 2x a day and they are: - am: 104-115 - 2h after b'fast: n/c - before lunch: n/c - 2h after lunch: n/c - before dinner: 105-110 - 2h after dinner: 130 - bedtime: n/c - nighttime: 95-110 Lowest sugar was 85 (when active in the yard); he has hypoglycemia awareness at 80s.  Highest sugar was 200 - not recently.  Glucometer: True Metrix  Pt's meals are: - Breakfast: 1-2 eggs + bacon + toast, milk - Lunch: sandwich, PB crackers - Dinner: meat + starch + veggie, but a lot of times: just veggies - Snacks:  - + CKD 3-4 - sees [facility], last BUN/creatinine:  Lab Results  Component Value Date   BUN 23 04/26/2022   BUN 29 (H) 10/08/2021   CREATININE 1.70 (H) 04/26/2022   CREATININE 2.64 (H) 10/08/2021  He is not on ACE inhibitor/ARB.  He developed angioedema from lisinopril.  -+ HL; last set of lipids: Lab Results  Component Value Date   CHOL 128 10/08/2021   HDL 41 10/08/2021   LDLCALC 61 10/08/2021   LDLDIRECT 56.0 04/26/2022   TRIG 181 (H) 10/08/2021   CHOLHDL 3.1  10/08/2021  On Lipitor 40 mg daily.  - last eye exam was in 07/2022. No DR reportedly. + open-angle glaucoma - OS. + cataract - OD  - no numbness and tingling in his feet.  He also has a history of hyperparathyroidism, unclear if related to his kidney dysfunction.  Lab Results  Component Value Date   PTH 63 04/06/2018   PTH 87 (H) 03/15/2017   PTH 107 (H) 11/02/2015   PTH 129 (H) 01/29/2015   PTH 111 (H) 10/09/2014   PTH 109 (H) 04/29/2014   PTH 55.5 10/28/2011   Lab Results  Component Value Date   CALCIUM 10.0 04/26/2022   CALCIUM 9.9 10/08/2021   CALCIUM 10.1 09/10/2020   CALCIUM 9.9 03/13/2020   CALCIUM 9.4 09/11/2019   CALCIUM 10.4 04/06/2018   CALCIUM 10.4 (H) 04/06/2018   CALCIUM 9.5 03/15/2017   CALCIUM 9.8 03/15/2017   CALCIUM 9.7 11/02/2015   CALCIUM 9.8 11/02/2015   Lab Results  Component Value Date   VD25OH 57.14 04/06/2018   VD25OH 50.16 03/15/2017   VD25OH 26.75 (L) 11/02/2015   VD25OH 47.19 01/29/2015   VD25OH 19.92 (L) 10/09/2014   He has a history of HTN.  ROS: + see HPI No increased urination, blurry vision, nausea, chest pain.  Past Medical History:  Diagnosis Date   Diabetes mellitus without complication ([place])    DYSLIPIDEMIA 123XX123   Helicobacter pylori (H. pylori) 09/25/2013   HERNIA 03/05/2007   HYPERTENSION 03/05/2007   Prostate cancer ([place]) 07/23/13   Gleason 7, volume 53 ml   RENAL INSUFFICIENCY 05/22/2008   S/P radiation therapy 04/14/2014 through 06/11/2014                                                      Prostate 7800 cGy in 40 sessions, seminal vesicles 5600 cGy in 40 sessions                         SHOULDER PAIN, RIGHT 05/08/2008   VENTRICULAR HYPERTROPHY, LEFT 05/08/2008   Past Surgical History:  Procedure Laterality Date   FOOT SURGERY  1999   Right Foot Bunion Repair   Lower Arterial  05/06/2003   PROSTATE BIOPSY  07/23/13   gleason 7, volume 53 ml   remove fatty tumor Left 1995   thigh   Social History    Socioeconomic History   Marital status: Widowed    Spouse name: Not on file   Number of children: 2   Years of education: Not on file   Highest education level: Not on file  Occupational History    Comment: Retired  Tobacco Use   Smoking status: Former    Packs/day: 0.50    Years: 20.00    Total pack years: 10.00    Types: Cigarettes    Quit date: 06/20/2006    Years since quitting: 16.1   Smokeless tobacco: Never  Substance and Sexual Activity   Alcohol use: No    Alcohol/week: 0.0 standard drinks of alcohol    Comment: quit 17 years ago   Drug use: No   Sexual activity: Not Currently  Other Topics Concern   Not on file  Social History Narrative   Lives alone   Social Determinants of Health   Financial Resource Strain: Low Risk  (07/15/2022)   Overall Financial Resource Strain (CARDIA)    Difficulty of Paying Living Expenses: Not hard at all  Food Insecurity: No Food Insecurity (07/15/2022)   Hunger Vital Sign    Worried About Running Out of Food in the Last Year: Never true    [place] in the Last Year: Never true  Transportation Needs: No Transportation Needs (07/15/2022)   PRAPARE - Hydrologist (Medical): No    Lack of Transportation (Non-Medical): No  Physical Activity: Inactive (07/15/2022)   Exercise Vital Sign    Days of Exercise per Week: 0 days    Minutes of Exercise per Session: 0 min  Stress: No Stress Concern Present (07/15/2022)   [place]    Feeling of Stress : Not at all  Social Connections: Moderately Isolated (07/13/2021)   Social Connection and Isolation Panel [NHANES]    Frequency of Communication with Friends and Family: Three times a week    Frequency of Social Gatherings with Friends and Family: Three times a week    Attends Religious Services: More than 4 times per year    Active Member of Clubs or Organizations: No    Attends Theatre manager Meetings: Never  Marital Status: Widowed  Intimate Partner Violence: Not At Risk (07/13/2021)   Humiliation, Afraid, Rape, and Kick questionnaire    Fear of Current or Ex-Partner: No    Emotionally Abused: No    Physically Abused: No    Sexually Abused: No   Current Outpatient Medications on File Prior to Visit  Medication Sig Dispense Refill   Alcohol Swabs (B-D SINGLE USE SWABS REGULAR) PADS USE  DAILY 200 each 0   amLODipine (NORVASC) 10 MG tablet TAKE 1 TABLET EVERY DAY 90 tablet 3   atorvastatin (LIPITOR) 40 MG tablet TAKE 1 TABLET EVERY DAY 90 tablet 3   Blood Glucose Monitoring Suppl (TRUE METRIX METER) w/Device KIT USE AS DIRECTED 1 kit 0   fluticasone (FLONASE) 50 MCG/ACT nasal spray Place 1 spray into both nostrils daily. 16 g 2   hydrALAZINE (APRESOLINE) 50 MG tablet TAKE 1 TABLET THREE TIMES DAILY 270 tablet 3   hydrochlorothiazide (HYDRODIURIL) 25 MG tablet TAKE 1 TABLET EVERY DAY 90 tablet 3   insulin NPH Human (NOVOLIN N) 100 UNIT/ML injection Inject 0.3 mLs (30 Units total) into the skin at bedtime. 20 mL 11   insulin regular (HUMULIN R) 100 units/mL injection Inject 0.25 mLs (25 Units total) into the skin 2 (two) times daily before a meal. 80 mL 11   lamoTRIgine (LAMICTAL) 25 MG tablet Take 2 tablets (50 mg total) by mouth 2 (two) times daily. 360 tablet 4   Semaglutide,0.25 or 0.'5MG'$ /DOS, (OZEMPIC, 0.25 OR 0.5 MG/DOSE,) 2 MG/1.5ML SOPN Inject 0.25 mg into the skin once a week. (Patient not taking: Reported on 07/15/2022) 3 mL 3   sildenafil (VIAGRA) 100 MG tablet Take 100 mg by mouth daily as needed.     TRUE METRIX BLOOD GLUCOSE TEST test strip CHECK BLOOD SUGAR TWICE DAILY (APPOINTMENT IS NEEDED FOR FURTHER REFILLS) 200 strip 0   No current facility-administered medications on file prior to visit.   Allergies  Allergen Reactions   Zestril [Lisinopril]     angioedema   Family History  Problem Relation Age of Onset   Cancer Father 58       prostate    Kidney Stones Brother    Diabetes Sister    Hypertension Neg Hx    Colon cancer Neg Hx    PE: BP (!) 140/84 (BP Location: Right Arm, Patient Position: Sitting, Cuff Size: Normal)   Pulse 80   Ht '5\' 7"'$  (1.702 m)   Wt 219 lb (99.3 kg)   SpO2 99%   BMI 34.30 kg/m  Wt Readings from Last 3 Encounters:  08/15/22 219 lb (99.3 kg)  07/15/22 210 lb (95.3 kg)  05/26/22 220 lb (99.8 kg)   Constitutional: overweight, in NAD Eyes:  EOMI, no exophthalmos ENT: no neck masses, no cervical lymphadenopathy Cardiovascular: RRR, No MRG Respiratory: CTA B Musculoskeletal: no deformities Skin:no rashes Neurological: + mild tremor with outstretched hands Diabetic Foot Exam - Simple   Simple Foot Form Diabetic Foot exam was performed with the following findings: Yes 08/15/2022  9:08 AM  Visual Inspection No deformities, no ulcerations, no other skin breakdown bilaterally: Yes Sensation Testing Intact to touch and monofilament testing bilaterally: Yes Pulse Check Posterior Tibialis and Dorsalis pulse intact bilaterally: Yes Comments + Onychodystrophy    ASSESSMENT: 1. DM2, insulin-dependent, uncontrolled, with complications - + CKD  2. HL  PLAN:  1. Patient with long-standing, uncontrolled diabetes, on injectable antidiabetic regimen with basal/bolus insulin and previously on GLP-1 receptor agonist, but now off after running out.  Latest HbA1c was lower, and 7.1% 3 months ago.  At today's visit, HbA1c is 8.2% (higher). -At today's visit, sugars appear to be at goal in the morning and also later in the day, discrepant with the HbA1c.  He mentions that his sugars are dropping during the night to the 90s and he has to eat candy to raise them.  He also drops his blood sugars he is very active during the day, for example when working in his garden.  I advised him to reduce his NPH at bedtime and also reduce his regular insulin before meals while trying to add back Ozempic at the lowest dose for 2  weeks and then increasing to 0.5 mg weekly.  I demonstrated this on the Ozempic demonstration pen, however, we also discussed about possibly changing from NPH/regular insulin to analog insulins at next visit, since these became more affordable lately. -We reviewed when he is taking the insulin send he is taking them correctly, 20 to 30 minutes before meals for the regular insulin and at bedtime for NPH. - I suggested to:  Patient Instructions  Please reduce - NPH 25 units at bedtime - R insulin 20-25 units 2x a day 30 minutes before meals  Please start: - Ozempic 0.25 mg weekly in a.m. (for example on Sunday morning) x 2 weeks, then increase to 0.5 mg weekly in a.m. if no nausea or hypoglycemia.  Please return for another visit in 2-3 months.   - check sugars at different times of the day - check 2x a day, rotating checks - discussed about CBG targets for treatment: 80-130 mg/dL before meals and <180 mg/dL after meals; target HbA1c <7%. - given foot care handout  - given instructions for hypoglycemia management "15-15 rule"  - advised for yearly eye exams  - foot exam performed today -he does have onychodystrophy/possible onychomycosis.  I did recommend to reestablish care with podiatry.  He previously had surgery for bunions and what appears to have been hammertoe. - Return to clinic in 2-3 months  2. HL - Reviewed latest lipid panel from 04/ and 04/2022: LDL at goal, triglycerides slightly high: Lab Results  Component Value Date   CHOL 128 10/08/2021   HDL 41 10/08/2021   LDLCALC 61 10/08/2021   LDLDIRECT 56.0 04/26/2022   TRIG 181 (H) 10/08/2021   CHOLHDL 3.1 10/08/2021  - Continues Lipitor 40 mg daily without side effects.  - Total time spent for the visit: 40 min, in precharting, post charting, reviewing Dr. Cordelia Pen last note, obtaining medical information from the chart and from the pt, reviewing his  previous labs, evaluations, and treatments, reviewing his symptoms,  counseling him about his diabetes (please see the discussed topics above), and developing a plan to further treat it.  Philemon Kingdom, MD PhD [facility]

## 2022-09-27 ENCOUNTER — Encounter: Payer: Self-pay | Admitting: Diagnostic Neuroimaging

## 2022-09-27 ENCOUNTER — Ambulatory Visit: Payer: Medicare HMO | Admitting: Diagnostic Neuroimaging

## 2022-09-27 VITALS — BP 157/78 | HR 62 | Ht 67.0 in | Wt 215.0 lb

## 2022-09-27 DIAGNOSIS — G44059 Short lasting unilateral neuralgiform headache with conjunctival injection and tearing (SUNCT), not intractable: Secondary | ICD-10-CM | POA: Diagnosis not present

## 2022-09-27 DIAGNOSIS — R519 Headache, unspecified: Secondary | ICD-10-CM

## 2022-09-27 DIAGNOSIS — E236 Other disorders of pituitary gland: Secondary | ICD-10-CM

## 2022-09-27 MED ORDER — LAMOTRIGINE 100 MG PO TABS: 100.0000 mg | ORAL_TABLET | Freq: Two times a day (BID) | ORAL | 4 refills | Status: DC

## 2022-09-27 NOTE — Progress Notes (Signed)
[facility]  PATIENT: John Fry DOB: Feb 27, 1941  REFERRING CLINICIAN: Mliss Sax,* HISTORY FROM: patient  REASON FOR VISIT: follow up   HISTORICAL  CHIEF COMPLAINT:  Chief Complaint  Patient presents with   Follow-up    RM 7 alone Pt is well, reports symptoms are the as last visit. He is still having some facial pain, pressure around nose.     HISTORY OF PRESENT ILLNESS:   UPDATE (09/27/22, VRP): Since last visit, doing well until flare up 1 month ago of left face, maxillary pain. Tolerating lamotrigine  twice a day.   UPDATE (09/21/21, VRP): Since last visit, doing well. Symptoms are stable. No more HA pain. Tolerating LTG  twice a day.  UPDATE (03/24/2021, VRP): Since last visit, doing BETTER.  Lamotrigine seems to be helping symptoms. Sometimes when pain starts he places pressure on his left maxillary region and nose and this seems to symptoms.    PRIOR HPI (11/25/20, VRP): 82 year old male here for evaluation of left and facial pain.  Symptoms started 2021 with intermittent episodes of pain starting from the medial aspect of his left eye rating to his left maxillary region and left supraorbital region.  Symptoms last about 5 to 10 minutes at a time.  Sometimes his eye waters and he feels nasal congestion and develops redness in the eye.  Patient has been to ENT and had a CT maxillofacial scans which were unremarkable.  He has been treated.  With antibiotics which have not helped.  Symptoms have been almost on a daily basis or every other day.   REVIEW OF SYSTEMS: Full 14 system review of systems performed and negative with exception of: as per HPI.   ALLERGIES: Allergies  Allergen Reactions   Zestril [Lisinopril]     angioedema    HOME MEDICATIONS: Outpatient Medications Prior to Visit  Medication Sig Dispense Refill   Alcohol Swabs (B-D SINGLE USE SWABS REGULAR) PADS USE  DAILY 200 each 0   amLODipine (NORVASC) 10 MG  tablet TAKE 1 TABLET EVERY DAY 90 tablet 3   atorvastatin (LIPITOR) 40 MG tablet TAKE 1 TABLET EVERY DAY 90 tablet 3   Blood Glucose Monitoring Suppl (TRUE METRIX METER) w/Device KIT USE AS DIRECTED 1 kit 0   fluticasone (FLONASE) 50 MCG/ACT nasal spray Place 1 spray into both nostrils daily. 16 g 2   hydrALAZINE (APRESOLINE) 50 MG tablet TAKE 1 TABLET THREE TIMES DAILY 270 tablet 3   hydrochlorothiazide (HYDRODIURIL) 25 MG tablet TAKE 1 TABLET EVERY DAY 90 tablet 3   insulin NPH Human (NOVOLIN N) 100 UNIT/ML injection Inject 0.25 mLs (25 Units total) into the skin at bedtime. 20 mL 11   insulin regular (HUMULIN R) 100 units/mL injection Inject 0.25 mLs (25 Units total) into the skin 2 (two) times daily before a meal. 80 mL 11   Semaglutide,0.25 or 0.5MG /DOS, (OZEMPIC, 0.25 OR 0.5 MG/DOSE,) 2 MG/1.5ML SOPN Inject 0.5 mg into the skin once a week. 9 mL 3   sildenafil (VIAGRA) 100 MG tablet Take 100 mg by mouth daily as needed.     TRUE METRIX BLOOD GLUCOSE TEST test strip CHECK BLOOD SUGAR TWICE DAILY (APPOINTMENT IS NEEDED FOR FURTHER REFILLS) 200 strip 0   lamoTRIgine (LAMICTAL) 25 MG tablet Take 2 tablets (50 mg total) by mouth 2 (two) times daily. 360 tablet 4   No facility-administered medications prior to visit.    PAST MEDICAL HISTORY: Past Medical History:  Diagnosis Date   Diabetes mellitus  without complication    DYSLIPIDEMIA 03/05/2007   Helicobacter pylori (H. pylori) 09/25/2013   HERNIA 03/05/2007   HYPERTENSION 03/05/2007   Prostate cancer 07/23/13   Gleason 7, volume 53 ml   RENAL INSUFFICIENCY 05/22/2008   S/P radiation therapy 04/14/2014 through 06/11/2014                                                      Prostate 7800 cGy in 40 sessions, seminal vesicles 5600 cGy in 40 sessions                         SHOULDER PAIN, RIGHT 05/08/2008   VENTRICULAR HYPERTROPHY, LEFT 05/08/2008    PAST SURGICAL HISTORY: Past Surgical History:  Procedure Laterality Date   FOOT SURGERY   1999   Right Foot Bunion Repair   Lower Arterial  05/06/2003   PROSTATE BIOPSY  07/23/13   gleason 7, volume 53 ml   remove fatty tumor Left 1995   thigh    FAMILY HISTORY: Family History  Problem Relation Age of Onset   Cancer Father 2       prostate   Kidney Stones Brother    Diabetes Sister    Hypertension Neg Hx    Colon cancer Neg Hx     SOCIAL HISTORY: Social History   Socioeconomic History   Marital status: Widowed    Spouse name: Not on file   Number of children: 2   Years of education: Not on file   Highest education level: Not on file  Occupational History    Comment: Retired  Tobacco Use   Smoking status: Former    Packs/day: 0.50    Years: 20.00    Additional pack years: 0.00    Total pack years: 10.00    Types: Cigarettes    Quit date: 06/20/2006    Years since quitting: 16.2   Smokeless tobacco: Never  Substance and Sexual Activity   Alcohol use: No    Alcohol/week: 0.0 standard drinks of alcohol    Comment: quit 17 years ago   Drug use: No   Sexual activity: Not Currently  Other Topics Concern   Not on file  Social History Narrative   Lives alone   Social Determinants of Health   Financial Resource Strain: Low Risk  (07/15/2022)   Overall Financial Resource Strain (CARDIA)    Difficulty of Paying Living Expenses: Not hard at all  Food Insecurity: No Food Insecurity (07/15/2022)   Hunger Vital Sign    Worried About Running Out of Food in the Last Year: Never true    Ran Out of Food in the Last Year: Never true  Transportation Needs: No Transportation Needs (07/15/2022)   PRAPARE - Administrator, Civil Service (Medical): No    Lack of Transportation (Non-Medical): No  Physical Activity: Inactive (07/15/2022)   Exercise Vital Sign    Days of Exercise per Week: 0 days    Minutes of Exercise per Session: 0 min  Stress: No Stress Concern Present (07/15/2022)   [employer] of Occupational Health - Occupational Stress  Questionnaire    Feeling of Stress : Not at all  Social Connections: Moderately Isolated (07/13/2021)   Social Connection and Isolation Panel [NHANES]    Frequency of Communication with Friends and Family:  Three times a week    Frequency of Social Gatherings with Friends and Family: Three times a week    Attends Religious Services: More than 4 times per year    Active Member of Clubs or Organizations: No    Attends BankerClub or Organization Meetings: Never    Marital Status: Widowed  Intimate Partner Violence: Not At Risk (07/13/2021)   Humiliation, Afraid, Rape, and Kick questionnaire    Fear of Current or Ex-Partner: No    Emotionally Abused: No    Physically Abused: No    Sexually Abused: No     PHYSICAL EXAM  GENERAL EXAM/CONSTITUTIONAL: Vitals:  Vitals:   09/27/22 1617  BP: (!) 154/74  Pulse: 69  Weight: 215 lb (97.5 kg)  Height: 5\' 7"  (1.702 m)   Body mass index is 33.67 kg/m. Wt Readings from Last 3 Encounters:  09/27/22 215 lb (97.5 kg)  08/15/22 219 lb (99.3 kg)  07/15/22 210 lb (95.3 kg)   Patient is in no distress; well developed, nourished and groomed; neck is supple  CARDIOVASCULAR: Examination of carotid arteries is normal; no carotid bruits Regular rate and rhythm, no murmurs Examination of peripheral vascular system by observation and palpation is normal  EYES: Ophthalmoscopic exam of optic discs and posterior segments is normal; no papilledema or hemorrhages; CONJUNCTIVAL INJECTION No results found.  MUSCULOSKELETAL: Gait, strength, tone, movements noted in Neurologic exam below  NEUROLOGIC: MENTAL STATUS:      No data to display         awake, alert, oriented to person, place and time recent and remote memory intact normal attention and concentration language fluent, comprehension intact, naming intact fund of knowledge appropriate  CRANIAL NERVE:  2nd - no papilledema on fundoscopic exam 2nd, 3rd, 4th, 6th - pupils equal and reactive to  light, visual fields full to confrontation, extraocular muscles intact, no nystagmus 5th - facial sensation symmetric 7th - facial strength symmetric 8th - hearing intact 9th - palate elevates symmetrically, uvula midline 11th - shoulder shrug symmetric 12th - tongue protrusion midline  MOTOR:  normal bulk and tone, full strength in the BUE, BLE  SENSORY:  normal and symmetric to light touch, temperature, vibration  COORDINATION:  finger-nose-finger, fine finger movements normal  REFLEXES:  deep tendon reflexes present and symmetric  GAIT/STATION:  narrow based gait     DIAGNOSTIC DATA (LABS, IMAGING, TESTING) - I reviewed patient records, labs, notes, testing and imaging myself where available.  Lab Results  Component Value Date   WBC 8.5 10/08/2021   HGB 14.4 10/08/2021   HCT 42.2 10/08/2021   MCV 92.5 10/08/2021   PLT 266 10/08/2021      Component Value Date/Time   NA 138 04/26/2022 1414   K 3.8 04/26/2022 1414   CL 104 04/26/2022 1414   CO2 24 04/26/2022 1414   GLUCOSE 108 (H) 04/26/2022 1414   BUN 23 04/26/2022 1414   CREATININE 1.70 (H) 04/26/2022 1414   CREATININE 2.64 (H) 10/08/2021 1653   CALCIUM 10.0 04/26/2022 1414   CALCIUM 10.0 10/28/2011 1124   PROT 7.1 10/08/2021 1653   ALBUMIN 4.9 09/10/2020 1123   AST 33 10/08/2021 1653   ALT 25 10/08/2021 1653   ALKPHOS 67 09/10/2020 1123   BILITOT 0.5 10/08/2021 1653   GFRNONAA 40 (L) 09/27/2013 0835   GFRAA 46 (L) 09/27/2013 0835   Lab Results  Component Value Date   CHOL 128 10/08/2021   HDL 41 10/08/2021   LDLCALC 61 10/08/2021  LDLDIRECT 56.0 04/26/2022   TRIG 181 (H) 10/08/2021   CHOLHDL 3.1 10/08/2021   Lab Results  Component Value Date   HGBA1C 8.2 (A) 08/15/2022   No results found for: "VITAMINB12" Lab Results  Component Value Date   TSH 1.68 04/06/2018    12/10/20 MRI brain  MRI of the brain with and without contrast shows the following: 1.   Although the left trigeminal nerve  has normal signal, there are adjacent blood vessels near the dorsal root entry zone.  Unclear if this could contribute to facial pain. 2.   6 x 8 mm nonenhancing mass in the pituitary gland.  It could represent a Rathke cleft cyst, other cyst or less likely a microadenoma. 3.   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 4.   No acute findings.    ASSESSMENT AND PLAN  82 y.o. year old male here with intermittent left facial pain with signs and symptoms consistent with SUNCT, since 2021.    Dx:  1. SUNCT (short unilateral neuralgiform headache, conjunctival inj/tear)   2. Left facial pain   3. 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    Dispense:  180 tablet    Refill:  4   Return in about 1 year (around 09/27/2023).    Suanne Marker, MD 09/27/2022, 4:26 PM Certified in Neurology, Neurophysiology and Neuroimaging  [facility] [address] [place], [place] [address] [phone]

## 2022-09-28 ENCOUNTER — Telehealth: Payer: Self-pay | Admitting: Diagnostic Neuroimaging

## 2022-09-28 NOTE — Telephone Encounter (Signed)
John Fry: [id number] exp. 09/28/22-10/28/22 sent to GI [phone]

## 2022-11-24 ENCOUNTER — Encounter: Payer: Self-pay | Admitting: Family Medicine

## 2022-11-24 ENCOUNTER — Ambulatory Visit (INDEPENDENT_AMBULATORY_CARE_PROVIDER_SITE_OTHER): Payer: Medicare HMO | Admitting: Family Medicine

## 2022-11-24 VITALS — BP 136/72 | HR 77 | Temp 98.3°F | Ht 67.0 in | Wt 210.6 lb

## 2022-11-24 DIAGNOSIS — I10 Essential (primary) hypertension: Secondary | ICD-10-CM | POA: Diagnosis not present

## 2022-11-24 DIAGNOSIS — E785 Hyperlipidemia, unspecified: Secondary | ICD-10-CM

## 2022-11-24 DIAGNOSIS — N1832 Chronic kidney disease, stage 3b: Secondary | ICD-10-CM | POA: Diagnosis not present

## 2022-11-24 LAB — CBC
HCT: 43.4 % (ref 39.0–52.0)
Hemoglobin: 14.2 g/dL (ref 13.0–17.0)
MCHC: 32.7 g/dL (ref 30.0–36.0)
MCV: 95.6 fl (ref 78.0–100.0)
Platelets: 261 10*3/uL (ref 150.0–400.0)
RBC: 4.54 Mil/uL (ref 4.22–5.81)
RDW: 13.8 % (ref 11.5–15.5)
WBC: 7.7 10*3/uL (ref 4.0–10.5)

## 2022-11-24 LAB — LIPID PANEL
Cholesterol: 132 mg/dL (ref 0–200)
HDL: 44.4 mg/dL (ref >39.00)
LDL Cholesterol: 60 mg/dL (ref 0–99)
NonHDL: 87.96
Total CHOL/HDL Ratio: 3
Triglycerides: 142 mg/dL (ref 0.0–149.0)
VLDL: 28.4 mg/dL (ref 0.0–40.0)

## 2022-11-24 LAB — COMPREHENSIVE METABOLIC PANEL
ALT: 21 U/L (ref 0–53)
AST: 22 U/L (ref 0–37)
Albumin: 4.9 g/dL (ref 3.5–5.2)
Alkaline Phosphatase: 68 U/L (ref 39–117)
BUN: 21 mg/dL (ref 6–23)
CO2: 26 mEq/L (ref 19–32)
Calcium: 10 mg/dL (ref 8.4–10.5)
Chloride: 104 mEq/L (ref 96–112)
Creatinine, Ser: 1.91 mg/dL — ABNORMAL HIGH (ref 0.40–1.50)
GFR: 32.24 mL/min — ABNORMAL LOW (ref >60.00)
Glucose, Bld: 104 mg/dL — ABNORMAL HIGH (ref 70–99)
Potassium: 4.4 mEq/L (ref 3.5–5.1)
Sodium: 141 mEq/L (ref 135–145)
Total Bilirubin: 0.5 mg/dL (ref 0.2–1.2)
Total Protein: 7.5 g/dL (ref 6.0–8.3)

## 2022-11-24 NOTE — Addendum Note (Signed)
Addended by: Jaekwon Mcclune on: 11/24/2022 05:24 PM   Modules accepted: Orders  

## 2022-11-24 NOTE — Progress Notes (Signed)
Established Patient Office Visit   Subjective:  Patient ID: John Fry, male    DOB: 06-27-1940  Age: 82 y.o. MRN: [id number]  Chief Complaint  Patient presents with   Medical Management of Chronic Issues    6 month follow up, no concerns. Patient fasting.     HPI Encounter Diagnoses  Name Primary?   Stage 3b chronic kidney disease (HCC) Yes   Essential hypertension    Dyslipidemia    For follow-up of above.  Continues amlodipine 10 mg daily, HCTZ 25 daily and Apresoline 50 mg 3 times daily for hypertension.  Compliance with all medications.  He is careful with salt intake.  Continues atorvastatin 40 mg for lipid control.   Review of Systems  Constitutional: Negative.   HENT: Negative.    Eyes:  Negative for blurred vision, discharge and redness.  Respiratory: Negative.    Cardiovascular: Negative.   Gastrointestinal:  Negative for abdominal pain.  Genitourinary: Negative.   Musculoskeletal: Negative.  Negative for myalgias.  Skin:  Negative for rash.  Neurological:  Negative for tingling, loss of consciousness and weakness.  Endo/Heme/Allergies:  Negative for polydipsia.     Current Outpatient Medications:    Alcohol Swabs (B-D SINGLE USE SWABS REGULAR) PADS, USE  DAILY, Disp: 200 each, Rfl: 0   amLODipine (NORVASC) 10 MG tablet, TAKE 1 TABLET EVERY DAY, Disp: 90 tablet, Rfl: 3   atorvastatin (LIPITOR) 40 MG tablet, TAKE 1 TABLET EVERY DAY, Disp: 90 tablet, Rfl: 3   Blood Glucose Monitoring Suppl (TRUE METRIX METER) w/Device KIT, USE AS DIRECTED, Disp: 1 kit, Rfl: 0   fluticasone (FLONASE) 50 MCG/ACT nasal spray, Place 1 spray into both nostrils daily., Disp: 16 g, Rfl: 2   hydrALAZINE (APRESOLINE) 50 MG tablet, TAKE 1 TABLET THREE TIMES DAILY, Disp: 270 tablet, Rfl: 3   hydrochlorothiazide (HYDRODIURIL) 25 MG tablet, TAKE 1 TABLET EVERY DAY, Disp: 90 tablet, Rfl: 3   insulin NPH Human (NOVOLIN N) 100 UNIT/ML injection, Inject 0.25 mLs (25 Units total) into  the skin at bedtime., Disp: 20 mL, Rfl: 11   insulin regular (HUMULIN R) 100 units/mL injection, Inject 0.25 mLs (25 Units total) into the skin 2 (two) times daily before a meal., Disp: 80 mL, Rfl: 11   lamoTRIgine (LAMICTAL) 100 MG tablet, Take 1 tablet (100 mg total) by mouth 2 (two) times daily., Disp: 180 tablet, Rfl: 4   Semaglutide,0.25 or 0.5MG /DOS, (OZEMPIC, 0.25 OR 0.5 MG/DOSE,) 2 MG/1.5ML SOPN, Inject 0.5 mg into the skin once a week., Disp: 9 mL, Rfl: 3   TRUE METRIX BLOOD GLUCOSE TEST test strip, CHECK BLOOD SUGAR TWICE DAILY (APPOINTMENT IS NEEDED FOR FURTHER REFILLS), Disp: 200 strip, Rfl: 0   sildenafil (VIAGRA) 100 MG tablet, Take 100 mg by mouth daily as needed. (Patient not taking: Reported on 11/24/2022), Disp: , Rfl:    Objective:     BP (!) 148/68 (BP Location: Right Arm, Patient Position: Sitting, Cuff Size: Large)   Pulse 77   Temp 98.3 F (36.8 C) (Temporal)   Ht 5\' 7"  (1.702 m)   Wt 210 lb 9.6 oz (95.5 kg)   SpO2 98%   BMI 32.98 kg/m  BP Readings from Last 3 Encounters:  11/24/22 (!) 148/68  09/27/22 (!) 157/78  08/15/22 (!) 140/84   Wt Readings from Last 3 Encounters:  11/24/22 210 lb 9.6 oz (95.5 kg)  09/27/22 215 lb (97.5 kg)  08/15/22 219 lb (99.3 kg)      Physical Exam  Constitutional:      General: He is not in acute distress.    Appearance: Normal appearance. He is not ill-appearing, toxic-appearing or diaphoretic.  HENT:     Head: Normocephalic and atraumatic.     Right Ear: External ear normal.     Left Ear: External ear normal.     Mouth/Throat:     Mouth: Mucous membranes are moist.     Pharynx: Oropharynx is clear. No oropharyngeal exudate or posterior oropharyngeal erythema.  Eyes:     General: No scleral icterus.       Right eye: No discharge.        Left eye: No discharge.     Extraocular Movements: Extraocular movements intact.     Conjunctiva/sclera: Conjunctivae normal.     Pupils: Pupils are equal, round, and reactive to light.   Cardiovascular:     Rate and Rhythm: Normal rate and regular rhythm.  Pulmonary:     Effort: Pulmonary effort is normal. No respiratory distress.     Breath sounds: Normal breath sounds.  Abdominal:     General: Bowel sounds are normal.  Musculoskeletal:     Cervical back: No rigidity or tenderness.  Skin:    General: Skin is warm and dry.  Neurological:     Mental Status: He is alert and oriented to person, place, and time.  Psychiatric:        Mood and Affect: Mood normal.        Behavior: Behavior normal.      No results found for any visits on 11/24/22.    The ASCVD Risk score (Arnett DK, et al., 2019) failed to calculate for the following reasons:   The 2019 ASCVD risk score is only valid for ages 77 to 28    Assessment & Plan:   Stage 3b chronic kidney disease (HCC) -     Comprehensive metabolic panel -     Urinalysis, Routine w reflex microscopic -     Microalbumin / creatinine urine ratio  Essential hypertension -     Comprehensive metabolic panel -     CBC -     Urinalysis, Routine w reflex microscopic -     Microalbumin / creatinine urine ratio  Dyslipidemia -     Comprehensive metabolic panel -     Lipid panel    Return in about 3 months (around 02/24/2023), or Check and record blood pressures at [facility].  Please bring the list with you next visit..  Information was given manage hypertension.  Will continue to minimize sodium in the diet.  Could consider adding a beta-blocker or changing to [facility]..  Developed angioedema with lisinopril.   Mliss Sax, MD

## 2022-11-24 NOTE — Addendum Note (Signed)
Addended by: Lake Bells on: 11/24/2022 05:24 PM   Modules accepted: Orders

## 2022-12-19 ENCOUNTER — Encounter: Payer: Self-pay | Admitting: Internal Medicine

## 2022-12-19 ENCOUNTER — Ambulatory Visit: Payer: Medicare HMO | Admitting: Internal Medicine

## 2022-12-19 VITALS — BP 138/80 | HR 76 | Ht 67.0 in | Wt 212.6 lb

## 2022-12-19 DIAGNOSIS — Z794 Long term (current) use of insulin: Secondary | ICD-10-CM | POA: Diagnosis not present

## 2022-12-19 DIAGNOSIS — N184 Chronic kidney disease, stage 4 (severe): Secondary | ICD-10-CM

## 2022-12-19 DIAGNOSIS — E119 Type 2 diabetes mellitus without complications: Secondary | ICD-10-CM

## 2022-12-19 DIAGNOSIS — Z7985 Long-term (current) use of injectable non-insulin antidiabetic drugs: Secondary | ICD-10-CM

## 2022-12-19 DIAGNOSIS — E1122 Type 2 diabetes mellitus with diabetic chronic kidney disease: Secondary | ICD-10-CM | POA: Diagnosis not present

## 2022-12-19 DIAGNOSIS — E1022 Type 1 diabetes mellitus with diabetic chronic kidney disease: Secondary | ICD-10-CM

## 2022-12-19 LAB — HEMOGLOBIN A1C: Hemoglobin A1C: 6

## 2022-12-19 NOTE — Patient Instructions (Signed)
Please continue: - NPH 25 units at bedtime - R insulin 20-25 units 2x a day 30 minutes before meals - Ozempic 0.5 mg weekly   Please return for another visit in 3-4 months.

## 2022-12-19 NOTE — Progress Notes (Signed)
Patient ID: John Fry, male   DOB: 29-Aug-1940, 82 y.o.   MRN: [id number]  HPI: John Fry is a 82 y.o.-year-old male, returning for follow-up for DM2, dx in 2015, insulin-dependent, uncontrolled, with complications (CKD). Pt. previously saw Dr. Everardo All.  He saw Dr. Everardo All for more than 20 years.  Last visit with me 4 months ago.  Interim history: No increased urination, blurry vision, nausea, chest pain. Since last visit, he restarted Ozempic, however, he mentions that his sugars too expensive for him and would not be able to continue.  Reviewed HbA1c: Lab Results  Component Value Date   HGBA1C 8.2 (A) 08/15/2022   HGBA1C 7.1 (H) 04/26/2022   HGBA1C 8.3 (A) 10/28/2020   HGBA1C 8.9 (A) 08/26/2020   HGBA1C 8.6 (A) 06/25/2020   HGBA1C 8.1 (A) 03/25/2020   HGBA1C 7.2 (A) 11/12/2019   HGBA1C 8.1 (A) 09/09/2019   HGBA1C 8.4 (A) 07/10/2019   HGBA1C 7.4 (A) 03/08/2019   Pt is on a regimen of: (vials) - NPH 30 >> 25 units at bedtime - R insulin 25-30 units twice a day 20-30 min before meals - Ozempic 0.25 mg weekly - ran out 3.5 mo ago and was not able to refill >> restarted: 0.5 mg weekly ($$: 300$ for 3 mo)  Pt checks his sugars 2x a day and they are: - am: 104-115 >> 106-115 - 2h after b'fast: n/c - before lunch: n/c - 2h after lunch: n/c >> up to 151 - before dinner: 105-110 >> 104-118 - 2h after dinner: 130 >> n/c - bedtime: n/c - nighttime: 95-110 >> 95 Lowest sugar was 85 (when active in the yard) >> 95; he has hypoglycemia awareness at 80s.  Highest sugar was 200 - not recently >> 151.  Glucometer: True Metrix  Pt's meals are: - Breakfast: 1-2 eggs + bacon + toast, milk - Lunch: sandwich, PB crackers - Dinner: meat + starch + veggie, but a lot of times: just veggies  - + CKD 3-4 - sees [facility], last BUN/creatinine:  Lab Results  Component Value Date   BUN 21 11/24/2022   BUN 23 04/26/2022   CREATININE 1.91 (H) 11/24/2022   CREATININE 1.70 (H)  04/26/2022  He is not on ACE inhibitor/ARB.  He developed angioedema from lisinopril.  -+ HL; last set of lipids: Lab Results  Component Value Date   CHOL 132 11/24/2022   HDL 44.40 11/24/2022   LDLCALC 60 11/24/2022   LDLDIRECT 56.0 04/26/2022   TRIG 142.0 11/24/2022   CHOLHDL 3 11/24/2022  On Lipitor 40 mg daily.  - last eye exam was in 07/2022. No DR reportedly. + open-angle glaucoma - OS. + cataract - OD  - no numbness and tingling in his feet.  Last foot exam 08/15/2022.  He also has a history of hyperparathyroidism, unclear if related to his kidney dysfunction.  Lab Results  Component Value Date   PTH 63 04/06/2018   PTH 87 (H) 03/15/2017   PTH 107 (H) 11/02/2015   PTH 129 (H) 01/29/2015   PTH 111 (H) 10/09/2014   PTH 109 (H) 04/29/2014   PTH 55.5 10/28/2011   Lab Results  Component Value Date   CALCIUM 10.0 11/24/2022   CALCIUM 10.0 04/26/2022   CALCIUM 9.9 10/08/2021   CALCIUM 10.1 09/10/2020   CALCIUM 9.9 03/13/2020   CALCIUM 9.4 09/11/2019   CALCIUM 10.4 04/06/2018   CALCIUM 10.4 (H) 04/06/2018   CALCIUM 9.5 03/15/2017   CALCIUM 9.8 03/15/2017   Lab Results  Component Value Date   VD25OH 57.14 04/06/2018   VD25OH 50.16 03/15/2017   VD25OH 26.75 (L) 11/02/2015   VD25OH 47.19 01/29/2015   VD25OH 19.92 (L) 10/09/2014   He has a history of HTN.  ROS: + see HPI  Past Medical History:  Diagnosis Date   Diabetes mellitus without complication (HCC)    DYSLIPIDEMIA 03/05/2007   Helicobacter pylori (H. 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   RENAL INSUFFICIENCY 05/22/2008   S/P radiation therapy 04/14/2014 through 06/11/2014                                                      Prostate 7800 cGy in 40 sessions, seminal vesicles 5600 cGy in 40 sessions                         SHOULDER PAIN, RIGHT 05/08/2008   VENTRICULAR HYPERTROPHY, LEFT 05/08/2008   Past Surgical  History:  Procedure Laterality Date   FOOT SURGERY  1999   Right Foot Bunion Repair   Lower Arterial  05/06/2003   PROSTATE BIOPSY  07/23/13   gleason 7, volume 53 ml   remove fatty tumor Left 1995   thigh   Social History   Socioeconomic History   Marital status: Widowed    Spouse name: Not on file   Number of children: 2   Years of education: Not on file   Highest education level: Not on file  Occupational History    Comment: Retired  Tobacco Use   Smoking status: Former    Packs/day: 0.50    Years: 20.00    Additional pack years: 0.00    Total pack years: 10.00    Types: Cigarettes    Quit date: 06/20/2006    Years since quitting: 16.5   Smokeless tobacco: Never  Substance and Sexual Activity   Alcohol use: No    Alcohol/week: 0.0 standard drinks of alcohol    Comment: quit 17 years ago   Drug use: No   Sexual activity: Not Currently  Other Topics Concern   Not on file  Social History Narrative   Lives alone   Social Determinants of Health   Financial Resource Strain: Low Risk  (07/15/2022)   Overall Financial Resource Strain (CARDIA)    Difficulty of Paying Living Expenses: Not hard at all  Food Insecurity: No Food Insecurity (07/15/2022)   Hunger Vital Sign    Worried About Running Out of Food in the Last Year: Never true    Ran Out of Food in the Last Year: Never true  Transportation Needs: No Transportation Needs (07/15/2022)   PRAPARE - Administrator, Civil Service (Medical): No    Lack of Transportation (Non-Medical): No  Physical Activity: Inactive (07/15/2022)   Exercise Vital Sign    Days of Exercise per Week: 0 days    Minutes of Exercise per Session: 0 min  Stress: No Stress Concern Present (07/15/2022)   [employer] of Occupational Health - Occupational Stress Questionnaire    Feeling of Stress : Not at all  Social Connections: Moderately Isolated (07/13/2021)   Social Connection and Isolation Panel [NHANES]    Frequency of  Communication with Friends and Family:  Three times a week    Frequency of Social Gatherings with Friends and Family: Three times a week    Attends Religious Services: More than 4 times per year    Active Member of Clubs or Organizations: No    Attends Banker Meetings: Never    Marital Status: Widowed  Intimate Partner Violence: Not At Risk (07/13/2021)   Humiliation, Afraid, Rape, and Kick questionnaire    Fear of Current or Ex-Partner: No    Emotionally Abused: No    Physically Abused: No    Sexually Abused: No   Current Outpatient Medications on File Prior to Visit  Medication Sig Dispense Refill   Alcohol Swabs (B-D SINGLE USE SWABS REGULAR) PADS USE  DAILY 200 each 0   amLODipine (NORVASC) 10 MG tablet TAKE 1 TABLET EVERY DAY 90 tablet 3   atorvastatin (LIPITOR) 40 MG tablet TAKE 1 TABLET EVERY DAY 90 tablet 3   Blood Glucose Monitoring Suppl (TRUE METRIX METER) w/Device KIT USE AS DIRECTED 1 kit 0   fluticasone (FLONASE) 50 MCG/ACT nasal spray Place 1 spray into both nostrils daily. 16 g 2   hydrALAZINE (APRESOLINE) 50 MG tablet TAKE 1 TABLET THREE TIMES DAILY 270 tablet 3   hydrochlorothiazide (HYDRODIURIL) 25 MG tablet TAKE 1 TABLET EVERY DAY 90 tablet 3   insulin NPH Human (NOVOLIN N) 100 UNIT/ML injection Inject 0.25 mLs (25 Units total) into the skin at bedtime. 20 mL 11   insulin regular (HUMULIN R) 100 units/mL injection Inject 0.25 mLs (25 Units total) into the skin 2 (two) times daily before a meal. 80 mL 11   lamoTRIgine (LAMICTAL) 100 MG tablet Take 1 tablet (100 mg total) by mouth 2 (two) times daily. 180 tablet 4   Semaglutide,0.25 or 0.5MG /DOS, (OZEMPIC, 0.25 OR 0.5 MG/DOSE,) 2 MG/1.5ML SOPN Inject 0.5 mg into the skin once a week. 9 mL 3   sildenafil (VIAGRA) 100 MG tablet Take 100 mg by mouth daily as needed. (Patient not taking: Reported on 11/24/2022)     TRUE METRIX BLOOD GLUCOSE TEST test strip CHECK BLOOD SUGAR TWICE DAILY (APPOINTMENT IS NEEDED FOR  FURTHER REFILLS) 200 strip 0   No current facility-administered medications on file prior to visit.   Allergies  Allergen Reactions   Zestril [Lisinopril]     angioedema   Family History  Problem Relation Age of Onset   Cancer Father 67       prostate   Kidney Stones Brother    Diabetes Sister    Hypertension Neg Hx    Colon cancer Neg Hx    PE: BP 138/80   Pulse 76   Ht 5\' 7"  (1.702 m)   Wt 212 lb 9.6 oz (96.4 kg)   SpO2 98%   BMI 33.30 kg/m  Wt Readings from Last 3 Encounters:  12/19/22 212 lb 9.6 oz (96.4 kg)  11/24/22 210 lb 9.6 oz (95.5 kg)  09/27/22 215 lb (97.5 kg)   Constitutional: overweight, in NAD Eyes:  EOMI, no exophthalmos ENT: no neck masses, no cervical lymphadenopathy Cardiovascular: RRR, No MRG Respiratory: CTA B Musculoskeletal: no deformities Skin:no rashes Neurological: + mild tremor with outstretched hands  ASSESSMENT: 1. DM2, insulin-dependent, uncontrolled, with complications - + CKD  2. HL  PLAN:  1. Patient with longstanding, uncontrolled, type 2 diabetes, on injectable antidiabetic regimen with basal/bolus insulin regimen and GLP-1 receptor agonist, restarted at last visit.  At that time, HbA1c was higher, at 8.2%, which sugars at goal in the  morning and later in the day but discrepant with the HbA1c.  He mentions that his sugars were dropping during the night to the 90s and he had to eat candy to raise them.  He was also dropping his blood sugars during the day if he was working in his garden.  I advised him to reduce the dose of NPH and regular before meals and then also to try to add back Ozempic as he was off for 2 weeks. -At today's visit, sugars are at goal.  HbA1c now reflects his sugars at home, much improved! -Unfortunately, he tells me that he would not be able to afford the Ozempic since he is on a fixed income.  At today's visit, we gave him paperwork for the [employer] patient assistance program.  I also advised him to let me  know if he runs out of Ozempic to see if we have samples to give him.  It would be medically necessary for him to continue this due to the significant diabetes and weight improvement after starting it.  For now, we will continue the current regimen. - I suggested to:  Patient Instructions  Please continue: - NPH 25 units at bedtime - R insulin 20-25 units 2x a day 30 minutes before meals - Ozempic 0.5 mg weekly   Please return for another visit in 3-4 months.   - we checked his HbA1c: 6% (lower, the best in many years!) - advised to check sugars at different times of the day - 4x a day, rotating check times - advised for yearly eye exams >> he is UTD - return to clinic in 3-4 months  2. HL -Reviewed latest lipid panel from 11/2022: Fractions at goal Lab Results  Component Value Date   CHOL 132 11/24/2022   HDL 44.40 11/24/2022   LDLCALC 60 11/24/2022   LDLDIRECT 56.0 04/26/2022   TRIG 142.0 11/24/2022   CHOLHDL 3 11/24/2022  -He is on Lipitor 40 mg daily without side effects  Carlus Pavlov, MD PhD [facility]

## 2022-12-27 DIAGNOSIS — H401131 Primary open-angle glaucoma, bilateral, mild stage: Secondary | ICD-10-CM | POA: Diagnosis not present

## 2022-12-27 DIAGNOSIS — H2513 Age-related nuclear cataract, bilateral: Secondary | ICD-10-CM | POA: Diagnosis not present

## 2022-12-27 DIAGNOSIS — H04123 Dry eye syndrome of bilateral lacrimal glands: Secondary | ICD-10-CM | POA: Diagnosis not present

## 2022-12-27 DIAGNOSIS — E119 Type 2 diabetes mellitus without complications: Secondary | ICD-10-CM | POA: Diagnosis not present

## 2023-02-24 ENCOUNTER — Telehealth: Payer: Self-pay | Admitting: Family Medicine

## 2023-02-24 ENCOUNTER — Ambulatory Visit: Payer: Medicare HMO | Admitting: Family Medicine

## 2023-02-24 NOTE — Telephone Encounter (Signed)
2nd no show, final warning letter sent via mail to reschedule/notify of policy

## 2023-02-24 NOTE — Telephone Encounter (Signed)
9.6.24 no show/ letter sent

## 2023-03-07 ENCOUNTER — Encounter: Payer: Self-pay | Admitting: Family Medicine

## 2023-03-07 ENCOUNTER — Ambulatory Visit (INDEPENDENT_AMBULATORY_CARE_PROVIDER_SITE_OTHER): Payer: Medicare HMO | Admitting: Family Medicine

## 2023-03-07 VITALS — BP 140/70 | HR 74 | Temp 98.2°F | Ht 67.0 in | Wt 203.4 lb

## 2023-03-07 DIAGNOSIS — I10 Essential (primary) hypertension: Secondary | ICD-10-CM

## 2023-03-07 DIAGNOSIS — N1832 Chronic kidney disease, stage 3b: Secondary | ICD-10-CM

## 2023-03-07 DIAGNOSIS — Z23 Encounter for immunization: Secondary | ICD-10-CM | POA: Diagnosis not present

## 2023-03-07 LAB — BASIC METABOLIC PANEL
BUN: 22 mg/dL (ref 6–23)
CO2: 27 meq/L (ref 19–32)
Calcium: 10.3 mg/dL (ref 8.4–10.5)
Chloride: 103 meq/L (ref 96–112)
Creatinine, Ser: 1.92 mg/dL — ABNORMAL HIGH (ref 0.40–1.50)
GFR: 31.98 mL/min — ABNORMAL LOW (ref >60.00)
Glucose, Bld: 71 mg/dL (ref 70–99)
Potassium: 4.6 meq/L (ref 3.5–5.1)
Sodium: 138 meq/L (ref 135–145)

## 2023-03-07 LAB — URINALYSIS, ROUTINE W REFLEX MICROSCOPIC
Bilirubin Urine: NEGATIVE
Hgb urine dipstick: NEGATIVE
Ketones, ur: NEGATIVE
Nitrite: NEGATIVE
RBC / HPF: NONE SEEN (ref 0–?)
Specific Gravity, Urine: 1.015 (ref 1.000–1.030)
Urine Glucose: NEGATIVE
Urobilinogen, UA: 0.2 (ref 0.0–1.0)
pH: 6 (ref 5.0–8.0)

## 2023-03-07 LAB — MICROALBUMIN / CREATININE URINE RATIO
Creatinine,U: 254.4 mg/dL
Microalb Creat Ratio: 3.5 mg/g (ref 0.0–30.0)
Microalb, Ur: 9 mg/dL — ABNORMAL HIGH (ref 0.0–1.9)

## 2023-03-07 NOTE — Progress Notes (Signed)
Established Patient Office Visit   Subjective:  Patient ID: John Fry, male    DOB: September 03, 1940  Age: 82 y.o. MRN: [id number]  Chief Complaint  Patient presents with   Medical Management of Chronic Issues    3 month follow up. Pt is fasting.     HPI Encounter Diagnoses  Name Primary?   Stage 3b chronic kidney disease (HCC) Yes   Essential hypertension    Need for immunization against influenza    For follow-up of above.  Blood pressure has been controlled well with amlodipine 10 mg.  Apresoline 50 mg 3 times daily, HCTZ 25 mg daily.  Blood pressures at home correspond to what we have have measured in the clinic today.  He has been stressed.  Just returned from [place] where he had been for the last 4 weeks helping his daughter who live recently had eye surgery.   Review of Systems  Constitutional: Negative.   HENT: Negative.    Eyes:  Negative for blurred vision, discharge and redness.  Respiratory: Negative.    Cardiovascular: Negative.   Gastrointestinal:  Negative for abdominal pain.  Genitourinary: Negative.   Musculoskeletal: Negative.  Negative for myalgias.  Skin:  Negative for rash.  Neurological:  Negative for tingling, loss of consciousness and weakness.  Endo/Heme/Allergies:  Negative for polydipsia.     Current Outpatient Medications:    Alcohol Swabs (B-D SINGLE USE SWABS REGULAR) PADS, USE  DAILY, Disp: 200 each, Rfl: 0   amLODipine (NORVASC) 10 MG tablet, TAKE 1 TABLET EVERY DAY, Disp: 90 tablet, Rfl: 3   atorvastatin (LIPITOR) 40 MG tablet, TAKE 1 TABLET EVERY DAY, Disp: 90 tablet, Rfl: 3   Blood Glucose Monitoring Suppl (TRUE METRIX METER) w/Device KIT, USE AS DIRECTED, Disp: 1 kit, Rfl: 0   fluticasone (FLONASE) 50 MCG/ACT nasal spray, Place 1 spray into both nostrils daily., Disp: 16 g, Rfl: 2   hydrALAZINE (APRESOLINE) 50 MG tablet, TAKE 1 TABLET THREE TIMES DAILY, Disp: 270 tablet, Rfl: 3   hydrochlorothiazide (HYDRODIURIL) 25 MG  tablet, TAKE 1 TABLET EVERY DAY, Disp: 90 tablet, Rfl: 3   insulin NPH Human (NOVOLIN N) 100 UNIT/ML injection, Inject 0.25 mLs (25 Units total) into the skin at bedtime., Disp: 20 mL, Rfl: 11   insulin regular (HUMULIN R) 100 units/mL injection, Inject 0.25 mLs (25 Units total) into the skin 2 (two) times daily before a meal., Disp: 80 mL, Rfl: 11   lamoTRIgine (LAMICTAL) 100 MG tablet, Take 1 tablet (100 mg total) by mouth 2 (two) times daily., Disp: 180 tablet, Rfl: 4   Semaglutide,0.25 or 0.5MG /DOS, (OZEMPIC, 0.25 OR 0.5 MG/DOSE,) 2 MG/1.5ML SOPN, Inject 0.5 mg into the skin once a week., Disp: 9 mL, Rfl: 3   sildenafil (VIAGRA) 100 MG tablet, Take 100 mg by mouth daily as needed., Disp: , Rfl:    TRUE METRIX BLOOD GLUCOSE TEST test strip, CHECK BLOOD SUGAR TWICE DAILY (APPOINTMENT IS NEEDED FOR FURTHER REFILLS), Disp: 200 strip, Rfl: 0   Objective:     BP (!) 140/70   Pulse 74   Temp 98.2 F (36.8 C)   Ht 5\' 7"  (1.702 m)   Wt 203 lb 6.4 oz (92.3 kg)   SpO2 (!) 74%   BMI 31.86 kg/m    Physical Exam Constitutional:      General: He is not in acute distress.    Appearance: Normal appearance. He is not ill-appearing, toxic-appearing or diaphoretic.  HENT:     Head: Normocephalic  and atraumatic.     Right Ear: External ear normal.     Left Ear: External ear normal.  Eyes:     General: No scleral icterus.       Right eye: No discharge.        Left eye: No discharge.     Extraocular Movements: Extraocular movements intact.     Conjunctiva/sclera: Conjunctivae normal.  Cardiovascular:     Rate and Rhythm: Normal rate and regular rhythm.  Pulmonary:     Effort: Pulmonary effort is normal. No respiratory distress.     Breath sounds: No wheezing or rhonchi.  Skin:    General: Skin is warm and dry.  Neurological:     Mental Status: He is alert and oriented to person, place, and time.  Psychiatric:        Mood and Affect: Mood normal.        Behavior: Behavior normal.       No results found for any visits on 03/07/23.    The ASCVD Risk score (Arnett DK, et al., 2019) failed to calculate for the following reasons:   The 2019 ASCVD risk score is only valid for ages 9 to 56    Assessment & Plan:   Stage 3b chronic kidney disease (HCC) -     Basic metabolic panel -     Urinalysis, Routine w reflex microscopic -     Microalbumin / creatinine urine ratio  Essential hypertension -     Basic metabolic panel  Need for immunization against influenza -     Flu Vaccine Trivalent High Dose (Fluad)    Return in about 3 months (around 06/06/2023), or if symptoms worsen or fail to improve.  Has follow-up scheduled with nephrology next week and endocrinology next month.  Continue all medications.  Mliss Sax, MD

## 2023-03-13 ENCOUNTER — Other Ambulatory Visit: Payer: Self-pay | Admitting: Family Medicine

## 2023-03-13 DIAGNOSIS — I10 Essential (primary) hypertension: Secondary | ICD-10-CM

## 2023-03-23 DIAGNOSIS — E559 Vitamin D deficiency, unspecified: Secondary | ICD-10-CM | POA: Diagnosis not present

## 2023-03-23 DIAGNOSIS — C61 Malignant neoplasm of prostate: Secondary | ICD-10-CM | POA: Diagnosis not present

## 2023-03-23 DIAGNOSIS — N281 Cyst of kidney, acquired: Secondary | ICD-10-CM | POA: Diagnosis not present

## 2023-03-23 DIAGNOSIS — I129 Hypertensive chronic kidney disease with stage 1 through stage 4 chronic kidney disease, or unspecified chronic kidney disease: Secondary | ICD-10-CM | POA: Diagnosis not present

## 2023-03-23 DIAGNOSIS — N1832 Chronic kidney disease, stage 3b: Secondary | ICD-10-CM | POA: Diagnosis not present

## 2023-03-23 DIAGNOSIS — E1122 Type 2 diabetes mellitus with diabetic chronic kidney disease: Secondary | ICD-10-CM | POA: Diagnosis not present

## 2023-04-25 ENCOUNTER — Ambulatory Visit: Payer: Medicare HMO | Admitting: Internal Medicine

## 2023-04-25 DIAGNOSIS — H401131 Primary open-angle glaucoma, bilateral, mild stage: Secondary | ICD-10-CM | POA: Diagnosis not present

## 2023-04-25 DIAGNOSIS — E119 Type 2 diabetes mellitus without complications: Secondary | ICD-10-CM | POA: Diagnosis not present

## 2023-04-25 DIAGNOSIS — H2513 Age-related nuclear cataract, bilateral: Secondary | ICD-10-CM | POA: Diagnosis not present

## 2023-04-25 DIAGNOSIS — H04123 Dry eye syndrome of bilateral lacrimal glands: Secondary | ICD-10-CM | POA: Diagnosis not present

## 2023-04-25 NOTE — Progress Notes (Deleted)
Patient ID: John Fry, male   DOB: 1940-09-03, 82 y.o.   MRN: [id number]  HPI: John Fry is a 82 y.o.-year-old male, returning for follow-up for DM2, dx in 2015, insulin-dependent, uncontrolled, with complications (CKD). Pt. previously saw Dr. Everardo All.  He saw Dr. Everardo All for more than 20 years.  Last visit with me 4 months ago.  Interim history: No increased urination, blurry vision, nausea, chest pain.  Reviewed HbA1c: Lab Results  Component Value Date   HGBA1C 6.0 12/19/2022   HGBA1C 8.2 (A) 08/15/2022   HGBA1C 7.1 (H) 04/26/2022   HGBA1C 8.3 (A) 10/28/2020   HGBA1C 8.9 (A) 08/26/2020   HGBA1C 8.6 (A) 06/25/2020   HGBA1C 8.1 (A) 03/25/2020   HGBA1C 7.2 (A) 11/12/2019   HGBA1C 8.1 (A) 09/09/2019   HGBA1C 8.4 (A) 07/10/2019   Pt is on a regimen of: (vials) - NPH 30 >> 25 units at bedtime - R insulin 25-30 units twice a day 20-30 min before meals - Ozempic 0.25 mg weekly - ran out 3.5 mo ago and was not able to refill >> restarted: 0.5 mg weekly ($$: 300$ for 3 mo)  Pt checks his sugars 2x a day and they are: - am: 104-115 >> 106-115 - 2h after b'fast: n/c - before lunch: n/c - 2h after lunch: n/c >> up to 151 - before dinner: 105-110 >> 104-118 - 2h after dinner: 130 >> n/c - bedtime: n/c - nighttime: 95-110 >> 95 Lowest sugar was 85 (when active in the yard) >> 95; he has hypoglycemia awareness at 80s.  Highest sugar was 200 - not recently >> 151.  Glucometer: True Metrix  Pt's meals are: - Breakfast: 1-2 eggs + bacon + toast, milk - Lunch: sandwich, PB crackers - Dinner: meat + starch + veggie, but a lot of times: just veggies  - + CKD 3-4 - sees [facility] nephrology, last BUN/creatinine:  Lab Results  Component Value Date   BUN 22 03/07/2023   BUN 21 11/24/2022   CREATININE 1.92 (H) 03/07/2023   CREATININE 1.91 (H) 11/24/2022  He is not on ACE inhibitor/ARB.  He developed angioedema from lisinopril.  -+ HL; last set of lipids: Lab Results   Component Value Date   CHOL 132 11/24/2022   HDL 44.40 11/24/2022   LDLCALC 60 11/24/2022   LDLDIRECT 56.0 04/26/2022   TRIG 142.0 11/24/2022   CHOLHDL 3 11/24/2022  On Lipitor 40 mg daily.  - last eye exam was in 07/2022. No DR reportedly. + open-angle glaucoma - OS. + cataract - OD  - no numbness and tingling in his feet.  Last foot exam 08/15/2022.  He also has a history of hyperparathyroidism, unclear if related to his kidney dysfunction.  Lab Results  Component Value Date   PTH 63 04/06/2018   PTH 87 (H) 03/15/2017   PTH 107 (H) 11/02/2015   PTH 129 (H) 01/29/2015   PTH 111 (H) 10/09/2014   PTH 109 (H) 04/29/2014   PTH 55.5 10/28/2011   Lab Results  Component Value Date   CALCIUM 10.3 03/07/2023   CALCIUM 10.0 11/24/2022   CALCIUM 10.0 04/26/2022   CALCIUM 9.9 10/08/2021   CALCIUM 10.1 09/10/2020   CALCIUM 9.9 03/13/2020   CALCIUM 9.4 09/11/2019   CALCIUM 10.4 04/06/2018   CALCIUM 10.4 (H) 04/06/2018   CALCIUM 9.5 03/15/2017   CALCIUM 9.8 03/15/2017   Lab Results  Component Value Date   VD25OH 57.14 04/06/2018   VD25OH 50.16 03/15/2017   VD25OH 26.75 (  L) 11/02/2015   VD25OH 47.19 01/29/2015   VD25OH 19.92 (L) 10/09/2014   He has a history of HTN.  ROS: + see HPI  Past Medical History:  Diagnosis Date   Diabetes mellitus without complication (HCC)    DYSLIPIDEMIA 03/05/2007   Helicobacter pylori (H. 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   RENAL INSUFFICIENCY 05/22/2008   S/P radiation therapy 04/14/2014 through 06/11/2014                                                      Prostate 7800 cGy in 40 sessions, seminal vesicles 5600 cGy in 40 sessions                         SHOULDER PAIN, RIGHT 05/08/2008   VENTRICULAR HYPERTROPHY, LEFT 05/08/2008   Past Surgical History:  Procedure Laterality Date   FOOT SURGERY  1999   Right Foot Bunion Repair   Lower Arterial   05/06/2003   PROSTATE BIOPSY  07/23/13   gleason 7, volume 53 ml   remove fatty tumor Left 1995   thigh   Social History   Socioeconomic History   Marital status: Widowed    Spouse name: Not on file   Number of children: 2   Years of education: Not on file   Highest education level: Not on file  Occupational History    Comment: Retired  Tobacco Use   Smoking status: Former    Current packs/day: 0.00    Average packs/day: 0.5 packs/day for 20.0 years (10.0 ttl pk-yrs)    Types: Cigarettes    Start date: 06/20/1986    Quit date: 06/20/2006    Years since quitting: 16.8   Smokeless tobacco: Never  Substance and Sexual Activity   Alcohol use: No    Alcohol/week: 0.0 standard drinks of alcohol    Comment: quit 17 years ago   Drug use: No   Sexual activity: Not Currently  Other Topics Concern   Not on file  Social History Narrative   Lives alone   Social Determinants of Health   Financial Resource Strain: Low Risk  (07/15/2022)   Overall Financial Resource Strain (CARDIA)    Difficulty of Paying Living Expenses: Not hard at all  Food Insecurity: No Food Insecurity (07/15/2022)   Hunger Vital Sign    Worried About Running Out of Food in the Last Year: Never true    Ran Out of Food in the Last Year: Never true  Transportation Needs: No Transportation Needs (07/15/2022)   PRAPARE - Administrator, Civil Service (Medical): No    Lack of Transportation (Non-Medical): No  Physical Activity: Inactive (07/15/2022)   Exercise Vital Sign    Days of Exercise per Week: 0 days    Minutes of Exercise per Session: 0 min  Stress: No Stress Concern Present (07/15/2022)   [employer] of Occupational Health - Occupational Stress Questionnaire    Feeling of Stress : Not at all  Social Connections: Moderately Isolated (07/13/2021)   Social Connection and Isolation Panel [NHANES]    Frequency of Communication with Friends and Family: Three times a week    Frequency of Social  Gatherings with Friends and Family: Three  times a week    Attends Religious Services: More than 4 times per year    Active Member of Clubs or Organizations: No    Attends Banker Meetings: Never    Marital Status: Widowed  Intimate Partner Violence: Not At Risk (07/13/2021)   Humiliation, Afraid, Rape, and Kick questionnaire    Fear of Current or Ex-Partner: No    Emotionally Abused: No    Physically Abused: No    Sexually Abused: No   Current Outpatient Medications on File Prior to Visit  Medication Sig Dispense Refill   Alcohol Swabs (B-D SINGLE USE SWABS REGULAR) PADS USE  DAILY 200 each 0   amLODipine (NORVASC) 10 MG tablet TAKE 1 TABLET EVERY DAY 90 tablet 3   atorvastatin (LIPITOR) 40 MG tablet TAKE 1 TABLET EVERY DAY 90 tablet 3   Blood Glucose Monitoring Suppl (TRUE METRIX METER) w/Device KIT USE AS DIRECTED 1 kit 0   fluticasone (FLONASE) 50 MCG/ACT nasal spray Place 1 spray into both nostrils daily. 16 g 2   hydrALAZINE (APRESOLINE) 50 MG tablet TAKE 1 TABLET THREE TIMES DAILY 270 tablet 3   hydrochlorothiazide (HYDRODIURIL) 25 MG tablet TAKE 1 TABLET EVERY DAY 90 tablet 3   insulin NPH Human (NOVOLIN N) 100 UNIT/ML injection Inject 0.25 mLs (25 Units total) into the skin at bedtime. 20 mL 11   insulin regular (HUMULIN R) 100 units/mL injection Inject 0.25 mLs (25 Units total) into the skin 2 (two) times daily before a meal. 80 mL 11   lamoTRIgine (LAMICTAL) 100 MG tablet Take 1 tablet (100 mg total) by mouth 2 (two) times daily. 180 tablet 4   Semaglutide,0.25 or 0.5MG /DOS, (OZEMPIC, 0.25 OR 0.5 MG/DOSE,) 2 MG/1.5ML SOPN Inject 0.5 mg into the skin once a week. 9 mL 3   sildenafil (VIAGRA) 100 MG tablet Take 100 mg by mouth daily as needed.     TRUE METRIX BLOOD GLUCOSE TEST test strip CHECK BLOOD SUGAR TWICE DAILY (APPOINTMENT IS NEEDED FOR FURTHER REFILLS) 200 strip 0   No current facility-administered medications on file prior to visit.   Allergies  Allergen  Reactions   Zestril [Lisinopril]     angioedema   Family History  Problem Relation Age of Onset   Cancer Father 31       prostate   Kidney Stones Brother    Diabetes Sister    Hypertension Neg Hx    Colon cancer Neg Hx    PE: There were no vitals taken for this visit. Wt Readings from Last 3 Encounters:  03/07/23 203 lb 6.4 oz (92.3 kg)  12/19/22 212 lb 9.6 oz (96.4 kg)  11/24/22 210 lb 9.6 oz (95.5 kg)   Constitutional: overweight, in NAD Eyes:  EOMI, no exophthalmos ENT: no neck masses, no cervical lymphadenopathy Cardiovascular: RRR, No MRG Respiratory: CTA B Musculoskeletal: no deformities Skin:no rashes Neurological: + mild tremor with outstretched hands  ASSESSMENT: 1. DM2, insulin-dependent, uncontrolled, with complications - + CKD  2. HL  PLAN:  1. Patient with longstanding, uncontrolled, type 2 diabetes, on injectable antidiabetic regimen with intermediate acting insulin, short acting insulin and weekly GLP-1 receptor agonist, with improving control.  At last visit, HbA1c was the best in many years, at 6.0%.  We did not change his regimen at that time.  Sugars were all at goal.  At last visit, however, he felt that he may not afford Ozempic so I gave him the [employer] patient assistance program paperwork.  - I  suggested to:  Patient Instructions  Please continue: - NPH 25 units at bedtime - R insulin 20-25 units 2x a day 30 minutes before meals - Ozempic 0.5 mg weekly   Please return for another visit in 3-4 months.   - we checked his HbA1c: 7%  - advised to check sugars at different times of the day - 1x a day, rotating check times - advised for yearly eye exams >> he is UTD - return to clinic in 3-4 months  2. HL -Latest lipid panel from 11/2022 was reviewed: All fractions at goal: Lab Results  Component Value Date   CHOL 132 11/24/2022   HDL 44.40 11/24/2022   LDLCALC 60 11/24/2022   LDLDIRECT 56.0 04/26/2022   TRIG 142.0 11/24/2022    CHOLHDL 3 11/24/2022  -He continues Lipitor 40 mg daily without side effects  Carlus Pavlov, MD PhD [facility]

## 2023-05-15 ENCOUNTER — Other Ambulatory Visit: Payer: Self-pay | Admitting: Family Medicine

## 2023-06-06 ENCOUNTER — Ambulatory Visit: Payer: Medicare HMO | Admitting: Family Medicine

## 2023-06-06 ENCOUNTER — Telehealth: Payer: Self-pay | Admitting: Family Medicine

## 2023-06-06 NOTE — Telephone Encounter (Signed)
12.17.24 no show

## 2023-06-07 NOTE — Telephone Encounter (Signed)
02/24/2023 no show 06/06/2023 no show  Final warning sent via mail and sent text

## 2023-06-15 DIAGNOSIS — C61 Malignant neoplasm of prostate: Secondary | ICD-10-CM | POA: Diagnosis not present

## 2023-06-19 ENCOUNTER — Encounter: Payer: Self-pay | Admitting: Family Medicine

## 2023-06-19 ENCOUNTER — Ambulatory Visit (INDEPENDENT_AMBULATORY_CARE_PROVIDER_SITE_OTHER): Payer: Medicare HMO | Admitting: Family Medicine

## 2023-06-19 VITALS — BP 138/82 | HR 80 | Temp 98.3°F | Ht 67.0 in | Wt 214.0 lb

## 2023-06-19 DIAGNOSIS — N1832 Chronic kidney disease, stage 3b: Secondary | ICD-10-CM | POA: Diagnosis not present

## 2023-06-19 DIAGNOSIS — E1022 Type 1 diabetes mellitus with diabetic chronic kidney disease: Secondary | ICD-10-CM

## 2023-06-19 DIAGNOSIS — N184 Chronic kidney disease, stage 4 (severe): Secondary | ICD-10-CM | POA: Diagnosis not present

## 2023-06-19 DIAGNOSIS — E785 Hyperlipidemia, unspecified: Secondary | ICD-10-CM

## 2023-06-19 DIAGNOSIS — I10 Essential (primary) hypertension: Secondary | ICD-10-CM

## 2023-06-19 NOTE — Progress Notes (Signed)
Established Patient Office Visit   Subjective:  Patient ID: John Fry, male    DOB: 1941-01-04  Age: 82 y.o. MRN: [id number]  Chief Complaint  Patient presents with   Medical Management of Chronic Issues    3 month follow up. Pt is not fasting.     HPI Encounter Diagnoses  Name Primary?   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  Daughter up in the [place] is doing well after surgery but needs another 1.  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  Recent follow-up with nephrology reviewed.  He is due for follow-up with endocrinology.  He is seen ophthalmology every 3 months.   Review of Systems  Constitutional: Negative.   HENT: Negative.    Eyes:  Negative for blurred vision, discharge and redness.  Respiratory: Negative.    Cardiovascular: Negative.   Gastrointestinal:  Negative for abdominal pain.  Genitourinary: Negative.   Musculoskeletal: Negative.  Negative for myalgias.  Skin:  Negative for rash.  Neurological:  Negative for tingling, loss of consciousness and weakness.  Endo/Heme/Allergies:  Negative for polydipsia.     Current Outpatient Medications:    Alcohol Swabs (B-D SINGLE USE SWABS REGULAR) PADS, USE  DAILY, Disp: 200 each, Rfl: 0   amLODipine (NORVASC) 10 MG tablet, TAKE 1 TABLET EVERY DAY, Disp: 90 tablet, Rfl: 3   atorvastatin (LIPITOR) 40 MG tablet, TAKE 1 TABLET EVERY DAY, Disp: 90 tablet, Rfl: 3   Blood Glucose Monitoring Suppl (TRUE METRIX METER) w/Device KIT, USE AS DIRECTED, Disp: 1 kit, Rfl: 0   fluticasone (FLONASE) 50 MCG/ACT nasal spray, Place 1 spray into both nostrils daily., Disp: 16 g, Rfl: 2   hydrALAZINE (APRESOLINE) 50 MG tablet, TAKE 1 TABLET THREE TIMES DAILY, Disp: 270 tablet, Rfl: 3   hydrochlorothiazide  (HYDRODIURIL) 25 MG tablet, TAKE 1 TABLET EVERY DAY, Disp: 90 tablet, Rfl: 3   insulin NPH Human (NOVOLIN N) 100 UNIT/ML injection, Inject 0.25 mLs (25 Units total) into the skin at bedtime., Disp: 20 mL, Rfl: 11   insulin regular (HUMULIN R) 100 units/mL injection, Inject 0.25 mLs (25 Units total) into the skin 2 (two) times daily before a meal., Disp: 80 mL, Rfl: 11   lamoTRIgine (LAMICTAL) 100 MG tablet, Take 1 tablet (100 mg total) by mouth 2 (two) times daily., Disp: 180 tablet, Rfl: 4   sildenafil (VIAGRA) 100 MG tablet, Take 100 mg by mouth daily as needed., Disp: , Rfl:    TRUE METRIX BLOOD GLUCOSE TEST test strip, CHECK BLOOD SUGAR TWICE DAILY (APPOINTMENT IS NEEDED FOR FURTHER REFILLS), Disp: 200 strip, Rfl: 0   Semaglutide,0.25 or 0.5MG /DOS, (OZEMPIC, 0.25 OR 0.5 MG/DOSE,) 2 MG/1.5ML SOPN, Inject 0.5 mg into the skin once a week. (Patient not taking: Reported on 06/19/2023), Disp: 9 mL, Rfl: 3   Objective:     BP 138/82   Pulse 80   Temp 98.3 F (36.8 C)   Ht 5\' 7"  (1.702 m)   Wt 214 lb (97.1 kg)   SpO2 98%   BMI 33.52 kg/m    Physical Exam Constitutional:      General: He is not in acute distress.    Appearance: Normal appearance. He is not ill-appearing, toxic-appearing or diaphoretic.  HENT:  Head: Normocephalic and atraumatic.     Right Ear: External ear normal.     Left Ear: External ear normal.  Eyes:     General: No scleral icterus.       Right eye: No discharge.        Left eye: No discharge.     Extraocular Movements: Extraocular movements intact.     Conjunctiva/sclera: Conjunctivae normal.  Cardiovascular:     Rate and Rhythm: Normal rate and regular rhythm.  Pulmonary:     Effort: Pulmonary effort is normal. No respiratory distress.     Breath sounds: Normal breath sounds. No wheezing, rhonchi or rales.  Musculoskeletal:     Cervical back: No rigidity or tenderness.  Skin:    General: Skin is warm and dry.  Neurological:     Mental Status: He is  alert and oriented to person, place, and time.  Psychiatric:        Mood and Affect: Mood normal.        Behavior: Behavior normal.      No results found for any visits on 06/19/23.    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  Recommended follow-up with endocrinology as directed.  Labs have been stable.  Follow-up with me in 6 months.  Mliss Sax, MD

## 2023-06-29 ENCOUNTER — Other Ambulatory Visit: Payer: Self-pay | Admitting: Family Medicine

## 2023-07-03 DIAGNOSIS — N5201 Erectile dysfunction due to arterial insufficiency: Secondary | ICD-10-CM | POA: Diagnosis not present

## 2023-07-03 DIAGNOSIS — R350 Frequency of micturition: Secondary | ICD-10-CM | POA: Diagnosis not present

## 2023-07-03 DIAGNOSIS — C61 Malignant neoplasm of prostate: Secondary | ICD-10-CM | POA: Diagnosis not present

## 2023-08-23 ENCOUNTER — Other Ambulatory Visit: Payer: Self-pay

## 2023-08-23 MED ORDER — TRUE METRIX BLOOD GLUCOSE TEST VI STRP: ORAL_STRIP | 0 refills | Status: DC

## 2023-08-24 DIAGNOSIS — H401131 Primary open-angle glaucoma, bilateral, mild stage: Secondary | ICD-10-CM | POA: Diagnosis not present

## 2023-08-24 DIAGNOSIS — H2513 Age-related nuclear cataract, bilateral: Secondary | ICD-10-CM | POA: Diagnosis not present

## 2023-08-24 DIAGNOSIS — E119 Type 2 diabetes mellitus without complications: Secondary | ICD-10-CM | POA: Diagnosis not present

## 2023-09-19 ENCOUNTER — Ambulatory Visit (INDEPENDENT_AMBULATORY_CARE_PROVIDER_SITE_OTHER): Payer: Medicare HMO | Admitting: Diagnostic Neuroimaging

## 2023-09-19 ENCOUNTER — Encounter: Payer: Self-pay | Admitting: Diagnostic Neuroimaging

## 2023-09-19 VITALS — BP 152/78 | HR 83 | Ht 68.0 in | Wt 222.4 lb

## 2023-09-19 DIAGNOSIS — G44059 Short lasting unilateral neuralgiform headache with conjunctival injection and tearing (SUNCT), not intractable: Secondary | ICD-10-CM | POA: Diagnosis not present

## 2023-09-19 DIAGNOSIS — E236 Other disorders of pituitary gland: Secondary | ICD-10-CM

## 2023-09-19 MED ORDER — LAMOTRIGINE 100 MG PO TABS: 100.0000 mg | ORAL_TABLET | Freq: Two times a day (BID) | ORAL | 4 refills | Status: AC

## 2023-09-19 NOTE — Progress Notes (Signed)
 [facility]  PATIENT: John Fry DOB: 29-Oct-1940  REFERRING CLINICIAN: Mliss Sax,* HISTORY FROM: patient  REASON FOR VISIT: follow up   HISTORICAL  CHIEF COMPLAINT:  Chief Complaint  Patient presents with   Follow-up    Patient in room #7 and alone. Patient states he is well and stable with no new concerns.    HISTORY OF PRESENT ILLNESS:   UPDATE (09/19/23, VRP): Since last visit, doing well on LTG 100mg  twice a day. Symptoms are much improved. Did not get MRI done due to cost last year.   UPDATE (09/27/22, VRP): Since last visit, doing well until flare up 1 month ago of left face, maxillary pain. Tolerating lamotrigine 50mg  twice a day.   UPDATE (09/21/21, VRP): Since last visit, doing well. Symptoms are stable. No more HA pain. Tolerating LTG 50mg  twice a day.  UPDATE (03/24/2021, VRP): Since last visit, doing BETTER.  Lamotrigine seems to be helping symptoms. Sometimes when pain starts he places pressure on his left maxillary region and nose and this seems to symptoms.    PRIOR HPI (11/25/20, VRP): 83 year old male here for evaluation of left and facial pain.  Symptoms started 2021 with intermittent episodes of pain starting from the medial aspect of his left eye rating to his left maxillary region and left supraorbital region.  Symptoms last about 5 to 10 minutes at a time.  Sometimes his eye waters and he feels nasal congestion and develops redness in the eye.  Patient has been to ENT and had a CT maxillofacial scans which were unremarkable.  He has been treated.  With antibiotics which have not helped.  Symptoms have been almost on a daily basis or every other day.   REVIEW OF SYSTEMS: Full 14 system review of systems performed and negative with exception of: as per HPI.   ALLERGIES: Allergies  Allergen Reactions   Zestril [Lisinopril]     angioedema    HOME MEDICATIONS: Outpatient Medications Prior to Visit  Medication Sig  Dispense Refill   Alcohol Swabs (B-D SINGLE USE SWABS REGULAR) PADS USE  DAILY 200 each 0   amLODipine (NORVASC) 10 MG tablet TAKE 1 TABLET EVERY DAY 90 tablet 3   atorvastatin (LIPITOR) 40 MG tablet TAKE 1 TABLET EVERY DAY 90 tablet 3   Blood Glucose Monitoring Suppl (TRUE METRIX METER) w/Device KIT USE AS DIRECTED 1 kit 0   fluticasone (FLONASE) 50 MCG/ACT nasal spray Place 1 spray into both nostrils daily. 16 g 2   glucose blood (TRUE METRIX BLOOD GLUCOSE TEST) test strip Use as instructed 200 strip 0   hydrALAZINE (APRESOLINE) 50 MG tablet TAKE 1 TABLET THREE TIMES DAILY 270 tablet 3   hydrochlorothiazide (HYDRODIURIL) 25 MG tablet TAKE 1 TABLET EVERY DAY 90 tablet 3   insulin NPH Human (NOVOLIN N) 100 UNIT/ML injection Inject 0.25 mLs (25 Units total) into the skin at bedtime. 20 mL 11   insulin regular (HUMULIN R) 100 units/mL injection Inject 0.25 mLs (25 Units total) into the skin 2 (two) times daily before a meal. 80 mL 11   sildenafil (VIAGRA) 100 MG tablet Take 100 mg by mouth daily as needed.     lamoTRIgine (LAMICTAL) 100 MG tablet Take 1 tablet (100 mg total) by mouth 2 (two) times daily. 180 tablet 4   Semaglutide,0.25 or 0.5MG /DOS, (OZEMPIC, 0.25 OR 0.5 MG/DOSE,) 2 MG/1.5ML SOPN Inject 0.5 mg into the skin once a week. (Patient not taking: Reported on 09/19/2023) 9 mL 3  No facility-administered medications prior to visit.    PAST MEDICAL HISTORY: Past Medical History:  Diagnosis Date   Diabetes mellitus without complication (HCC)    DYSLIPIDEMIA 03/05/2007   Helicobacter pylori (H. 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   RENAL INSUFFICIENCY 05/22/2008   S/P radiation therapy 04/14/2014 through 06/11/2014                                                      Prostate 7800 cGy in 40 sessions, seminal vesicles 5600 cGy in 40 sessions                         SHOULDER PAIN, RIGHT 05/08/2008    VENTRICULAR HYPERTROPHY, LEFT 05/08/2008    PAST SURGICAL HISTORY: Past Surgical History:  Procedure Laterality Date   FOOT SURGERY  1999   Right Foot Bunion Repair   Lower Arterial  05/06/2003   PROSTATE BIOPSY  07/23/13   gleason 7, volume 53 ml   remove fatty tumor Left 1995   thigh    FAMILY HISTORY: Family History  Problem Relation Age of Onset   Cancer Father 84       prostate   Kidney Stones Brother    Diabetes Sister    Hypertension Neg Hx    Colon cancer Neg Hx     SOCIAL HISTORY: Social History   Socioeconomic History   Marital status: Widowed    Spouse name: Not on file   Number of children: 2   Years of education: Not on file   Highest education level: Not on file  Occupational History    Comment: Retired  Tobacco Use   Smoking status: Former    Current packs/day: 0.00    Average packs/day: 0.5 packs/day for 20.0 years (10.0 ttl pk-yrs)    Types: Cigarettes    Start date: 06/20/1986    Quit date: 06/20/2006    Years since quitting: 17.2   Smokeless tobacco: Never  Substance and Sexual Activity   Alcohol use: No    Alcohol/week: 0.0 standard drinks of alcohol    Comment: quit 17 years ago   Drug use: No   Sexual activity: Not Currently  Other Topics Concern   Not on file  Social History Narrative   Lives alone   Social Drivers of Health   Financial Resource Strain: Low Risk  (07/15/2022)   Overall Financial Resource Strain (CARDIA)    Difficulty of Paying Living Expenses: Not hard at all  Food Insecurity: No Food Insecurity (07/15/2022)   Hunger Vital Sign    Worried About Running Out of Food in the Last Year: Never true    Ran Out of Food in the Last Year: Never true  Transportation Needs: No Transportation Needs (07/15/2022)   PRAPARE - Administrator, Civil Service (Medical): No    Lack of Transportation (Non-Medical): No  Physical Activity: Inactive (07/15/2022)   Exercise Vital Sign    Days of Exercise per Week: 0 days     Minutes of Exercise per Session: 0 min  Stress: No Stress Concern Present (07/15/2022)   [employer] of Occupational Health - Occupational Stress Questionnaire    Feeling of Stress : Not at  all  Social Connections: Moderately Isolated (07/13/2021)   Social Connection and Isolation Panel [NHANES]    Frequency of Communication with Friends and Family: Three times a week    Frequency of Social Gatherings with Friends and Family: Three times a week    Attends Religious Services: More than 4 times per year    Active Member of Clubs or Organizations: No    Attends Banker Meetings: Never    Marital Status: Widowed  Intimate Partner Violence: Not At Risk (07/13/2021)   Humiliation, Afraid, Rape, and Kick questionnaire    Fear of Current or Ex-Partner: No    Emotionally Abused: No    Physically Abused: No    Sexually Abused: No     PHYSICAL EXAM  GENERAL EXAM/CONSTITUTIONAL: Vitals:  Vitals:   09/19/23 1554  BP: (!) 152/78  Pulse: 83  Weight: 222 lb 6.4 oz (100.9 kg)  Height: 5\' 8"  (1.727 m)    Body mass index is 33.82 kg/m. Wt Readings from Last 3 Encounters:  09/19/23 222 lb 6.4 oz (100.9 kg)  06/19/23 214 lb (97.1 kg)  03/07/23 203 lb 6.4 oz (92.3 kg)   Patient is in no distress; well developed, nourished and groomed; neck is supple  CARDIOVASCULAR: Examination of carotid arteries is normal; no carotid bruits Regular rate and rhythm, no murmurs Examination of peripheral vascular system by observation and palpation is normal  EYES: Ophthalmoscopic exam of optic discs and posterior segments is normal; no papilledema or hemorrhages; CONJUNCTIVAL INJECTION No results found.  MUSCULOSKELETAL: Gait, strength, tone, movements noted in Neurologic exam below  NEUROLOGIC: MENTAL STATUS:      No data to display         awake, alert, oriented to person, place and time recent and remote memory intact normal attention and concentration language fluent,  comprehension intact, naming intact fund of knowledge appropriate  CRANIAL NERVE:  2nd - no papilledema on fundoscopic exam 2nd, 3rd, 4th, 6th - pupils equal and reactive to light, visual fields full to confrontation, extraocular muscles intact, no nystagmus 5th - facial sensation symmetric 7th - facial strength symmetric 8th - hearing intact 9th - palate elevates symmetrically, uvula midline 11th - shoulder shrug symmetric 12th - tongue protrusion midline  MOTOR:  normal bulk and tone, full strength in the BUE, BLE  SENSORY:  normal and symmetric to light touch, temperature, vibration  COORDINATION:  finger-nose-finger, fine finger movements normal  REFLEXES:  deep tendon reflexes present and symmetric  GAIT/STATION:  narrow based gait     DIAGNOSTIC DATA (LABS, IMAGING, TESTING) - I reviewed patient records, labs, notes, testing and imaging myself where available.  Lab Results  Component Value Date   WBC 7.7 11/24/2022   HGB 14.2 11/24/2022   HCT 43.4 11/24/2022   MCV 95.6 11/24/2022   PLT 261.0 11/24/2022      Component Value Date/Time   NA 138 03/07/2023 1413   K 4.6 03/07/2023 1413   CL 103 03/07/2023 1413   CO2 27 03/07/2023 1413   GLUCOSE 71 03/07/2023 1413   BUN 22 03/07/2023 1413   CREATININE 1.92 (H) 03/07/2023 1413   CREATININE 2.64 (H) 10/08/2021 1653   CALCIUM 10.3 03/07/2023 1413   CALCIUM 10.0 10/28/2011 1124   PROT 7.5 11/24/2022 1054   ALBUMIN 4.9 11/24/2022 1054   AST 22 11/24/2022 1054   ALT 21 11/24/2022 1054   ALKPHOS 68 11/24/2022 1054   BILITOT 0.5 11/24/2022 1054   GFRNONAA 40 (L) 09/27/2013 4540  GFRAA 46 (L) 09/27/2013 0835   Lab Results  Component Value Date   CHOL 132 11/24/2022   HDL 44.40 11/24/2022   LDLCALC 60 11/24/2022   LDLDIRECT 56.0 04/26/2022   TRIG 142.0 11/24/2022   CHOLHDL 3 11/24/2022   Lab Results  Component Value Date   HGBA1C 6.0 12/19/2022   No results found for: "VITAMINB12" Lab Results   Component Value Date   TSH 1.68 04/06/2018    12/10/20 MRI brain  MRI of the brain with and without contrast shows the following: 1.   Although the left trigeminal nerve has normal signal, there are adjacent blood vessels near the dorsal root entry zone.  Unclear if this could contribute to facial pain. 2.   6 x 8 mm nonenhancing mass in the pituitary gland.  It could represent a Rathke cleft cyst, other cyst or less likely a microadenoma. 3.   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 4.   No acute findings.    ASSESSMENT AND PLAN  83 y.o. year old male here with intermittent left facial pain with signs and symptoms consistent with SUNCT, since 2021.    Dx:  1. SUNCT (short unilateral neuralgiform headache, conjunctival inj/tear)   2. 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    Dispense:  180 tablet    Refill:  4   Orders Placed This Encounter  Procedures   MR BRAIN W WO CONTRAST   Return in about 1 year (around 09/18/2024).    Suanne Marker, MD 09/19/2023, 4:01 PM Certified in Neurology, Neurophysiology and Neuroimaging  [facility] [address] [place], [place] [address] [phone]

## 2023-10-03 ENCOUNTER — Ambulatory Visit: Payer: Medicare HMO | Admitting: Diagnostic Neuroimaging

## 2023-10-10 ENCOUNTER — Telehealth: Payer: Self-pay

## 2023-10-10 NOTE — Telephone Encounter (Addendum)
 Patient was identified as falling into the [place] Measure - Diabetes.   Patient was: Left voicemail to schedule with primary care provider. Dr. Tilmon Font MD.   Last OV 06/19/2023 Last A1c 12/19/2022 6.0  Follow up appointment 6 months (around 12/18/2023)

## 2023-10-16 ENCOUNTER — Telehealth: Payer: Self-pay | Admitting: Diagnostic Neuroimaging

## 2023-10-16 ENCOUNTER — Other Ambulatory Visit: Payer: Self-pay | Admitting: Family Medicine

## 2023-10-16 NOTE — Telephone Encounter (Signed)
 John Fry Siegfried Dress: [id number] exp. 10/16/23-12/15/23 scheduled at GI 5/10.

## 2023-10-28 ENCOUNTER — Ambulatory Visit
Admission: RE | Admit: 2023-10-28 | Discharge: 2023-10-28 | Disposition: A | Discharge status: Home / Self Care | Source: Ambulatory Visit | Attending: Diagnostic Neuroimaging | Admitting: Diagnostic Neuroimaging

## 2023-10-28 DIAGNOSIS — E236 Other disorders of pituitary gland: Secondary | ICD-10-CM

## 2023-10-28 MED ORDER — GADOPICLENOL 0.5 MMOL/ML IV SOLN
10.0000 mL | Freq: Once | INTRAVENOUS | Status: AC | PRN
  Administered 2023-10-28: 10 mL via INTRAVENOUS

## 2023-11-14 DIAGNOSIS — N1832 Chronic kidney disease, stage 3b: Secondary | ICD-10-CM | POA: Diagnosis not present

## 2023-11-22 ENCOUNTER — Ambulatory Visit: Payer: Self-pay | Admitting: Diagnostic Neuroimaging

## 2023-11-23 DIAGNOSIS — E559 Vitamin D deficiency, unspecified: Secondary | ICD-10-CM | POA: Diagnosis not present

## 2023-11-23 DIAGNOSIS — I129 Hypertensive chronic kidney disease with stage 1 through stage 4 chronic kidney disease, or unspecified chronic kidney disease: Secondary | ICD-10-CM | POA: Diagnosis not present

## 2023-11-23 DIAGNOSIS — N1832 Chronic kidney disease, stage 3b: Secondary | ICD-10-CM | POA: Diagnosis not present

## 2023-11-23 DIAGNOSIS — N281 Cyst of kidney, acquired: Secondary | ICD-10-CM | POA: Diagnosis not present

## 2023-11-23 DIAGNOSIS — E1122 Type 2 diabetes mellitus with diabetic chronic kidney disease: Secondary | ICD-10-CM | POA: Diagnosis not present

## 2023-11-29 NOTE — Telephone Encounter (Signed)
 Called pt and discussed MRI results as noted below. Pt's questions were answered. Informed pt that if he has any change in symptoms (currently he reports no changes since last OV) please call us . For any severe concerning symptoms please call 911. He verbalized understanding and appreciation for the call.

## 2023-11-29 NOTE — Telephone Encounter (Signed)
-----   Message from [employer] sent at 11/22/2023  2:27 PM EDT ----- Pituitary finding likely benign (Rathke cleft cyst), slightly enlarged compared to 2022. No need for treatment of this.  May repeat scan in 1 to 2 years. -VRP

## 2023-12-28 DIAGNOSIS — E119 Type 2 diabetes mellitus without complications: Secondary | ICD-10-CM | POA: Diagnosis not present

## 2023-12-28 DIAGNOSIS — H2513 Age-related nuclear cataract, bilateral: Secondary | ICD-10-CM | POA: Diagnosis not present

## 2023-12-28 DIAGNOSIS — H04123 Dry eye syndrome of bilateral lacrimal glands: Secondary | ICD-10-CM | POA: Diagnosis not present

## 2023-12-28 DIAGNOSIS — H401131 Primary open-angle glaucoma, bilateral, mild stage: Secondary | ICD-10-CM | POA: Diagnosis not present

## 2024-03-03 ENCOUNTER — Other Ambulatory Visit: Payer: Self-pay | Admitting: Family Medicine

## 2024-03-03 DIAGNOSIS — I10 Essential (primary) hypertension: Secondary | ICD-10-CM

## 2024-04-19 DIAGNOSIS — Z794 Long term (current) use of insulin: Secondary | ICD-10-CM | POA: Diagnosis not present

## 2024-04-19 DIAGNOSIS — E1122 Type 2 diabetes mellitus with diabetic chronic kidney disease: Secondary | ICD-10-CM | POA: Diagnosis not present

## 2024-04-19 DIAGNOSIS — H409 Unspecified glaucoma: Secondary | ICD-10-CM | POA: Diagnosis not present

## 2024-04-19 DIAGNOSIS — E669 Obesity, unspecified: Secondary | ICD-10-CM | POA: Diagnosis not present

## 2024-04-19 DIAGNOSIS — C61 Malignant neoplasm of prostate: Secondary | ICD-10-CM | POA: Diagnosis not present

## 2024-04-19 DIAGNOSIS — E785 Hyperlipidemia, unspecified: Secondary | ICD-10-CM | POA: Diagnosis not present

## 2024-04-19 DIAGNOSIS — E1142 Type 2 diabetes mellitus with diabetic polyneuropathy: Secondary | ICD-10-CM | POA: Diagnosis not present

## 2024-04-19 DIAGNOSIS — N189 Chronic kidney disease, unspecified: Secondary | ICD-10-CM | POA: Diagnosis not present

## 2024-04-19 DIAGNOSIS — I129 Hypertensive chronic kidney disease with stage 1 through stage 4 chronic kidney disease, or unspecified chronic kidney disease: Secondary | ICD-10-CM | POA: Diagnosis not present

## 2024-05-02 DIAGNOSIS — H35033 Hypertensive retinopathy, bilateral: Secondary | ICD-10-CM | POA: Diagnosis not present

## 2024-05-02 DIAGNOSIS — H401131 Primary open-angle glaucoma, bilateral, mild stage: Secondary | ICD-10-CM | POA: Diagnosis not present

## 2024-05-02 DIAGNOSIS — H04123 Dry eye syndrome of bilateral lacrimal glands: Secondary | ICD-10-CM | POA: Diagnosis not present

## 2024-05-02 DIAGNOSIS — H2513 Age-related nuclear cataract, bilateral: Secondary | ICD-10-CM | POA: Diagnosis not present

## 2024-05-02 DIAGNOSIS — E119 Type 2 diabetes mellitus without complications: Secondary | ICD-10-CM | POA: Diagnosis not present

## 2024-05-02 LAB — OPHTHALMOLOGY REPORT-SCANNED

## 2024-05-06 ENCOUNTER — Other Ambulatory Visit: Payer: Self-pay | Admitting: Family Medicine

## 2024-05-13 DIAGNOSIS — N1832 Chronic kidney disease, stage 3b: Secondary | ICD-10-CM | POA: Diagnosis not present

## 2024-05-24 ENCOUNTER — Ambulatory Visit

## 2024-05-24 ENCOUNTER — Encounter: Admitting: Family Medicine

## 2024-05-24 DIAGNOSIS — N1832 Chronic kidney disease, stage 3b: Secondary | ICD-10-CM | POA: Diagnosis not present

## 2024-05-24 DIAGNOSIS — C61 Malignant neoplasm of prostate: Secondary | ICD-10-CM | POA: Diagnosis not present

## 2024-05-24 DIAGNOSIS — E559 Vitamin D deficiency, unspecified: Secondary | ICD-10-CM | POA: Diagnosis not present

## 2024-05-24 DIAGNOSIS — E1122 Type 2 diabetes mellitus with diabetic chronic kidney disease: Secondary | ICD-10-CM | POA: Diagnosis not present

## 2024-05-24 DIAGNOSIS — I129 Hypertensive chronic kidney disease with stage 1 through stage 4 chronic kidney disease, or unspecified chronic kidney disease: Secondary | ICD-10-CM | POA: Diagnosis not present

## 2024-05-24 DIAGNOSIS — N281 Cyst of kidney, acquired: Secondary | ICD-10-CM | POA: Diagnosis not present

## 2024-07-16 ENCOUNTER — Ambulatory Visit

## 2024-07-16 VITALS — Ht 68.0 in | Wt 212.0 lb

## 2024-07-16 DIAGNOSIS — Z0000 Encounter for general adult medical examination without abnormal findings: Secondary | ICD-10-CM | POA: Diagnosis not present

## 2024-07-16 NOTE — Progress Notes (Signed)
 "  Chief Complaint  Patient presents with   Medicare Wellness     Subjective:   John Fry is a 84 y.o. male who presents for a Medicare Annual Wellness Visit.  Visit info / Clinical Intake: Medicare Wellness Visit Type:: Subsequent Annual Wellness Visit Persons participating in visit and providing information:: patient Medicare Wellness Visit Mode:: Telephone If telephone:: video error Since this visit was completed virtually, some vitals may be partially provided or unavailable. Missing vitals are due to the limitations of the virtual format.: Documented vitals are patient reported If Telephone or Video please confirm:: I connected with patient using audio/video enable telemedicine. I verified patient identity with two identifiers, discussed telehealth limitations, and patient agreed to proceed. Patient Location:: home Provider Location:: home office Interpreter Needed?: No Pre-visit prep was completed: yes AWV questionnaire completed by patient prior to visit?: no Living arrangements:: with family/others Patient's Overall Health Status Rating: good Typical amount of pain: some Does pain affect daily life?: no Are you currently prescribed opioids?: no  Dietary Habits and Nutritional Risks How many meals a day?: 2 Eats fruit and vegetables daily?: yes Most meals are obtained by: preparing own meals In the last 2 weeks, have you had any of the following?: none Diabetic:: (!) yes Any non-healing wounds?: no How often do you check your BS?: 2 Would you like to be referred to a Nutritionist or for Diabetic Management? : no  Functional Status Activities of Daily Living (to include ambulation/medication): Independent Ambulation: Independent Medication Administration: Independent Home Management (perform basic housework or laundry): Independent Manage your own finances?: yes Primary transportation is: family / friends Concerns about vision?: (!) yes (has cataract in  right eye, glaucoma left eye) Concerns about hearing?: no  Fall Screening Falls in the past year?: 0 Number of falls in past year: 0 Was there an injury with Fall?: 0 Fall Risk Category Calculator: 0 Patient Fall Risk Level: Low Fall Risk  Fall Risk Patient at Risk for Falls Due to: Medication side effect; Impaired vision Fall risk Follow up: Falls prevention discussed; Education provided; Falls evaluation completed  Home and Transportation Safety: All rugs have non-skid backing?: N/A, no rugs All stairs or steps have railings?: yes Grab bars in the bathtub or shower?: (!) no Have non-skid surface in bathtub or shower?: yes Good home lighting?: yes Regular seat belt use?: yes Hospital stays in the last year:: no  Cognitive Assessment Difficulty concentrating, remembering, or making decisions? : no Will 6CIT or Mini Cog be Completed: yes What year is it?: 0 points What month is it?: 0 points Give patient an address phrase to remember (5 components): [address] [place] About what time is it?: 0 points Count backwards from 20 to 1: 0 points Say the months of the year in reverse: 2 points Repeat the address phrase from earlier: 0 points 6 CIT Score: 2 points  Advance Directives (For Healthcare) Does Patient Have a Medical Advance Directive?: No Would patient like information on creating a medical advance directive?: No - Patient declined  Reviewed/Updated  Reviewed/Updated: Reviewed All (Medical, Surgical, Family, Medications, Allergies, Care Teams, Patient Goals)    Allergies (verified) Zestril  [lisinopril ]   Current Medications (verified) Outpatient Encounter Medications as of 07/16/2024  Medication Sig   Alcohol  Swabs (B-D SINGLE USE SWABS REGULAR) PADS USE  DAILY   amLODipine  (NORVASC ) 10 MG tablet TAKE 1 TABLET EVERY DAY   atorvastatin  (LIPITOR) 40 MG tablet TAKE 1 TABLET EVERY DAY   Blood Glucose  Monitoring Suppl (TRUE METRIX METER) w/Device KIT USE AS  DIRECTED   fluticasone  (FLONASE ) 50 MCG/ACT nasal spray Place 1 spray into both nostrils daily.   glucose blood (TRUE METRIX BLOOD GLUCOSE TEST) test strip Test blood sugar bid   hydrALAZINE  (APRESOLINE ) 50 MG tablet TAKE 1 TABLET THREE TIMES DAILY   hydrochlorothiazide  (HYDRODIURIL ) 25 MG tablet TAKE 1 TABLET EVERY DAY   insulin  NPH Human (NOVOLIN N) 100 UNIT/ML injection Inject 0.25 mLs (25 Units total) into the skin at bedtime.   insulin  regular (HUMULIN R ) 100 units/mL injection Inject 0.25 mLs (25 Units total) into the skin 2 (two) times daily before a meal.   lamoTRIgine  (LAMICTAL ) 100 MG tablet Take 1 tablet (100 mg total) by mouth 2 (two) times daily.   sildenafil (VIAGRA) 100 MG tablet Take 100 mg by mouth daily as needed. (Patient not taking: Reported on 07/16/2024)   No facility-administered encounter medications on file as of 07/16/2024.    History: Past Medical History:  Diagnosis Date   Diabetes mellitus without complication (HCC)    DYSLIPIDEMIA 03/05/2007   Helicobacter pylori (H. 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   RENAL INSUFFICIENCY 05/22/2008   S/P radiation therapy 04/14/2014 through 06/11/2014                                                      Prostate 7800 cGy in 40 sessions, seminal vesicles 5600 cGy in 40 sessions                         SHOULDER PAIN, RIGHT 05/08/2008   VENTRICULAR HYPERTROPHY, LEFT 05/08/2008   Past Surgical History:  Procedure Laterality Date   FOOT SURGERY  1999   Right Foot Bunion Repair   Lower Arterial  05/06/2003   PROSTATE BIOPSY  07/23/13   gleason 7, volume 53 ml   remove fatty tumor Left 1995   thigh   Family History  Problem Relation Age of Onset   Cancer Father 27       prostate   Kidney Stones Brother    Diabetes Sister    Hypertension Neg Hx    Colon cancer Neg Hx    Social History   Occupational History    Comment: Retired   Tobacco Use   Smoking status: Former    Current packs/day: 0.00    Average packs/day: 0.5 packs/day for 20.0 years (10.0 ttl pk-yrs)    Types: Cigarettes    Start date: 06/20/1986    Quit date: 06/20/2006    Years since quitting: 18.0   Smokeless tobacco: Never  Vaping Use   Vaping status: Never Used  Substance and Sexual Activity   Alcohol  use: No    Alcohol /week: 0.0 standard drinks of alcohol     Comment: quit 17 years ago   Drug use: No   Sexual activity: Not Currently   Tobacco Counseling Counseling given: Not Answered  SDOH Screenings   Food Insecurity: No Food Insecurity (07/16/2024)  Housing: Unknown (07/16/2024)  Transportation Needs: No Transportation Needs (07/16/2024)  Utilities: Not At Risk (07/16/2024)  Alcohol  Screen: Low Risk (07/16/2024)  Depression (PHQ2-9): Low Risk (07/16/2024)  Financial Resource Strain: Low Risk (07/16/2024)  Physical Activity: Insufficiently Active (07/16/2024)  Social Connections: Moderately Isolated (07/16/2024)  Stress: No Stress Concern Present (07/16/2024)  Tobacco Use: Medium Risk (07/16/2024)  Health Literacy: Adequate Health Literacy (07/16/2024)   See flowsheets for full screening details  Depression Screen PHQ 2 & 9 Depression Scale- Over the past 2 weeks, how often have you been bothered by any of the following problems? Little interest or pleasure in doing things: 0 Feeling down, depressed, or hopeless (PHQ Adolescent also includes...irritable): 0 PHQ-2 Total Score: 0 Trouble falling or staying asleep, or sleeping too much: 0 Feeling tired or having little energy: 0 Poor appetite or overeating (PHQ Adolescent also includes...weight loss): 0 Feeling bad about yourself - or that you are a failure or have let yourself or your family down: 0 Trouble concentrating on things, such as reading the newspaper or watching television (PHQ Adolescent also includes...like school work): 0 Moving or speaking so slowly that other people could have  noticed. Or the opposite - being so fidgety or restless that you have been moving around a lot more than usual: 0 Thoughts that you would be better off dead, or of hurting yourself in some way: 0 PHQ-9 Total Score: 0 If you checked off any problems, how difficult have these problems made it for you to do your work, take care of things at home, or get along with other people?: Not difficult at all  Depression Treatment Depression Interventions/Treatment : [id number] Score <4 Follow-up Not Indicated     Goals Addressed               This Visit's Progress     statart walking again during the spirng (pt-stated)               Objective:    Today's Vitals   07/16/24 1009  Weight: 212 lb (96.2 kg)  Height: 5' 8 (1.727 m)   Body mass index is 32.23 kg/m.  Hearing/Vision screen Vision Screening - Comments:: Regular eye exams, Dr. Cyrus Immunizations and Health Maintenance Health Maintenance  Topic Date Due   Diabetic kidney evaluation - Urine ACR  10/09/2022   HEMOGLOBIN A1C  06/21/2023   FOOT EXAM  08/16/2023   Influenza Vaccine  01/19/2024   Diabetic kidney evaluation - eGFR measurement  03/06/2024   DTaP/Tdap/Td (2 - Td or Tdap) 07/10/2024   OPHTHALMOLOGY EXAM  05/02/2025   Medicare Annual Wellness (AWV)  07/16/2025   Pneumococcal Vaccine: 50+ Years  Completed   Meningococcal B Vaccine  Aged Out   Colonoscopy  Discontinued   COVID-19 Vaccine  Discontinued   Zoster Vaccines- Shingrix  Discontinued        Assessment/Plan:  This is a routine wellness examination for Elsie.  Patient Care Team: Berneta Elsie Sayre, MD as PCP - General (Family Medicine) Cyrus Carwin, MD as Consulting Physician (Ophthalmology)  I have personally reviewed and noted the following in the patients chart:   Medical and social history Use of alcohol , tobacco or illicit drugs  Current medications and supplements including opioid prescriptions. Functional ability and  status Nutritional status Physical activity Advanced directives List of other physicians Hospitalizations, surgeries, and ER visits in previous 12 months Vitals Screenings to include cognitive, depression, and falls Referrals and appointments  No orders of the defined types were placed in this encounter.  In addition, I have reviewed and discussed with patient certain preventive protocols, quality metrics, and best practice recommendations. A written personalized care plan for preventive services as well as general preventive health recommendations were provided to patient.  Ardella FORBES Dawn, LPN   [id number]/72/7973   Return in 1 year (on 07/16/2025).  After Visit Summary: (Pick Up) Due to this being a telephonic visit, with patients personalized plan was offered to patient and patient has requested to Pick up at office.  Nurse Notes: HM Addressed: Vaccines Due: flu and TDAP Diabetic Foot Exam recommended Labs Due A1C, UACR, eGFR  "

## 2024-07-16 NOTE — Patient Instructions (Signed)
 John Fry,  Thank you for taking the time for your Medicare Wellness Visit. I appreciate your continued commitment to your health goals. Please review the care plan we discussed, and feel free to reach out if I can assist you further.  Please note that Annual Wellness Visits do not include a physical exam. Some assessments may be limited, especially if the visit was conducted virtually. If needed, we may recommend an in-person follow-up with your provider.  Ongoing Care Seeing your primary care provider every 3 to 6 months helps us  monitor your health and provide consistent, personalized care.  Referrals If a referral was made during today's visit and you haven't received any updates within two weeks, please contact the referred provider directly to check on the status.  Recommended Screenings:  Health Maintenance  Topic Date Due   Kidney health urinalysis for diabetes  10/09/2022   Hemoglobin A1C  06/21/2023   Medicare Annual Wellness Visit  07/16/2023   Complete foot exam   08/16/2023   Flu Shot  01/19/2024   Yearly kidney function blood test for diabetes  03/06/2024   DTaP/Tdap/Td vaccine (2 - Td or Tdap) 07/10/2024   Eye exam for diabetics  05/02/2025   Pneumococcal Vaccine for age over 52  Completed   Meningitis B Vaccine  Aged Out   Colon Cancer Screening  Discontinued   COVID-19 Vaccine  Discontinued   Zoster (Shingles) Vaccine  Discontinued       07/16/2024   10:16 AM  Advanced Directives  Does Patient Have a Medical Advance Directive? No  Would patient like information on creating a medical advance directive? No - Patient declined    Vision: Annual vision screenings are recommended for early detection of glaucoma, cataracts, and diabetic retinopathy. These exams can also reveal signs of chronic conditions such as diabetes and high blood pressure.  Dental: Annual dental screenings help detect early signs of oral cancer, gum disease, and other conditions linked to  overall health, including heart disease and diabetes.  Please see the attached documents for additional preventive care recommendations.

## 2024-07-19 ENCOUNTER — Ambulatory Visit: Admitting: Family Medicine

## 2024-07-19 ENCOUNTER — Encounter: Payer: Self-pay | Admitting: Family Medicine

## 2024-07-19 VITALS — BP 138/80 | HR 84 | Temp 98.5°F | Ht 68.0 in | Wt 223.2 lb

## 2024-07-19 DIAGNOSIS — E1022 Type 1 diabetes mellitus with diabetic chronic kidney disease: Secondary | ICD-10-CM | POA: Diagnosis not present

## 2024-07-19 DIAGNOSIS — E785 Hyperlipidemia, unspecified: Secondary | ICD-10-CM

## 2024-07-19 DIAGNOSIS — Z23 Encounter for immunization: Secondary | ICD-10-CM | POA: Diagnosis not present

## 2024-07-19 DIAGNOSIS — N184 Chronic kidney disease, stage 4 (severe): Secondary | ICD-10-CM

## 2024-07-19 DIAGNOSIS — I10 Essential (primary) hypertension: Secondary | ICD-10-CM | POA: Diagnosis not present

## 2024-07-19 DIAGNOSIS — N1832 Chronic kidney disease, stage 3b: Secondary | ICD-10-CM

## 2024-07-19 LAB — CBC
HCT: 40.6 % (ref 39.0–52.0)
Hemoglobin: 13.8 g/dL (ref 13.0–17.0)
MCHC: 33.9 g/dL (ref 30.0–36.0)
MCV: 92.9 fl (ref 78.0–100.0)
Platelets: 279 10*3/uL (ref 150.0–400.0)
RBC: 4.38 Mil/uL (ref 4.22–5.81)
RDW: 13.8 % (ref 11.5–15.5)
WBC: 10.6 10*3/uL — ABNORMAL HIGH (ref 4.0–10.5)

## 2024-07-19 LAB — COMPREHENSIVE METABOLIC PANEL WITH GFR
ALT: 23 U/L (ref 3–53)
AST: 19 U/L (ref 5–37)
Albumin: 4.9 g/dL (ref 3.5–5.2)
Alkaline Phosphatase: 77 U/L (ref 39–117)
BUN: 24 mg/dL — ABNORMAL HIGH (ref 6–23)
CO2: 27 meq/L (ref 19–32)
Calcium: 10.2 mg/dL (ref 8.4–10.5)
Chloride: 101 meq/L (ref 96–112)
Creatinine, Ser: 2.35 mg/dL — ABNORMAL HIGH (ref 0.40–1.50)
GFR: 24.85 mL/min — ABNORMAL LOW
Glucose, Bld: 176 mg/dL — ABNORMAL HIGH (ref 70–99)
Potassium: 3.7 meq/L (ref 3.5–5.1)
Sodium: 138 meq/L (ref 135–145)
Total Bilirubin: 0.4 mg/dL (ref 0.2–1.2)
Total Protein: 7.7 g/dL (ref 6.0–8.3)

## 2024-07-19 LAB — LDL CHOLESTEROL, DIRECT: Direct LDL: 67 mg/dL

## 2024-07-19 LAB — HEMOGLOBIN A1C: Hgb A1c MFr Bld: 10 % — ABNORMAL HIGH (ref 4.6–6.5)

## 2024-07-19 NOTE — Progress Notes (Signed)
 "  Established Patient Office Visit   Subjective:  Patient ID: John Fry, male    DOB: 1941/06/03  Age: 84 y.o. MRN: [id number]  Chief Complaint  Patient presents with   Annual Exam    HPI Encounter Diagnoses  Name Primary?   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  Continues to live independently.  Brother who had lived in [place] committed suicide recently.  He has been handling the estate.  Continues to help his daughter up in the [place].  Blood pressure at home has been running in the 130s over 70s.  Continues with amlodipine  10 mg hydralazine  50 mg 3 times daily and HCTZ 25 mg.  Continues follow-up with nephrology.  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  Has cataract in OD.  Ongoing follow-up with endocrinology for type 1 diabetes.   Review of Systems  Constitutional: Negative.   HENT: Negative.    Eyes:  Negative for blurred vision, discharge and redness.  Respiratory: Negative.    Cardiovascular: Negative.   Gastrointestinal:  Negative for abdominal pain.  Genitourinary: Negative.   Musculoskeletal: Negative.  Negative for myalgias.  Skin:  Negative for rash.  Neurological:  Negative for tingling, loss of consciousness and weakness.  Endo/Heme/Allergies:  Negative for polydipsia.    Current Medications[1]   Objective:     BP 138/80   Pulse 84   Temp 98.5 F (36.9 C)   Ht 5' 8 (1.727 m)   Wt 223 lb 3.2 oz (101.2 kg)   SpO2 98%   BMI 33.94 kg/m  BP Readings from Last 3 Encounters:  07/19/24 138/80  09/19/23 (!) 152/78  06/19/23 138/82   Wt Readings from Last 3 Encounters:  07/19/24 223 lb 3.2 oz (101.2 kg)  07/16/24 212 lb (96.2 kg)  09/19/23 222 lb 6.4 oz (100.9 kg)      Physical Exam Constitutional:      General: He is not in acute distress.     Appearance: Normal appearance. He is not ill-appearing, toxic-appearing or diaphoretic.  HENT:     Head: Normocephalic and atraumatic.     Right Ear: Tympanic membrane, ear canal and external ear normal.     Left Ear: Tympanic membrane, ear canal and external ear normal.     Mouth/Throat:     Mouth: Mucous membranes are moist.     Pharynx: Oropharynx is clear. No oropharyngeal exudate or posterior oropharyngeal erythema.  Eyes:     General: No scleral icterus.       Right eye: No discharge.        Left eye: No discharge.     Extraocular Movements: Extraocular movements intact.     Conjunctiva/sclera: Conjunctivae normal.     Pupils: Pupils are equal, round, and reactive to light.  Cardiovascular:     Rate and Rhythm: Normal rate and regular rhythm.     Pulses:          Dorsalis pedis pulses are 2+ on the right side and 2+ on the left side.       Posterior tibial pulses are 2+ on the right side and 2+ on the left side.  Pulmonary:     Effort: Pulmonary effort is normal. No respiratory distress.     Breath sounds: Normal breath sounds.  Abdominal:     General: Bowel sounds are normal.     Tenderness: There is no abdominal tenderness. There is no guarding.  Musculoskeletal:     Cervical back: No rigidity or tenderness.  Skin:    General: Skin is warm and dry.  Neurological:     Mental Status: He is alert and oriented to person, place, and time.  Psychiatric:        Mood and Affect: Mood normal.        Behavior: Behavior normal.    Diabetic Foot Exam - Simple   Simple Foot Form Diabetic Foot exam was performed with the following findings: Yes 07/19/2024 10:29 AM  Visual Inspection See comments: Yes Sensation Testing Intact to touch and monofilament testing bilaterally: Yes Pulse Check Posterior Tibialis and Dorsalis pulse intact bilaterally: Yes Comments Xerosis of both feet.  There are no lesions or ulcerations.       No results found for any visits on  07/19/24.    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  Continue follow-up with endocrinology, nephrology and ophthalmology.  Elsie Sim Lent, MD    [1]  Current Outpatient Medications:    Alcohol  Swabs (B-D SINGLE USE SWABS REGULAR) PADS, USE  DAILY, Disp: 200 each, Rfl: 0   amLODipine  (NORVASC ) 10 MG tablet, TAKE 1 TABLET EVERY DAY, Disp: 90 tablet, Rfl: 3   atorvastatin  (LIPITOR) 40 MG tablet, TAKE 1 TABLET EVERY DAY, Disp: 90 tablet, Rfl: 3   Blood Glucose Monitoring Suppl (TRUE METRIX METER) w/Device KIT, USE AS DIRECTED, Disp: 1 kit, Rfl: 0   fluticasone  (FLONASE ) 50 MCG/ACT nasal spray, Place 1 spray into both nostrils daily., Disp: 16 g, Rfl: 2   glucose blood (TRUE METRIX BLOOD GLUCOSE TEST) test strip, Test blood sugar bid, Disp: 200 strip, Rfl: 5   hydrALAZINE  (APRESOLINE ) 50 MG tablet, TAKE 1 TABLET THREE TIMES DAILY, Disp: 270 tablet, Rfl: 3   hydrochlorothiazide  (HYDRODIURIL ) 25 MG tablet, TAKE 1 TABLET EVERY DAY, Disp: 90 tablet, Rfl: 3   insulin  NPH Human (NOVOLIN N) 100 UNIT/ML injection, Inject 0.25 mLs (25 Units total) into the skin  at bedtime., Disp: 20 mL, Rfl: 11   insulin  regular (HUMULIN R ) 100 units/mL injection, Inject 0.25 mLs (25 Units total) into the skin 2 (two) times daily before a meal., Disp: 80 mL, Rfl: 11   lamoTRIgine  (LAMICTAL ) 100 MG tablet, Take 1 tablet (100 mg total) by mouth 2 (two) times daily., Disp: 180 tablet, Rfl: 4  sildenafil (VIAGRA) 100 MG tablet, Take 100 mg by mouth daily as needed. (Patient not taking: Reported on 07/19/2024), Disp: , Rfl:   "

## 2024-07-22 ENCOUNTER — Ambulatory Visit: Payer: Self-pay | Admitting: Family Medicine

## 2024-09-23 ENCOUNTER — Ambulatory Visit: Admitting: Diagnostic Neuroimaging

## 2025-01-16 ENCOUNTER — Ambulatory Visit: Admitting: Family Medicine

## 2025-07-21 ENCOUNTER — Ambulatory Visit
